# Patient Record
Sex: Male | Born: 1975 | Race: Black or African American | Hispanic: No | Marital: Single | Smoking: Current some day smoker
Health system: Southern US, Community
[De-identification: ages and names within clinical notes are randomized; demographics above are authoritative.]

## PROBLEM LIST (undated history)

## (undated) DIAGNOSIS — R05 Cough: Secondary | ICD-10-CM

## (undated) DIAGNOSIS — G4700 Insomnia, unspecified: Secondary | ICD-10-CM

## (undated) DIAGNOSIS — R569 Unspecified convulsions: Secondary | ICD-10-CM

## (undated) DIAGNOSIS — Z923 Personal history of irradiation: Secondary | ICD-10-CM

## (undated) DIAGNOSIS — Z86718 Personal history of other venous thrombosis and embolism: Secondary | ICD-10-CM

## (undated) DIAGNOSIS — C7931 Secondary malignant neoplasm of brain: Secondary | ICD-10-CM

## (undated) DIAGNOSIS — C801 Malignant (primary) neoplasm, unspecified: Secondary | ICD-10-CM

## (undated) DIAGNOSIS — C3490 Malignant neoplasm of unspecified part of unspecified bronchus or lung: Secondary | ICD-10-CM

## (undated) DIAGNOSIS — R0602 Shortness of breath: Secondary | ICD-10-CM

## (undated) DIAGNOSIS — Z9889 Other specified postprocedural states: Secondary | ICD-10-CM

## (undated) DIAGNOSIS — R059 Cough, unspecified: Secondary | ICD-10-CM

## (undated) HISTORY — DX: Malignant neoplasm of unspecified part of unspecified bronchus or lung: C34.90

## (undated) HISTORY — PX: MOUTH SURGERY: SHX715

## (undated) HISTORY — DX: Secondary malignant neoplasm of brain: C79.31

## (undated) HISTORY — DX: Malignant (primary) neoplasm, unspecified: C80.1

## (undated) HISTORY — DX: Personal history of irradiation: Z92.3

## (undated) HISTORY — DX: Other specified postprocedural states: Z98.890

## (undated) HISTORY — PX: FOOT SURGERY: SHX648

---

## 2012-04-13 DIAGNOSIS — Z9889 Other specified postprocedural states: Secondary | ICD-10-CM

## 2012-04-13 HISTORY — DX: Other specified postprocedural states: Z98.890

## 2012-04-14 ENCOUNTER — Emergency Department (HOSPITAL_COMMUNITY)
Admission: EM | Admit: 2012-04-14 | Discharge: 2012-04-15 | Disposition: A | Discharge status: Home / Self Care | Payer: 59 | Attending: Emergency Medicine | Admitting: Emergency Medicine

## 2012-04-14 ENCOUNTER — Encounter (HOSPITAL_COMMUNITY): Payer: Self-pay | Admitting: Emergency Medicine

## 2012-04-14 ENCOUNTER — Emergency Department (HOSPITAL_COMMUNITY): Payer: 59

## 2012-04-14 DIAGNOSIS — S0230XA Fracture of orbital floor, unspecified side, initial encounter for closed fracture: Secondary | ICD-10-CM | POA: Insufficient documentation

## 2012-04-14 DIAGNOSIS — IMO0002 Reserved for concepts with insufficient information to code with codable children: Secondary | ICD-10-CM | POA: Insufficient documentation

## 2012-04-14 DIAGNOSIS — Y939 Activity, unspecified: Secondary | ICD-10-CM | POA: Insufficient documentation

## 2012-04-14 DIAGNOSIS — R918 Other nonspecific abnormal finding of lung field: Secondary | ICD-10-CM

## 2012-04-14 DIAGNOSIS — R222 Localized swelling, mass and lump, trunk: Secondary | ICD-10-CM | POA: Insufficient documentation

## 2012-04-14 DIAGNOSIS — S02609B Fracture of mandible, unspecified, initial encounter for open fracture: Secondary | ICD-10-CM | POA: Insufficient documentation

## 2012-04-14 DIAGNOSIS — F17200 Nicotine dependence, unspecified, uncomplicated: Secondary | ICD-10-CM | POA: Insufficient documentation

## 2012-04-14 DIAGNOSIS — Y92009 Unspecified place in unspecified non-institutional (private) residence as the place of occurrence of the external cause: Secondary | ICD-10-CM | POA: Insufficient documentation

## 2012-04-14 MED ORDER — OXYCODONE-ACETAMINOPHEN 5-325 MG PO TABS
2.0000 | ORAL_TABLET | Freq: Once | ORAL | Status: AC
  Administered 2012-04-14: 2 via ORAL
  Filled 2012-04-14: qty 2
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
## 2012-04-15 ENCOUNTER — Emergency Department (HOSPITAL_COMMUNITY): Payer: 59

## 2012-04-15 LAB — POCT I-STAT, CHEM 8
Calcium, Ion: 1.19 mmol/L (ref 1.12–1.23)
Glucose, Bld: 98 mg/dL (ref 70–99)
HCT: 43 % (ref 39.0–52.0)
TCO2: 29 mmol/L (ref 0–100)

## 2012-04-15 MED ORDER — SODIUM CHLORIDE 0.9 % IV BOLUS (SEPSIS)
1000.0000 mL | Freq: Once | INTRAVENOUS | Status: AC
  Administered 2012-04-15: 1000 mL via INTRAVENOUS

## 2012-04-15 MED ORDER — ONDANSETRON HCL 4 MG/2ML IJ SOLN
4.0000 mg | Freq: Once | INTRAMUSCULAR | Status: AC
  Administered 2012-04-15: 4 mg via INTRAVENOUS
  Filled 2012-04-15: qty 2

## 2012-04-15 MED ORDER — CEPHALEXIN 500 MG PO CAPS: 500.0000 mg | ORAL_CAPSULE | Freq: Four times a day (QID) | ORAL | Status: DC

## 2012-04-15 MED ORDER — OXYCODONE-ACETAMINOPHEN 5-325 MG PO TABS: 1.0000 | ORAL_TABLET | Freq: Four times a day (QID) | ORAL | Status: DC | PRN

## 2012-04-15 MED ORDER — IOHEXOL 300 MG/ML  SOLN
80.0000 mL | Freq: Once | INTRAMUSCULAR | Status: AC | PRN
  Administered 2012-04-15: 80 mL via INTRAVENOUS

## 2012-04-15 MED ORDER — MORPHINE SULFATE 4 MG/ML IJ SOLN
4.0000 mg | Freq: Once | INTRAMUSCULAR | Status: AC
  Administered 2012-04-15: 4 mg via INTRAVENOUS
  Filled 2012-04-15: qty 1
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
## 2012-04-15 NOTE — ED Provider Notes (Signed)
Medical screening examination/treatment/procedure(s) were conducted as a shared visit with non-physician practitioner(s) and myself.  I personally evaluated the patient during the encounter  Please see my separate respective documentation pertaining to this patient encounter   Vida Roller, MD 04/15/12 [phone]

## 2012-04-16 ENCOUNTER — Other Ambulatory Visit: Payer: Self-pay | Admitting: Oncology

## 2012-04-22 ENCOUNTER — Telehealth: Payer: Self-pay | Admitting: *Deleted

## 2012-04-22 NOTE — Telephone Encounter (Signed)
Called pt with appt unable to leave message

## 2012-04-26 ENCOUNTER — Telehealth: Payer: Self-pay | Admitting: *Deleted

## 2012-04-26 NOTE — Telephone Encounter (Signed)
Spoke with pt regarding appt time and place.  He verbalized understanding 

## 2012-05-09 ENCOUNTER — Ambulatory Visit (INDEPENDENT_AMBULATORY_CARE_PROVIDER_SITE_OTHER): Payer: 59 | Admitting: Emergency Medicine

## 2012-05-09 ENCOUNTER — Encounter: Payer: Self-pay | Admitting: Emergency Medicine

## 2012-05-09 VITALS — BP 122/90 | HR 103 | Temp 98.7°F | Ht 71.0 in | Wt 136.6 lb

## 2012-05-09 DIAGNOSIS — R590 Localized enlarged lymph nodes: Secondary | ICD-10-CM

## 2012-05-09 DIAGNOSIS — R911 Solitary pulmonary nodule: Secondary | ICD-10-CM | POA: Insufficient documentation

## 2012-05-09 DIAGNOSIS — R599 Enlarged lymph nodes, unspecified: Secondary | ICD-10-CM

## 2012-05-09 MED ORDER — OXYCODONE-ACETAMINOPHEN 5-325 MG PO TABS: 1.0000 | ORAL_TABLET | Freq: Four times a day (QID) | ORAL | Status: DC | PRN
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
## 2012-05-09 NOTE — Assessment & Plan Note (Signed)
Should be able to reach by EBUS

## 2012-05-09 NOTE — Assessment & Plan Note (Addendum)
Suspicious for malignancy. Will attempt to make CT scan a superD study. He likely needs ENB and EBUS to get at the hilar nodes.  - will speak to his oral surgeon to see when it would be feasible to go to OR - will give a one-time script for roxicet - rov 1 month

## 2012-05-09 NOTE — Patient Instructions (Addendum)
We will discuss your case with Dr Chales Salmon regarding a possible bronchoscopy. We need to be sure you can open your mouth to have the procedure.  We will call you to set this up  We will discuss breathing testing at some point in the future You need to stop smoking A one-time script for pain medication was given for your chest and back.  Follow with Dr Delton Coombes in 1 month

## 2012-05-14 ENCOUNTER — Telehealth: Payer: Self-pay | Admitting: *Deleted

## 2012-05-14 ENCOUNTER — Telehealth: Payer: Self-pay | Admitting: Emergency Medicine

## 2012-05-14 NOTE — Telephone Encounter (Signed)
Dr. Mercer Pod office returned my phone call, and left msg with [facility]. Patient has OV with Dr. Chales Salmon Mar 10,2014 and Dr. Chales Salmon will see then about patient being able to do bronch/ett, would like Dr. Delton Coombes to hold off until then.  Will forward to RB as FYI

## 2012-05-14 NOTE — Telephone Encounter (Signed)
error 

## 2012-05-15 NOTE — Telephone Encounter (Signed)
Ok thank you 

## 2012-06-06 ENCOUNTER — Encounter: Payer: Self-pay | Admitting: Emergency Medicine

## 2012-06-06 ENCOUNTER — Ambulatory Visit (INDEPENDENT_AMBULATORY_CARE_PROVIDER_SITE_OTHER): Payer: 59 | Admitting: Emergency Medicine

## 2012-06-06 ENCOUNTER — Ambulatory Visit (INDEPENDENT_AMBULATORY_CARE_PROVIDER_SITE_OTHER)
Admission: RE | Admit: 2012-06-06 | Discharge: 2012-06-06 | Disposition: A | Discharge status: Home / Self Care | Payer: 59 | Source: Ambulatory Visit | Attending: Emergency Medicine | Admitting: Emergency Medicine

## 2012-06-06 VITALS — BP 138/98 | HR 85 | Temp 98.2°F | Ht 70.0 in | Wt 146.6 lb

## 2012-06-06 DIAGNOSIS — R911 Solitary pulmonary nodule: Secondary | ICD-10-CM

## 2012-06-06 NOTE — Assessment & Plan Note (Signed)
Discussed FOB + ENB and EBUS. I  believe this the best strategy to achieve tissue dx.  Will perform spiro prior to procedure to assess for occult AFL prior to anesthesia.  - need repeat CT scan, super D  - schedule procedure [facility]

## 2012-06-06 NOTE — Patient Instructions (Addendum)
We will repeat your CT scan of the chest  We will perform a bronchoscopy at [place]. This will be scheduled today Follow with Dr Delton Coombes in 1 month
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
## 2012-06-07 ENCOUNTER — Encounter (HOSPITAL_COMMUNITY): Payer: Self-pay | Admitting: Pharmacy Technician

## 2012-06-07 NOTE — Progress Notes (Signed)
MEDICATION RELATED CONSULT NOTE - INITIAL   Called Dr. Delton Coombes concerning patient's current pain medication regimen prior to his upcoming procedure with concerns of organ damage and bleeding complications. Dr. Delton Coombes very receptive and will check LFTs and clotting test prior to upcoming procedure.   Laqueisha Catalina S. Merilynn Finland, PharmD, BCPS Clinical Staff Pharmacist Pager [phone]

## 2012-06-11 DIAGNOSIS — C3490 Malignant neoplasm of unspecified part of unspecified bronchus or lung: Secondary | ICD-10-CM

## 2012-06-11 HISTORY — DX: Malignant neoplasm of unspecified part of unspecified bronchus or lung: C34.90

## 2012-06-18 ENCOUNTER — Encounter (HOSPITAL_COMMUNITY): Payer: Self-pay | Admitting: *Deleted

## 2012-06-18 NOTE — Progress Notes (Signed)
Pt doesn't have a cardiologist   Denies ever having an echo/stress test/heart cath  Doesn't have a Medical MD  Denies having an EKG in past yr

## 2012-06-19 ENCOUNTER — Ambulatory Visit (HOSPITAL_COMMUNITY)
Admission: RE | Admit: 2012-06-19 | Discharge: 2012-06-19 | Disposition: A | Discharge status: Home / Self Care | Payer: 59 | Source: Ambulatory Visit | Attending: Emergency Medicine | Admitting: Emergency Medicine

## 2012-06-19 ENCOUNTER — Encounter (HOSPITAL_COMMUNITY)
Admission: RE | Disposition: A | Discharge status: Home / Self Care | Payer: Self-pay | Source: Ambulatory Visit | Attending: Emergency Medicine

## 2012-06-19 ENCOUNTER — Encounter (HOSPITAL_COMMUNITY): Payer: Self-pay | Admitting: Anesthesiology

## 2012-06-19 ENCOUNTER — Encounter: Payer: Self-pay | Admitting: Emergency Medicine

## 2012-06-19 ENCOUNTER — Encounter (HOSPITAL_COMMUNITY): Payer: Self-pay | Admitting: Certified Registered"

## 2012-06-19 ENCOUNTER — Ambulatory Visit (HOSPITAL_COMMUNITY): Payer: 59

## 2012-06-19 ENCOUNTER — Ambulatory Visit (HOSPITAL_COMMUNITY): Payer: 59 | Admitting: Certified Registered"

## 2012-06-19 DIAGNOSIS — R911 Solitary pulmonary nodule: Secondary | ICD-10-CM | POA: Insufficient documentation

## 2012-06-19 DIAGNOSIS — R590 Localized enlarged lymph nodes: Secondary | ICD-10-CM

## 2012-06-19 DIAGNOSIS — C771 Secondary and unspecified malignant neoplasm of intrathoracic lymph nodes: Secondary | ICD-10-CM | POA: Insufficient documentation

## 2012-06-19 DIAGNOSIS — F17200 Nicotine dependence, unspecified, uncomplicated: Secondary | ICD-10-CM | POA: Insufficient documentation

## 2012-06-19 DIAGNOSIS — R599 Enlarged lymph nodes, unspecified: Secondary | ICD-10-CM

## 2012-06-19 HISTORY — PX: VIDEO BRONCHOSCOPY WITH ENDOBRONCHIAL ULTRASOUND: SHX6177

## 2012-06-19 HISTORY — DX: Shortness of breath: R06.02

## 2012-06-19 HISTORY — DX: Insomnia, unspecified: G47.00

## 2012-06-19 HISTORY — DX: Cough: R05

## 2012-06-19 HISTORY — DX: Cough, unspecified: R05.9

## 2012-06-19 LAB — CBC
HCT: 40.5 % (ref 39.0–52.0)
Platelets: 210 10*3/uL (ref 150–400)
RBC: 4.78 MIL/uL (ref 4.22–5.81)
RDW: 13.7 % (ref 11.5–15.5)
WBC: 6.4 10*3/uL (ref 4.0–10.5)

## 2012-06-19 LAB — BASIC METABOLIC PANEL
BUN: 18 mg/dL (ref 6–23)
CO2: 25 mEq/L (ref 19–32)
Chloride: 105 mEq/L (ref 96–112)
GFR calc Af Amer: >90 mL/min (ref >90)
Potassium: 3.9 mEq/L (ref 3.5–5.1)

## 2012-06-19 SURGERY — BRONCHOSCOPY, WITH EBUS
Anesthesia: General | Laterality: Right

## 2012-06-19 MED ORDER — OXYCODONE HCL 5 MG PO TABS: 5.0000 mg | ORAL_TABLET | Freq: Once | ORAL | Status: DC | PRN

## 2012-06-19 MED ORDER — NEOSTIGMINE METHYLSULFATE 1 MG/ML IJ SOLN
INTRAMUSCULAR | Status: DC | PRN
  Administered 2012-06-19: 3 mg via INTRAVENOUS

## 2012-06-19 MED ORDER — OXYCODONE HCL 5 MG/5ML PO SOLN: 5.0000 mg | Freq: Once | ORAL | Status: DC | PRN

## 2012-06-19 MED ORDER — ONDANSETRON HCL 4 MG/2ML IJ SOLN
INTRAMUSCULAR | Status: DC | PRN
  Administered 2012-06-19: 4 mg via INTRAVENOUS

## 2012-06-19 MED ORDER — LIDOCAINE HCL (CARDIAC) 20 MG/ML IV SOLN
INTRAVENOUS | Status: DC | PRN
  Administered 2012-06-19: 60 mg via INTRAVENOUS

## 2012-06-19 MED ORDER — MIDAZOLAM HCL 5 MG/5ML IJ SOLN
INTRAMUSCULAR | Status: DC | PRN
  Administered 2012-06-19: 2 mg via INTRAVENOUS

## 2012-06-19 MED ORDER — DEXAMETHASONE SODIUM PHOSPHATE 4 MG/ML IJ SOLN
INTRAMUSCULAR | Status: DC | PRN
  Administered 2012-06-19: 8 mg via INTRAVENOUS

## 2012-06-19 MED ORDER — PROPOFOL 10 MG/ML IV BOLUS
INTRAVENOUS | Status: DC | PRN
  Administered 2012-06-19: 200 mg via INTRAVENOUS

## 2012-06-19 MED ORDER — ARTIFICIAL TEARS OP OINT
TOPICAL_OINTMENT | OPHTHALMIC | Status: DC | PRN
  Administered 2012-06-19: 1 via OPHTHALMIC

## 2012-06-19 MED ORDER — HYDROMORPHONE HCL PF 1 MG/ML IJ SOLN: 0.2500 mg | INTRAMUSCULAR | Status: DC | PRN

## 2012-06-19 MED ORDER — LACTATED RINGERS IV SOLN
INTRAVENOUS | Status: DC | PRN
  Administered 2012-06-19: 08:00:00 via INTRAVENOUS

## 2012-06-19 MED ORDER — PROPOFOL INFUSION 10 MG/ML OPTIME
INTRAVENOUS | Status: DC | PRN
  Administered 2012-06-19: 50 ug/kg/min via INTRAVENOUS

## 2012-06-19 MED ORDER — GLYCOPYRROLATE 0.2 MG/ML IJ SOLN
INTRAMUSCULAR | Status: DC | PRN
  Administered 2012-06-19: 0.4 mg via INTRAVENOUS

## 2012-06-19 MED ORDER — ROCURONIUM BROMIDE 100 MG/10ML IV SOLN
INTRAVENOUS | Status: DC | PRN
  Administered 2012-06-19: 40 mg via INTRAVENOUS

## 2012-06-19 MED ORDER — FENTANYL CITRATE 0.05 MG/ML IJ SOLN
INTRAMUSCULAR | Status: DC | PRN
  Administered 2012-06-19: 100 ug via INTRAVENOUS

## 2012-06-19 MED ORDER — LIDOCAINE HCL 4 % MT SOLN
OROMUCOSAL | Status: DC | PRN
  Administered 2012-06-19: 4 mL via TOPICAL

## 2012-06-19 MED ORDER — IBUPROFEN 200 MG PO TABS: 600.0000 mg | ORAL_TABLET | Freq: Four times a day (QID) | ORAL | Status: DC | PRN

## 2012-06-19 MED ORDER — OXYCODONE HCL 5 MG PO TABS: 5.0000 mg | ORAL_TABLET | ORAL | Status: DC | PRN

## 2012-06-19 SURGICAL SUPPLY — 23 items
BRUSH CYTOL CELLEBRITY 1.5X140 (MISCELLANEOUS) IMPLANT
CANISTER SUCTION 2500CC (MISCELLANEOUS) ×2 IMPLANT
CLOTH BEACON ORANGE TIMEOUT ST (SAFETY) ×2 IMPLANT
CONT SPEC 4OZ CLIKSEAL STRL BL (MISCELLANEOUS) ×2 IMPLANT
COVER TABLE BACK 60X90 (DRAPES) ×2 IMPLANT
FORCEPS BIOP RJ4 1.8 (CUTTING FORCEPS) IMPLANT
GLOVE BIOGEL M STRL SZ7.5 (GLOVE) ×4 IMPLANT
GLOVE SURG SS PI 7.0 STRL IVOR (GLOVE) ×2 IMPLANT
GOWN STRL NON-REIN LRG LVL3 (GOWN DISPOSABLE) ×4 IMPLANT
KIT ROOM TURNOVER OR (KITS) ×2 IMPLANT
MARKER SKIN DUAL TIP RULER LAB (MISCELLANEOUS) ×2 IMPLANT
NEEDLE BIOPSY TRANSBRONCH 21G (NEEDLE) IMPLANT
NEEDLE SYS SONOTIP II EBUSTBNA (NEEDLE) ×4 IMPLANT
NS IRRIG 1000ML POUR BTL (IV SOLUTION) ×2 IMPLANT
OIL SILICONE PENTAX (PARTS (SERVICE/REPAIRS)) ×2 IMPLANT
PAD ARMBOARD 7.5X6 YLW CONV (MISCELLANEOUS) ×4 IMPLANT
SPONGE GAUZE 4X4 12PLY (GAUZE/BANDAGES/DRESSINGS) ×2 IMPLANT
SYR 20CC LL (SYRINGE) ×2 IMPLANT
SYR 20ML ECCENTRIC (SYRINGE) ×4 IMPLANT
SYR 5ML LUER SLIP (SYRINGE) ×2 IMPLANT
TOWEL OR 17X24 6PK STRL BLUE (TOWEL DISPOSABLE) ×2 IMPLANT
TRAP SPECIMEN MUCOUS 40CC (MISCELLANEOUS) ×2 IMPLANT
TUBE CONNECTING 12X1/4 (SUCTIONS) ×4 IMPLANT

## 2012-06-19 NOTE — Op Note (Signed)
Video Bronchoscopy with Endobronchial Ultrasound Procedure Note  Date of Operation: 06/19/2012  Pre-op Diagnosis: right upper lobe nodule with mediastinal lymphadenopathy  Post-op Diagnosis: same  Surgeon: Levy Pupa  Assistants: none  Anesthesia: General endotracheal anesthesia  Operation: Flexible video fiberoptic bronchoscopy with endobronchial ultrasound and biopsies.  Estimated Blood Loss: 25cc  Complications: none apparent  Indications and History: Terrance Clark is a 37 y.o. male with history of tobacco use, marijuana use, chronic back pain. He was referred for evaluation of an abnormal CT scan of the chest that showed a spiculated right apical nodule and mediastinal lymphadenopathy. Decision was made to pursue tissue diagnosis by video bronchoscopy with endobronchial ultrasound and biopsies.  The risks, benefits, complications, treatment options and expected outcomes were discussed with the patient.  The possibilities of pneumothorax, pneumonia, reaction to medication, pulmonary aspiration, perforation of a viscus, bleeding, failure to diagnose a condition and creating a complication requiring transfusion or operation were discussed with the patient who freely signed the consent.    Description of Procedure: The patient was examined in the preoperative area and history and data from the preprocedure consultation were reviewed. It was deemed appropriate to proceed.  The patient was taken to OR 10, identified as Terrance Clark and the procedure verified as Flexible Video Fiberoptic Bronchoscopy.  A Time Out was held and the above information confirmed. General anesthesia was initiated and the patient was orally intubated. The video fiberoptic bronchoscope was introduced via the endotracheal tube and a general inspection was performed which showed normal airways. The standard scope was then withdrawn and the endobronchial ultrasound was used to identify and characterize the peritracheal,  hilar and bronchial lymph nodes. Inspection showed significant enlargement of the station 4 R. And station 7 lymph nodes. The station 10 or lymph node was also enlarged to a lesser degree. The echotexture of all 3 nodes was abnormal with some apparent vascular structures. This was confirmed by Doppler ultrasound with evidence for small vessels coursing through the nodal tissue. Using real-time ultrasound guidance Wang needle biopsies were take from Station 4 R, 7 and 10 R nodes and were sent for cytology. Consistent with the vascular structures noted on upper ultrasound there was blood obtained on the Wang needle biopsies which would be atypical in normal nodal tissue. The patient tolerated the procedure well without apparent complications. There was no significant blood loss. The bronchoscope was withdrawn. Anesthesia was reversed and the patient was taken to the PACU for recovery.   Samples: 1. Wang needle biopsies from 4 R node 2. Wang needle biopsies from 7 node 3. Wang needle biopsies from 10 R node  Plans:  The patient will be discharged from the PACU to home when recovered from anesthesia. We will review the cytology results with the patient when they become available. Outpatient followup will be with Dr Delton Coombes.   Levy Pupa, MD, PhD 06/19/2012, 10:15 AM [facility] [phone] or if no answer [phone]

## 2012-06-19 NOTE — Preoperative (Signed)
Beta Blockers   Reason not to administer Beta Blockers:Not Applicable 
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
## 2012-06-19 NOTE — Anesthesia Preprocedure Evaluation (Addendum)
Anesthesia Evaluation  Patient identified by MRN, date of birth, ID band Patient awake    Reviewed: Allergy & Precautions, H&P , NPO status , Patient's Chart, lab work & pertinent test results  Airway Mallampati: II TM Distance: >3 FB Neck ROM: Full    Dental no notable dental hx. (+) Chipped, Teeth Intact and Dental Advisory Given,    Pulmonary shortness of breath, Current Smoker,  06-19-12 Chest X ray Comparison: CT chest 06/06/2012.   Findings: Trachea is midline.  Heart size normal.  Spiculated nodule in the apical right upper lobe is not well visualized. Lungs are hyperinflated but otherwise clear.  No pleural fluid.   IMPRESSION: Known spiculated right upper lobe nodule is poorly visualized.        Pulmonary exam normal       Cardiovascular Exercise Tolerance: Good negative cardio ROS  Rhythm:Regular     Neuro/Psych negative neurological ROS  negative psych ROS   GI/Hepatic negative GI ROS, Neg liver ROS,   Endo/Other  negative endocrine ROS  Renal/GU negative Renal ROS  negative genitourinary   Musculoskeletal negative musculoskeletal ROS (+)   Abdominal   Peds  Hematology negative hematology ROS (+)   Anesthesia Other Findings Gum Disease. Caries present.  Reproductive/Obstetrics negative OB ROS                        Anesthesia Physical Anesthesia Plan  ASA: II  Anesthesia Plan: General   Post-op Pain Management:    Induction: Intravenous  Airway Management Planned: Oral ETT  Additional Equipment:   Intra-op Plan:   Post-operative Plan: Extubation in OR  Informed Consent: I have reviewed the patients History and Physical, chart, labs and discussed the procedure including the risks, benefits and alternatives for the proposed anesthesia with the patient or authorized representative who has indicated his/her understanding and acceptance.   Dental advisory  given  Plan Discussed with: CRNA and Surgeon  Anesthesia Plan Comments:         Anesthesia Quick Evaluation

## 2012-06-19 NOTE — Interval H&P Note (Signed)
PCCM Note  Pt presents for EBUS. His RUL nodule is apical, in difficult position for navigation - no pathway available. He continues to have R chest and back pain to r shoulder. He continues to take high-dose ibuprofen, more than prescribed. He also uses Goody's Powders and BC Powders when he wants. He is not using tylenol at this time.   Filed Vitals:   06/19/12 0722  BP: 127/87  Pulse: 81  Temp: 98.2 F (36.8 C)  Resp: 20

## 2012-06-19 NOTE — Anesthesia Postprocedure Evaluation (Signed)
  Anesthesia Post-op Note  Patient: Terrance Clark  Procedure(s) Performed: Procedure(s): VIDEO BRONCHOSCOPY WITH ENDOBRONCHIAL ULTRASOUND (Right)  Patient Location: PACU  Anesthesia Type:General  Level of Consciousness: awake and alert   Airway and Oxygen Therapy: Patient Spontanous Breathing  Post-op Pain: mild  Post-op Assessment: Post-op Vital signs reviewed, Patient's Cardiovascular Status Stable, Respiratory Function Stable, Patent Airway and No signs of Nausea or vomiting  Post-op Vital Signs: Reviewed and stable  Complications: No apparent anesthesia complications

## 2012-06-19 NOTE — Interval H&P Note (Signed)
PLAN:  - will proceed with EBUS for nodal bx's, defer the ENB component of the procedure - will need to further w/u his significant CP; no reason to suspect that the CP is related to the nodule or to his LAD. He likely needs EGD >> at risk GU given the heavy ibuprofen use.  Levy Pupa, MD, PhD 06/19/2012, 8:40 AM [facility] [phone] or if no answer [phone]

## 2012-06-19 NOTE — Transfer of Care (Signed)
Immediate Anesthesia Transfer of Care Note  Patient: Terrance Clark  Procedure(s) Performed: Procedure(s): VIDEO BRONCHOSCOPY WITH ENDOBRONCHIAL ULTRASOUND (Right)  Patient Location: PACU  Anesthesia Type:General  Level of Consciousness: awake, alert , oriented and patient cooperative  Airway & Oxygen Therapy: Patient Spontanous Breathing and Patient connected to nasal cannula oxygen  Post-op Assessment: Report given to PACU RN and Post -op Vital signs reviewed and stable  Post vital signs: Reviewed and stable  Complications: No apparent anesthesia complications

## 2012-06-19 NOTE — Progress Notes (Signed)
Patient states he took 18 ibuprofen on yesterday due to the pain in his chest from the nodule. Rates pain a 8. Discussed safety with patient of taking too many ibuprofen. Dr. Sampson Goon aware.

## 2012-06-19 NOTE — Anesthesia Procedure Notes (Signed)
Procedure Name: Intubation Date/Time: 06/19/2012 8:57 AM Performed by: Tyrone Nine Pre-anesthesia Checklist: Patient identified, Timeout performed, Emergency Drugs available, Suction available and Patient being monitored Patient Re-evaluated:Patient Re-evaluated prior to inductionOxygen Delivery Method: Circle system utilized Preoxygenation: Pre-oxygenation with 100% oxygen Intubation Type: IV induction Ventilation: Oral airway inserted - appropriate to patient size and Mask ventilation without difficulty Tube size: 9.0 mm Number of attempts: 1 Airway Equipment and Method: Stylet Placement Confirmation: ETT inserted through vocal cords under direct vision,  breath sounds checked- equal and bilateral and positive ETCO2 Secured at: 23 cm Tube secured with: Tape Dental Injury: Teeth and Oropharynx as per pre-operative assessment

## 2012-06-20 ENCOUNTER — Encounter (HOSPITAL_COMMUNITY): Payer: Self-pay | Admitting: Emergency Medicine

## 2012-06-21 ENCOUNTER — Telehealth: Payer: Self-pay | Admitting: Emergency Medicine

## 2012-06-21 DIAGNOSIS — C3490 Malignant neoplasm of unspecified part of unspecified bronchus or lung: Secondary | ICD-10-CM

## 2012-06-21 NOTE — Telephone Encounter (Signed)
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 He understands the dx. I will refer him to [facility] to determine further management. Pt knows to expect call from our office about date and time.

## 2012-06-21 NOTE — Telephone Encounter (Signed)
Dr Delton Coombes,  Please advise pt on bronch results.

## 2012-06-24 ENCOUNTER — Telehealth: Payer: Self-pay | Admitting: Emergency Medicine

## 2012-06-24 NOTE — Telephone Encounter (Signed)
I spoke with pt. He awaiting a call back about appt for MTOC. He has not bene told anything. Please advise PCC's thanks

## 2012-06-24 NOTE — Telephone Encounter (Signed)
Spoke to pt appt 07/04/12@2 :00pm  Tobe Sos

## 2012-06-25 NOTE — Telephone Encounter (Signed)
See note 06/24/12

## 2012-06-28 ENCOUNTER — Telehealth: Payer: Self-pay | Admitting: *Deleted

## 2012-06-28 NOTE — Telephone Encounter (Signed)
Called pt regarding appt for mtoc and unable to leave message

## 2012-07-01 ENCOUNTER — Telehealth: Payer: Self-pay | Admitting: Emergency Medicine

## 2012-07-01 ENCOUNTER — Telehealth: Payer: Self-pay | Admitting: *Deleted

## 2012-07-01 NOTE — Telephone Encounter (Signed)
Tried to leave message for pt regarding appt unable.

## 2012-07-01 NOTE — Telephone Encounter (Signed)
Ok thank you 

## 2012-07-01 NOTE — Telephone Encounter (Signed)
Will forward to RB as FYI 

## 2012-07-02 ENCOUNTER — Telehealth: Payer: Self-pay | Admitting: *Deleted

## 2012-07-02 NOTE — Telephone Encounter (Signed)
Spoke with pt regarding appt for mtoc 07/04/12 at 2:00 arrive at 1:45.  He verbalized understanding of time and place of appt

## 2012-07-04 ENCOUNTER — Telehealth: Payer: Self-pay | Admitting: Internal Medicine

## 2012-07-04 ENCOUNTER — Ambulatory Visit (HOSPITAL_BASED_OUTPATIENT_CLINIC_OR_DEPARTMENT_OTHER): Payer: 59 | Admitting: Internal Medicine

## 2012-07-04 ENCOUNTER — Encounter: Payer: Self-pay | Admitting: *Deleted

## 2012-07-04 ENCOUNTER — Telehealth: Payer: Self-pay | Admitting: *Deleted

## 2012-07-04 ENCOUNTER — Encounter: Payer: Self-pay | Admitting: Radiation Oncology

## 2012-07-04 ENCOUNTER — Encounter: Payer: Self-pay | Admitting: Internal Medicine

## 2012-07-04 ENCOUNTER — Ambulatory Visit
Admission: RE | Admit: 2012-07-04 | Discharge: 2012-07-04 | Disposition: A | Discharge status: Home / Self Care | Payer: 59 | Source: Ambulatory Visit | Attending: Radiation Oncology | Admitting: Radiation Oncology

## 2012-07-04 VITALS — BP 152/93 | HR 72 | Temp 97.6°F | Resp 18 | Ht 70.0 in | Wt 146.0 lb

## 2012-07-04 DIAGNOSIS — C3491 Malignant neoplasm of unspecified part of right bronchus or lung: Secondary | ICD-10-CM

## 2012-07-04 DIAGNOSIS — C3490 Malignant neoplasm of unspecified part of unspecified bronchus or lung: Secondary | ICD-10-CM | POA: Insufficient documentation

## 2012-07-04 DIAGNOSIS — C3410 Malignant neoplasm of upper lobe, unspecified bronchus or lung: Secondary | ICD-10-CM

## 2012-07-04 NOTE — Telephone Encounter (Signed)
advised pt on 5.5.14 time change

## 2012-07-04 NOTE — Progress Notes (Signed)
   Thoracic Treatment Summary Name:Terrance Clark Date:07/04/12 DOB:1975/06/29 Your Medical Team Medical Oncologist:Dr. Arbutus Ped Radiation Oncologist:Dr. Kinard Pulmonologist:Dr. 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  Pathological stage will be determined after surgery.  Staging is based on the size of the tumor, involvement of lymph nodes or not, and whether or not the cancer center has spread. Recommendations Recommendations:Concurrent chemoradiation therapy.    These recommendations are based on information available as of today's consult.  This is subject to change depending further testing or exams. Next Steps Next Step: 1. Appointment 07/09/12 at 9:30 for radiation at [facility] 2. Cancer Center will call with appointment for chemo education class and Dr. Arbutus Ped  Barriers to Care What do you perceive as a potential barrier that may prevent you from receiving your treatment plan? Pt does not perceive any barriers to care at this time.  Information given and explained regarding lung cancer and supportive services at the Cancer Center Questions Willette Pa, RN BSN Thoracic Oncology Nurse Navigator at [phone]  Terrance Clark is a nurse navigator that is available to assist you through your cancer journey.  She can answer your questions and/or provide resources regarding your treatment plan, emotional support, or financial concerns.
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
## 2012-07-04 NOTE — Telephone Encounter (Signed)
Per staff message and POF I have scheduled appts.  JMW  

## 2012-07-04 NOTE — Telephone Encounter (Signed)
Terrance Clark and advised about 4.28.14 MRI, 5.1 chemo edu and 5.5.Marland KitchenMarland Kitchenpt ok and aware
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
## 2012-07-07 NOTE — Patient Instructions (Signed)
You are recently diagnosed with a stage IIIA non-small cell lung cancer. We discussed treatment options including concurrent chemotherapy and radiation.

## 2012-07-08 ENCOUNTER — Ambulatory Visit
Admission: RE | Admit: 2012-07-08 | Discharge: 2012-07-08 | Disposition: A | Discharge status: Home / Self Care | Payer: 59 | Source: Ambulatory Visit | Attending: Internal Medicine | Admitting: Internal Medicine

## 2012-07-08 DIAGNOSIS — C3491 Malignant neoplasm of unspecified part of right bronchus or lung: Secondary | ICD-10-CM

## 2012-07-08 MED ORDER — GADOBENATE DIMEGLUMINE 529 MG/ML IV SOLN
14.0000 mL | Freq: Once | INTRAVENOUS | Status: AC | PRN
  Administered 2012-07-08: 14 mL via INTRAVENOUS

## 2012-07-09 ENCOUNTER — Ambulatory Visit
Admission: RE | Admit: 2012-07-09 | Discharge: 2012-07-09 | Disposition: A | Discharge status: Home / Self Care | Payer: 59 | Source: Ambulatory Visit | Attending: Radiation Oncology | Admitting: Radiation Oncology

## 2012-07-09 ENCOUNTER — Other Ambulatory Visit: Payer: 59

## 2012-07-09 ENCOUNTER — Encounter: Payer: Self-pay | Admitting: Radiation Oncology

## 2012-07-09 ENCOUNTER — Other Ambulatory Visit: Payer: Self-pay | Admitting: Radiation Therapy

## 2012-07-09 VITALS — BP 163/104 | HR 77 | Temp 97.0°F | Resp 16 | Ht 71.0 in | Wt 145.2 lb

## 2012-07-09 DIAGNOSIS — K208 Other esophagitis without bleeding: Secondary | ICD-10-CM | POA: Insufficient documentation

## 2012-07-09 DIAGNOSIS — R042 Hemoptysis: Secondary | ICD-10-CM | POA: Insufficient documentation

## 2012-07-09 DIAGNOSIS — C7931 Secondary malignant neoplasm of brain: Secondary | ICD-10-CM | POA: Insufficient documentation

## 2012-07-09 DIAGNOSIS — C3491 Malignant neoplasm of unspecified part of right bronchus or lung: Secondary | ICD-10-CM

## 2012-07-09 DIAGNOSIS — R634 Abnormal weight loss: Secondary | ICD-10-CM | POA: Insufficient documentation

## 2012-07-09 DIAGNOSIS — C3490 Malignant neoplasm of unspecified part of unspecified bronchus or lung: Secondary | ICD-10-CM | POA: Insufficient documentation

## 2012-07-09 DIAGNOSIS — R5383 Other fatigue: Secondary | ICD-10-CM | POA: Insufficient documentation

## 2012-07-09 DIAGNOSIS — Z79899 Other long term (current) drug therapy: Secondary | ICD-10-CM | POA: Insufficient documentation

## 2012-07-09 DIAGNOSIS — R42 Dizziness and giddiness: Secondary | ICD-10-CM | POA: Insufficient documentation

## 2012-07-09 DIAGNOSIS — Z510 Encounter for antineoplastic radiation therapy: Secondary | ICD-10-CM | POA: Insufficient documentation

## 2012-07-09 DIAGNOSIS — Y842 Radiological procedure and radiotherapy as the cause of abnormal reaction of the patient, or of later complication, without mention of misadventure at the time of the procedure: Secondary | ICD-10-CM | POA: Insufficient documentation

## 2012-07-09 DIAGNOSIS — C7949 Secondary malignant neoplasm of other parts of nervous system: Secondary | ICD-10-CM

## 2012-07-09 DIAGNOSIS — J029 Acute pharyngitis, unspecified: Secondary | ICD-10-CM | POA: Insufficient documentation

## 2012-07-09 DIAGNOSIS — R5381 Other malaise: Secondary | ICD-10-CM | POA: Insufficient documentation

## 2012-07-09 DIAGNOSIS — R1310 Dysphagia, unspecified: Secondary | ICD-10-CM | POA: Insufficient documentation

## 2012-07-09 DIAGNOSIS — R112 Nausea with vomiting, unspecified: Secondary | ICD-10-CM | POA: Insufficient documentation

## 2012-07-09 NOTE — Progress Notes (Signed)
Patient presented to the clinic today accompanied by his brother for nurse evaluation prior to ct/simulation. Patient alert and oriented to person, place, and time. No distress noted. Steady gait noted. Pleasant affect noted. Patient reports right upper chest pain 5 on a scale of 0-10 for which he takes motrin. Patient denies cough, shortness of breath, or hemoptysis. Patient denies difficulty swallowing. Patient denies headache, dizziness, diplopia, nausea, or vomiting. Patient seen by Dr. Roselind Messier to review MRI results. Reported all findings to Dr. Roselind Messier.

## 2012-07-09 NOTE — Progress Notes (Signed)
Met with patient to discuss RO billing.  Patient had no concerns today;however, if he decides he needs FMLA, disability paperwork, will get back with me at a later date.

## 2012-07-09 NOTE — Addendum Note (Signed)
Encounter addended by: Delynn Flavin, RN on: 07/09/2012 10:47 AM<BR>     Documentation filed: Charges VN

## 2012-07-10 ENCOUNTER — Ambulatory Visit (HOSPITAL_COMMUNITY)
Admission: RE | Admit: 2012-07-10 | Discharge: 2012-07-10 | Disposition: A | Discharge status: Home / Self Care | Payer: 59 | Source: Ambulatory Visit | Attending: Internal Medicine | Admitting: Internal Medicine

## 2012-07-10 ENCOUNTER — Ambulatory Visit
Admission: RE | Admit: 2012-07-10 | Discharge: 2012-07-10 | Disposition: A | Discharge status: Home / Self Care | Payer: 59 | Source: Ambulatory Visit | Attending: Radiation Oncology | Admitting: Radiation Oncology

## 2012-07-10 DIAGNOSIS — R911 Solitary pulmonary nodule: Secondary | ICD-10-CM | POA: Insufficient documentation

## 2012-07-10 DIAGNOSIS — C771 Secondary and unspecified malignant neoplasm of intrathoracic lymph nodes: Secondary | ICD-10-CM | POA: Insufficient documentation

## 2012-07-10 DIAGNOSIS — C3491 Malignant neoplasm of unspecified part of right bronchus or lung: Secondary | ICD-10-CM

## 2012-07-10 DIAGNOSIS — C7931 Secondary malignant neoplasm of brain: Secondary | ICD-10-CM

## 2012-07-10 DIAGNOSIS — C3490 Malignant neoplasm of unspecified part of unspecified bronchus or lung: Secondary | ICD-10-CM | POA: Insufficient documentation

## 2012-07-10 DIAGNOSIS — C770 Secondary and unspecified malignant neoplasm of lymph nodes of head, face and neck: Secondary | ICD-10-CM | POA: Insufficient documentation

## 2012-07-10 LAB — GLUCOSE, CAPILLARY: Glucose-Capillary: 95 mg/dL (ref 70–99)

## 2012-07-10 MED ORDER — FLUDEOXYGLUCOSE F - 18 (FDG) INJECTION
19.8000 | Freq: Once | INTRAVENOUS | Status: AC | PRN
  Administered 2012-07-10: 19.8 via INTRAVENOUS

## 2012-07-10 MED ORDER — GADOBENATE DIMEGLUMINE 529 MG/ML IV SOLN
14.0000 mL | Freq: Once | INTRAVENOUS | Status: AC | PRN
  Administered 2012-07-10: 14 mL via INTRAVENOUS

## 2012-07-10 NOTE — Progress Notes (Signed)
  Radiation Oncology         [phone]) [phone] ________________________________  Name: Terrance Clark MRN: [id number]  Date: 07/09/2012  DOB: 1975-07-07  SIMULATION AND TREATMENT PLANNING NOTE  DIAGNOSIS:  Clinical stage III non-small cell lung  NARRATIVE:  The patient was brought to the CT Simulation planning suite.  Identity was confirmed.  All relevant records and images related to the planned course of therapy were reviewed.  The patient freely provided informed written consent to proceed with treatment after reviewing the details related to the planned course of therapy. The consent form was witnessed and verified by the simulation staff.  Then, the patient was set-up in a stable reproducible  supine position for radiation therapy.  CT images were obtained.  Surface markings were placed.  The CT images were loaded into the planning software.  Then the target and avoidance structures were contoured.  Treatment planning then occurred.  The radiation prescription was entered and confirmed.  Then, I designed and supervised the construction of a total of 1 medically necessary complex treatment devices.  I have requested : Intensity Modulated Radiotherapy (IMRT) is medically necessary for this case for the following reason:  Spinal cord adjacent to treatment area.  I have ordered:dose calc.  PLAN:  The patient will receive 63 Gy in 35 fractions.  ________________________________   Special treatment procedure note  The patient will be receiving radiosensitizing chemotherapy throughout his course of treatment. Given the increased potential for toxicities as well as the necessity for close monitoring of the patient and bloodwork, this constitutes a special treatment procedure. -----------------------------------  Billie Lade, PhD, MD

## 2012-07-11 ENCOUNTER — Ambulatory Visit
Admission: RE | Admit: 2012-07-11 | Discharge: 2012-07-11 | Disposition: A | Discharge status: Home / Self Care | Payer: 59 | Source: Ambulatory Visit | Attending: Radiation Oncology | Admitting: Radiation Oncology

## 2012-07-11 ENCOUNTER — Encounter: Payer: Self-pay | Admitting: *Deleted

## 2012-07-11 ENCOUNTER — Encounter: Payer: Self-pay | Admitting: Radiation Oncology

## 2012-07-11 ENCOUNTER — Other Ambulatory Visit: Payer: 59

## 2012-07-11 VITALS — BP 140/104 | HR 100 | Temp 98.7°F | Resp 20

## 2012-07-11 DIAGNOSIS — C7949 Secondary malignant neoplasm of other parts of nervous system: Secondary | ICD-10-CM | POA: Insufficient documentation

## 2012-07-11 DIAGNOSIS — C3490 Malignant neoplasm of unspecified part of unspecified bronchus or lung: Secondary | ICD-10-CM | POA: Insufficient documentation

## 2012-07-11 DIAGNOSIS — C7931 Secondary malignant neoplasm of brain: Secondary | ICD-10-CM

## 2012-07-11 DIAGNOSIS — C3491 Malignant neoplasm of unspecified part of right bronchus or lung: Secondary | ICD-10-CM

## 2012-07-11 HISTORY — DX: Secondary malignant neoplasm of brain: C79.31

## 2012-07-11 MED ORDER — OXYCODONE HCL 5 MG PO TABS: 5.0000 mg | ORAL_TABLET | ORAL | Status: DC | PRN

## 2012-07-11 NOTE — Progress Notes (Unsigned)
Location/Histology of Brain Tumor: 2right parietal , 1right posterior cortex Patient presented with symptoms of:  Chest pain/dioscomfort Past or anticipated interventions, if any, per neurosurgeryno Past or anticipated interventions, if any, per medical oncology:no Dose of Decadron, if applicable: none  Recent neurologic symptoms, if any:   Seizures: no  Headaches: no  Nausea: no  Dizziness/ataxia: no  Difficulty with hand coordination: no  Focal numbness/weakness: no  Visual deficits/changes: no  Confusion/Memory deficits: no  Painful bone metastases at present, if any: n, new dx Lung cancer, with brain mets  SAFETY ISSUES:  Prior radiation? no  Pacemaker/ICD? no  Possible current pregnancy? n/a  Is the patient on methotrexate? no  Additional Complaints / other details: new dx Lung cancer 06/19/12, 07/11/11 MRI 3 brain metastases, c/o chest discomfort only takes Ibuprofen, ran out of OXY IR 5mg  prn,  NKDA
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
## 2012-07-11 NOTE — Progress Notes (Signed)
Location/Histology of Brain Tumor:   Patient presented with symptoms of:    Past or anticipated interventions, if any, per neurosurgery:   Past or anticipated interventions, if any, per medical oncology:   Dose of Decadron, if applicable: no Recent neurologic symptoms, if any:   Seizures:   Headaches:   Nausea:   Dizziness/ataxia:   Difficulty with hand coordination:   Focal numbness/weakness:   Visual deficits/changes:   Confusion/Memory deficits:    Painful bone metastases at present, if any:   SAFETY ISSUES:  Prior radiation? No   Pacemaker/ICD? no  Possible current pregnancy? n/a  Is the patient on methotrexate? no  Additional Complaints / other details: new dx lung cancer with brain mets, anxiety, has chest pain, only taking ibuprofen, ran out of oxy IR5mg 

## 2012-07-12 ENCOUNTER — Ambulatory Visit
Admission: RE | Admit: 2012-07-12 | Discharge: 2012-07-12 | Disposition: A | Discharge status: Home / Self Care | Payer: 59 | Source: Ambulatory Visit | Attending: Radiation Oncology | Admitting: Radiation Oncology

## 2012-07-12 DIAGNOSIS — C7931 Secondary malignant neoplasm of brain: Secondary | ICD-10-CM

## 2012-07-12 DIAGNOSIS — C7949 Secondary malignant neoplasm of other parts of nervous system: Secondary | ICD-10-CM

## 2012-07-12 MED ORDER — SODIUM CHLORIDE 0.9 % IJ SOLN
10.0000 mL | Freq: Once | INTRAMUSCULAR | Status: AC
  Administered 2012-07-12: 10 mL via INTRAVENOUS

## 2012-07-12 NOTE — Progress Notes (Signed)
  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  NARRATIVE:  The patient was brought to the CT Simulation planning suite.  Identity was confirmed.  All relevant records and images related to the planned course of therapy were reviewed.  The patient freely provided informed written consent to proceed with treatment after reviewing the details related to the planned course of therapy. The consent form was witnessed and verified by the simulation staff. Intravenous access was established for contrast administration. Then, the patient was set-up in a stable reproducible supine position for radiation therapy.  A relocatable thermoplastic stereotactic head frame was fabricated for precise immobilization.  CT images were obtained.  Surface markings were placed.  The CT images were loaded into the planning software and fused with the patient's targeting MRI scan.  Then the target and avoidance structures were contoured.  Treatment planning then occurred.  The radiation prescription was entered and confirmed.  I have requested 3D planning  I have requested a DVH of the following structures: Brain stem, brain, left eye, right I, lenses, optic chiasm, target volumes, uninvolved brain, and normal tissue.    PLAN:  The patient will receive 20 Gy in one fraction.  ________________________________  [name], M.D.

## 2012-07-12 NOTE — Progress Notes (Signed)
Patient arrived ambultory for IV start ,gave name and dob as identification, no allergies,not diabetic,labs 06/06/12 BUn=18,CR=0.86 wnl, , started Iv left antecubital x 1 attempt,#22g 1 inch catheter needle, had excellent blood return, flushed with 10cc ns, applied opsite over site, patient tolerated well 9:44 AM

## 2012-07-12 NOTE — Progress Notes (Signed)
Patient presented to the clinic today following simulation. Patient alert and oriented to person, place, and time. No distress noted. Steady gait noted. Pleasant affect noted. Patient denies pain at this time. Removed left AC 22 gauge IV. Catheter intact upon removal. Applied a bandaid to old IV site. Patient tolerated well. Reported all findings to Dr. Kathrynn Running.

## 2012-07-15 ENCOUNTER — Other Ambulatory Visit (HOSPITAL_BASED_OUTPATIENT_CLINIC_OR_DEPARTMENT_OTHER): Payer: 59 | Admitting: Lab

## 2012-07-15 ENCOUNTER — Ambulatory Visit (HOSPITAL_BASED_OUTPATIENT_CLINIC_OR_DEPARTMENT_OTHER): Payer: 59

## 2012-07-15 ENCOUNTER — Other Ambulatory Visit: Payer: 59 | Admitting: Lab

## 2012-07-15 ENCOUNTER — Telehealth: Payer: Self-pay | Admitting: *Deleted

## 2012-07-15 ENCOUNTER — Other Ambulatory Visit: Payer: Self-pay | Admitting: Internal Medicine

## 2012-07-15 ENCOUNTER — Other Ambulatory Visit: Payer: Self-pay | Admitting: *Deleted

## 2012-07-15 VITALS — BP 137/97 | HR 71 | Temp 98.1°F

## 2012-07-15 DIAGNOSIS — C3410 Malignant neoplasm of upper lobe, unspecified bronchus or lung: Secondary | ICD-10-CM

## 2012-07-15 DIAGNOSIS — Z5111 Encounter for antineoplastic chemotherapy: Secondary | ICD-10-CM

## 2012-07-15 DIAGNOSIS — C3492 Malignant neoplasm of unspecified part of left bronchus or lung: Secondary | ICD-10-CM

## 2012-07-15 DIAGNOSIS — C3491 Malignant neoplasm of unspecified part of right bronchus or lung: Secondary | ICD-10-CM

## 2012-07-15 DIAGNOSIS — C3490 Malignant neoplasm of unspecified part of unspecified bronchus or lung: Secondary | ICD-10-CM

## 2012-07-15 LAB — COMPREHENSIVE METABOLIC PANEL (CC13)
AST: 12 U/L (ref 5–34)
Albumin: 3.9 g/dL (ref 3.5–5.0)
Alkaline Phosphatase: 117 U/L (ref 40–150)
BUN: 12.4 mg/dL (ref 7.0–26.0)
Creatinine: 0.8 mg/dL (ref 0.7–1.3)
Glucose: 97 mg/dl (ref 70–99)
Potassium: 3.2 mEq/L — ABNORMAL LOW (ref 3.5–5.1)
Total Bilirubin: 0.38 mg/dL (ref 0.20–1.20)

## 2012-07-15 LAB — CBC WITH DIFFERENTIAL/PLATELET
BASO%: 0.3 % (ref 0.0–2.0)
EOS%: 2.5 % (ref 0.0–7.0)
HCT: 40.3 % (ref 38.4–49.9)
MCH: 28.8 pg (ref 27.2–33.4)
MCHC: 33.7 g/dL (ref 32.0–36.0)
MCV: 85.2 fL (ref 79.3–98.0)
MONO%: 12.1 % (ref 0.0–14.0)
NEUT%: 53.6 % (ref 39.0–75.0)
lymph#: 2 10*3/uL (ref 0.9–3.3)

## 2012-07-15 MED ORDER — SODIUM CHLORIDE 0.9 % IV SOLN
Freq: Once | INTRAVENOUS | Status: AC
  Administered 2012-07-15: 09:00:00 via INTRAVENOUS

## 2012-07-15 MED ORDER — SODIUM CHLORIDE 0.9 % IV SOLN
710.0000 mg | Freq: Once | INTRAVENOUS | Status: AC
  Administered 2012-07-15: 710 mg via INTRAVENOUS
  Filled 2012-07-15: qty 71

## 2012-07-15 MED ORDER — SODIUM CHLORIDE 0.9 % IV SOLN
500.0000 mg/m2 | Freq: Once | INTRAVENOUS | Status: AC
  Administered 2012-07-15: 900 mg via INTRAVENOUS
  Filled 2012-07-15: qty 36

## 2012-07-15 MED ORDER — DEXAMETHASONE SODIUM PHOSPHATE 20 MG/5ML IJ SOLN
20.0000 mg | Freq: Once | INTRAMUSCULAR | Status: AC
  Administered 2012-07-15: 20 mg via INTRAVENOUS

## 2012-07-15 MED ORDER — FOLIC ACID 1 MG PO TABS: 1.0000 mg | ORAL_TABLET | Freq: Every day | ORAL | Status: DC

## 2012-07-15 MED ORDER — POTASSIUM CHLORIDE CRYS ER 20 MEQ PO TBCR: 20.0000 meq | EXTENDED_RELEASE_TABLET | Freq: Every day | ORAL | Status: DC

## 2012-07-15 MED ORDER — DEXAMETHASONE 4 MG PO TABS: 4.0000 mg | ORAL_TABLET | ORAL | Status: DC

## 2012-07-15 MED ORDER — PROCHLORPERAZINE MALEATE 10 MG PO TABS: 10.0000 mg | ORAL_TABLET | Freq: Four times a day (QID) | ORAL | Status: DC | PRN

## 2012-07-15 MED ORDER — ONDANSETRON 16 MG/50ML IVPB (CHCC)
16.0000 mg | Freq: Once | INTRAVENOUS | Status: AC
  Administered 2012-07-15: 16 mg via INTRAVENOUS

## 2012-07-15 MED ORDER — CYANOCOBALAMIN 1000 MCG/ML IJ SOLN
1000.0000 ug | Freq: Once | INTRAMUSCULAR | Status: AC
  Administered 2012-07-15: 1000 ug via INTRAMUSCULAR

## 2012-07-15 NOTE — Progress Notes (Signed)
Quick Note:  Call patient with the result and order K Dur 20 meq po qd X 7 days ______ 

## 2012-07-15 NOTE — Patient Instructions (Signed)
[facility] Discharge Instructions for Patients Receiving Chemotherapy  Today you received the following chemotherapy agents Alimta, Carboplatin  To help prevent nausea and vomiting after your treatment, we encourage you to take your nausea medication Prochlorperazine 10 mg Begin taking it at anytime upon discharge and take it as often as prescribed for the next 72 hours and as needed.   Take second dose of dexamethasone today and take two doses tomorrow with breakfast and lunch or at least saltines to have food on your stomach.   If you develop nausea and vomiting that is not controlled by your nausea medication, call the clinic. If it is after clinic hours your family physician or the after hours number for the clinic or go to the Emergency Department.   BELOW ARE SYMPTOMS THAT SHOULD BE REPORTED IMMEDIATELY:  *FEVER GREATER THAN 100.5 F  *CHILLS WITH OR WITHOUT FEVER  NAUSEA AND VOMITING THAT IS NOT CONTROLLED WITH YOUR NAUSEA MEDICATION  *UNUSUAL SHORTNESS OF BREATH  *UNUSUAL BRUISING OR BLEEDING  TENDERNESS IN MOUTH AND THROAT WITH OR WITHOUT PRESENCE OF ULCERS  *URINARY PROBLEMS  *BOWEL PROBLEMS  UNUSUAL RASH Items with * indicate a potential emergency and should be followed up as soon as possible.  One of the nurses will contact you 24 hours after your treatment. Please let the nurse know about any problems that you may have experienced. Feel free to call the clinic you have any questions or concerns. The clinic phone number is [phone].   I have been informed and understand all the instructions given to me. I know to contact the clinic, my physician, or go to the Emergency Department if any problems should occur. I do not have any questions at this time, but understand that I may call the clinic during office hours   should I have any questions or need assistance in obtaining follow up care.    __________________________________________   _____________  __________ Signature of Patient or Authorized Representative            Date                   Time    __________________________________________ Nurse's Signature

## 2012-07-15 NOTE — Telephone Encounter (Signed)
Message copied by Cooper Render on Mon Jul 15, 2012  4:33 PM ------      Message from: Caren Griffins      Created: Mon Jul 15, 2012  4:15 PM                   ----- Message -----         From: Si Gaul, MD         Sent: 07/15/2012  10:32 AM           To: Conni Slipper, PA-C, #            Call patient with the result and order K Dur 20 meq po qd X 7 days ------

## 2012-07-15 NOTE — Progress Notes (Signed)
Patient left at 1225, ambulatory with his brother and girlfriend was here also.  Denies questions.

## 2012-07-15 NOTE — Telephone Encounter (Signed)
Called pt to notify K+ 3.2. Message on phone " this subscriber cannot receive messages at this time."  Called again, spoke to pt advised lab results and instructions to take K-Dur po qd x 7 days starting today , rx sent to pt pharmacy. Pt verbalized understanding.

## 2012-07-16 ENCOUNTER — Ambulatory Visit
Admission: RE | Admit: 2012-07-16 | Discharge: 2012-07-16 | Disposition: A | Discharge status: Home / Self Care | Payer: 59 | Source: Ambulatory Visit | Attending: Radiation Oncology | Admitting: Radiation Oncology

## 2012-07-16 ENCOUNTER — Telehealth: Payer: Self-pay | Admitting: *Deleted

## 2012-07-16 VITALS — BP 149/95 | HR 86 | Temp 98.3°F | Resp 20 | Wt 141.2 lb

## 2012-07-16 DIAGNOSIS — C3491 Malignant neoplasm of unspecified part of right bronchus or lung: Secondary | ICD-10-CM

## 2012-07-16 NOTE — Progress Notes (Addendum)
Post sim ed completed w/pt and girlfriend, charted under pt education appt. Pt had first chemo yesterday, states he has had n/v since this morning. He has taken Compazine x 3 w/fair relief. He states he has been able to drink decaf coffee. Advised pt try taking Compazine on Sunday prior to chemo each Monday and continue until Tues to try and control nausea.  Pt denies other pain, SOB, cough, fatigue, loss of appetite. He continues to work full time.

## 2012-07-16 NOTE — Progress Notes (Signed)
Post sim ed completed w/pt and girlfriend. Gave pt "Radiaiton and You " booklet w/all pertinent information marked and discussed, re: fatigue, skin irritation/care, nutrition, pain, throat irritation/care. Reviewd list in back of book re: foods/drinks for pt's with nausea. Discussed controlling nausea by taking Compazine starting Sun before each chemo every Mon, continuing Compazine through Tues. Pt verbalized understanding. No questions verbalized.

## 2012-07-16 NOTE — Progress Notes (Signed)
[facility]    Radiation Oncology [address] [place]     Maryln Gottron, M.D. [place], [place] [address]               Billie Lade, M.D., Ph.D. Phone: [phone]      Molli Hazard A. Kathrynn Running, M.D. Fax: [phone]      Radene Gunning, M.D., Ph.D.         Lurline Hare, M.D.         Grayland Jack, M.D Weekly Treatment Management Note  Name: Terrance Clark     MRN: [id number]        CSN: [id number] Date: 07/16/2012      DOB: [birth year]  CC: Default, Provider, MD         Donata Clay    Status: Outpatient  Diagnosis: The encounter diagnosis was Non-small cell carcinoma of lung, right.  Current Dose: 1.8 Gy  Current Fraction: 1  Planned Dose: 63 Gy  Narrative: Terrance Clark was seen today for weekly treatment management. The chart was checked and CBCT  were reviewed. He is tolerating his radiation therapy well thus far. Patient however has had some nausea with his first cycle of chemotherapy yesterday.  Review of patient's allergies indicates no known allergies.  Current Outpatient Prescriptions  Medication Sig Dispense Refill  . dexamethasone (DECADRON) 4 MG tablet Take 1 tablet (4 mg total) by mouth as directed. 1 tablet twice daily day before, day of, day after chemo  40 tablet  1  . folic acid (FOLVITE) 1 MG tablet Take 1 tablet (1 mg total) by mouth daily.  30 tablet  5  . ibuprofen (ADVIL,MOTRIN) 200 MG tablet Take 3 tablets (600 mg total) by mouth every 6 (six) hours as needed for pain.  30 tablet  0  . potassium chloride SA (K-DUR,KLOR-CON) 20 MEQ tablet Take 1 tablet (20 mEq total) by mouth daily.  7 tablet  0  . prochlorperazine (COMPAZINE) 10 MG tablet Take 1 tablet (10 mg total) by mouth every 6 (six) hours as needed (nausea and vomiting).  30 tablet  1  . oxyCODONE (ROXICODONE) 5 MG immediate release tablet Take 1-2 tablets (5-10 mg total) by mouth every 4 (four) hours as needed for pain.  120 tablet  0   No current facility-administered medications for  this encounter.   Labs:  Lab Results  Component Value Date   WBC 6.3 07/15/2012   HGB 13.6 07/15/2012   HCT 40.3 07/15/2012   MCV 85.2 07/15/2012   PLT 184 07/15/2012   Lab Results  Component Value Date   CREATININE 0.8 07/15/2012   BUN 12.4 07/15/2012   NA 139 07/15/2012   K 3.2* 07/15/2012   CL 103 07/15/2012   CO2 25 07/15/2012   Lab Results  Component Value Date   ALT 7 07/15/2012   AST 12 07/15/2012   BILITOT 0.38 07/15/2012    Physical Examination:  weight is 141 lb 3.2 oz (64.048 kg). His temperature is 98.3 F (36.8 C). His blood pressure is 149/95 and his pulse is 86. His respiration is 20 and oxygen saturation is 100%.    Wt Readings from Last 3 Encounters:  07/16/12 141 lb 3.2 oz (64.048 kg)  07/09/12 145 lb 3.2 oz (65.862 kg)  07/04/12 146 lb (66.225 kg)    The oral cavity is moist without secondary infection.  palpation along the supraclavicular area reveals shoddy adenopathy. Lungs - Normal respiratory effort, chest expands symmetrically. Lungs are clear  to auscultation, no crackles or wheezes.  Heart has regular rhythm and rate  Abdomen is soft and non tender with normal bowel sounds  Assessment:  Patient tolerating treatments well  Plan: Continue treatment per original radiation prescription.  The patient will receive his SRS treatment for his 3 brain metastasis on may 8.

## 2012-07-16 NOTE — Telephone Encounter (Signed)
Attempted to speak with patient regarding chemo from yesterday, due to poor cell service and connection, unable to hear patient. Attempted 2 more times, no voicemail to leave message.

## 2012-07-17 ENCOUNTER — Ambulatory Visit
Admission: RE | Admit: 2012-07-17 | Discharge: 2012-07-17 | Disposition: A | Discharge status: Home / Self Care | Payer: 59 | Source: Ambulatory Visit | Attending: Radiation Oncology | Admitting: Radiation Oncology

## 2012-07-17 ENCOUNTER — Telehealth: Payer: Self-pay | Admitting: *Deleted

## 2012-07-17 VITALS — BP 150/107 | HR 79 | Temp 98.3°F | Resp 20 | Wt 138.3 lb

## 2012-07-17 DIAGNOSIS — C3491 Malignant neoplasm of unspecified part of right bronchus or lung: Secondary | ICD-10-CM

## 2012-07-17 MED ORDER — LORAZEPAM 0.5 MG PO TABS: 0.5000 mg | ORAL_TABLET | Freq: Three times a day (TID) | ORAL | Status: DC

## 2012-07-17 MED ORDER — LORAZEPAM 0.5 MG PO TABS
0.5000 mg | ORAL_TABLET | Freq: Once | ORAL | Status: AC
  Administered 2012-07-17: 0.5 mg via SUBLINGUAL
  Filled 2012-07-17: qty 1

## 2012-07-17 NOTE — Telephone Encounter (Signed)
Called patient home phone let ring 25 times, no answer no voice mail to leage message,called brrother phone, layfette, he stated he would call Winter cehck on him and have agary to call us, informed Layfette we have not called in a Rx for the patient, we needed to see if ativan had helped his nausea before filling the rx, Layfette said he would give message to Dewan once he had gotten in touch with him. 3:19 PM

## 2012-07-17 NOTE — Progress Notes (Signed)
Patient arrived to nursing after rad tx, c/o emesis  Yesterday x 3 and x3 this am, cannot keep anything down, did keep compazine and other meds down after he drank ensure , still nauseated Unable to eat foodtook ortho vitals, sitting vitals: T=98.3, b/p=143/99,p=78,rr=20 Standing b/p=150/107,p=79, rr=20 weight loss in 1 day 3 lbs,requesting something stronger nauea medication other than compazine 11:17 AM  .

## 2012-07-17 NOTE — Progress Notes (Addendum)
0.5mg  ativan sl given written order by Md after patient gave name and dob as identification, verified ativan with Alphia Moh, , no driving  Today told to patient, girlfriend to drive him home, patient gave verbal understanding, gingerale offered, patient toklerated at room temperature 11:43 AM

## 2012-07-17 NOTE — Progress Notes (Signed)
  Radiation Oncology         (336) [phone] ________________________________  Name: Terrance Clark MRN: [id number]  Date: 07/17/2012  DOB: [birth year]  Weekly Radiation Therapy Management  Current Dose: 3.6 Gy     Planned Dose:  63 Gy  Narrative . . . . . . . . The patient presents for routine under treatment assessment.                                                     The patient asked to be seen today for continued problems with nausea. He has had several episodes of emesis. He was able to keep down an Ensure over the past 24 hours but no water or other intake since. He denies any dizziness with standing.                                 Set-up films were reviewed.                                 The chart was checked. Physical Findings. . .  weight is 138 lb 4.8 oz (62.732 kg). His oral temperature is 98.3 F (36.8 C). His blood pressure is 150/107 and his pulse is 79. His respiration is 20. . The lungs are clear. The heart has a regular rhythm and rate your the abdomen is soft with bowel sounds decreased. There is no rebound or guarding present.  Approximately 3 pound weight loss over the past 24 hours Impression . . . . . . . The patient is  tolerating radiation.  He appears to be having continued problems with nausea related to his chemotherapy earlier this week. Patient was given a sublingual Ativan in our department.  This helped his symptoms and I've given him a prescription for 0.5 mg to use every 8 hours sublingual as needed for nausea. Plan . . . . . . . . . . . . Continue treatment as planned.  ________________________________  -----------------------------------  Billie Lade, PhD, MD

## 2012-07-18 ENCOUNTER — Ambulatory Visit
Admission: RE | Admit: 2012-07-18 | Discharge: 2012-07-18 | Disposition: A | Discharge status: Home / Self Care | Payer: 59 | Source: Ambulatory Visit | Attending: Radiation Oncology | Admitting: Radiation Oncology

## 2012-07-18 ENCOUNTER — Ambulatory Visit: Admission: RE | Admit: 2012-07-18 | Payer: 59 | Source: Ambulatory Visit

## 2012-07-18 ENCOUNTER — Other Ambulatory Visit: Payer: Self-pay | Admitting: Radiation Oncology

## 2012-07-18 ENCOUNTER — Telehealth: Payer: Self-pay | Admitting: Medical Oncology

## 2012-07-18 ENCOUNTER — Telehealth: Payer: Self-pay | Admitting: *Deleted

## 2012-07-18 ENCOUNTER — Ambulatory Visit: Payer: 59

## 2012-07-18 ENCOUNTER — Encounter: Payer: Self-pay | Admitting: Radiation Oncology

## 2012-07-18 VITALS — BP 132/98 | HR 76 | Temp 98.8°F | Resp 20

## 2012-07-18 DIAGNOSIS — Z923 Personal history of irradiation: Secondary | ICD-10-CM

## 2012-07-18 DIAGNOSIS — C3491 Malignant neoplasm of unspecified part of right bronchus or lung: Secondary | ICD-10-CM

## 2012-07-18 DIAGNOSIS — C7931 Secondary malignant neoplasm of brain: Secondary | ICD-10-CM

## 2012-07-18 DIAGNOSIS — C7949 Secondary malignant neoplasm of other parts of nervous system: Secondary | ICD-10-CM

## 2012-07-18 HISTORY — DX: Personal history of irradiation: Z92.3

## 2012-07-18 MED ORDER — PROCHLORPERAZINE MALEATE 10 MG PO TABS
10.0000 mg | ORAL_TABLET | Freq: Four times a day (QID) | ORAL | Status: DC | PRN
  Administered 2012-07-18: 10 mg via ORAL
  Filled 2012-07-18: qty 1

## 2012-07-18 MED ORDER — ONDANSETRON HCL 8 MG PO TABS: 8.0000 mg | ORAL_TABLET | Freq: Three times a day (TID) | ORAL | Status: DC | PRN

## 2012-07-18 NOTE — Progress Notes (Signed)
  Radiation Oncology         [phone] ________________________________  Stereotactic Treatment Procedure Note  Name: Terrance Clark MRN: [id number]  Date: 07/18/2012  DOB: [birth year]  SPECIAL TREATMENT PROCEDURE  3D TREATMENT PLANNING AND DOSIMETRY:  The patient's radiation plan was reviewed and approved by neurosurgery and radiation oncology prior to treatment.  It showed 3-dimensional radiation distributions overlaid onto the planning CT/MRI image set.  The [facility] for the target structures as well as the organs at risk were reviewed. The documentation of the 3D plan and dosimetry are filed in the radiation oncology EMR.  NARRATIVE:  Terrance Clark was brought to the TrueBeam stereotactic radiation treatment machine and placed supine on the CT couch. The head frame was applied, and the patient was set up for stereotactic radiosurgery.  Dr. Phoebe Perch from neurosurgery was present for the set-up and delivery  SIMULATION VERIFICATION:  In the couch zero-angle position, the patient underwent Exactrac imaging using the Brainlab system with orthogonal KV images.  These were carefully aligned and repeated to confirm treatment position for each of the isocenters.  The Exactrac snap film verification was repeated at each couch angle.  SPECIAL TREATMENT PROCEDURE: Terrance Clark received stereotactic radiosurgery to the following targets: Right parietal 21 mm target was treated using 4 Dynamic Conformal Arcs to a prescription dose of 20 Gy.  ExacTrac registration was performed for each couch angle.  The 80% isodose line was prescribed. Right posterior parietal 6 mm target was treated using 4 Dynamic Conformal Arcs to a prescription dose of 20 Gy.  ExacTrac registration was performed for each couch angle.  The 78.9% isodose line was prescribed. Left thalamic 5 mm target was treated using 3 Dynamic Conformal Arcs to a prescription dose of 20 Gy.  ExacTrac registration was performed for each couch angle.  The 80.6%  isodose line was prescribed.  STEREOTACTIC TREATMENT MANAGEMENT:  Following delivery, the patient was transported to nursing in stable condition and monitored for possible acute effects.  Vital signs were recorded BP 155/96  Pulse 84  Temp(Src) 98.4 F (36.9 C) (Oral)  Resp 20. The patient tolerated treatment without significant acute effects, except some baseline nausea treated with compazine, and was discharged to home in stable condition.    PLAN: Follow-up in one month.  ________________________________  Artist Pais. Kathrynn Running, M.D.

## 2012-07-18 NOTE — Telephone Encounter (Signed)
Message copied by Charma Igo on Thu Jul 18, 2012  4:58 PM ------      Message from: Si Gaul      Created: Mon Jul 15, 2012 10:32 AM       Call patient with the result and order K Dur 20 meq po qd X 7 days ------

## 2012-07-18 NOTE — Telephone Encounter (Signed)
Pt stated he is already taking potassium that was called in earlier this week.

## 2012-07-18 NOTE — Progress Notes (Signed)
Pt denies pain, HA, nausea, dizziness, unsteadiness, blurred vision. Pt resting quietly in chair, friend w/pt. Pt drinking soda, eating applesauce, crackers. Pt states Ativan 0.5 mg he took yesterday in this office did not relieve his bnausea. He states he went hoem and slept. He states he was able to drink 2 Ensures this morning.  2:00 pm  Pt in restroom voiting, last dose of Comapzine 10:30 am today. Per Dr Kathrynn Running, gave pt Compazine 10 mg PO. 2:20 pm  Pt states he feels better. 2:43 pm  Pt states he has not vomited since taking Compazine, denies nausea at this time, denies pain, HA, dizziness, unsteadiness, blurred vision. He has eaten crackers, applesauce, drank Ginger ale. Reminded pt that Dr Roselind Messier sent e-script for Zofran, for nausea to pt's pharmacy.  Dr Kathrynn Running in to see pt prior to d/c home w/friend.

## 2012-07-19 ENCOUNTER — Ambulatory Visit
Admission: RE | Admit: 2012-07-19 | Discharge: 2012-07-19 | Disposition: A | Discharge status: Home / Self Care | Payer: 59 | Source: Ambulatory Visit | Attending: Radiation Oncology | Admitting: Radiation Oncology

## 2012-07-19 NOTE — Telephone Encounter (Signed)
error 

## 2012-07-20 ENCOUNTER — Ambulatory Visit
Admission: RE | Admit: 2012-07-20 | Discharge: 2012-07-20 | Disposition: A | Discharge status: Home / Self Care | Payer: 59 | Source: Ambulatory Visit | Attending: Radiation Oncology | Admitting: Radiation Oncology

## 2012-07-21 ENCOUNTER — Encounter: Payer: Self-pay | Admitting: Radiation Oncology

## 2012-07-22 ENCOUNTER — Telehealth: Payer: Self-pay | Admitting: Internal Medicine

## 2012-07-22 ENCOUNTER — Ambulatory Visit: Payer: 59

## 2012-07-22 ENCOUNTER — Ambulatory Visit
Admission: RE | Admit: 2012-07-22 | Discharge: 2012-07-22 | Disposition: A | Discharge status: Home / Self Care | Payer: 59 | Source: Ambulatory Visit | Attending: Radiation Oncology | Admitting: Radiation Oncology

## 2012-07-22 ENCOUNTER — Ambulatory Visit (HOSPITAL_BASED_OUTPATIENT_CLINIC_OR_DEPARTMENT_OTHER): Payer: 59 | Admitting: Internal Medicine

## 2012-07-22 ENCOUNTER — Other Ambulatory Visit (HOSPITAL_BASED_OUTPATIENT_CLINIC_OR_DEPARTMENT_OTHER): Payer: 59 | Admitting: Lab

## 2012-07-22 ENCOUNTER — Encounter: Payer: Self-pay | Admitting: Internal Medicine

## 2012-07-22 VITALS — BP 130/87 | HR 87 | Temp 98.4°F | Resp 18 | Ht 71.0 in | Wt 132.4 lb

## 2012-07-22 DIAGNOSIS — C3491 Malignant neoplasm of unspecified part of right bronchus or lung: Secondary | ICD-10-CM

## 2012-07-22 DIAGNOSIS — C7949 Secondary malignant neoplasm of other parts of nervous system: Secondary | ICD-10-CM

## 2012-07-22 DIAGNOSIS — C3410 Malignant neoplasm of upper lobe, unspecified bronchus or lung: Secondary | ICD-10-CM

## 2012-07-22 DIAGNOSIS — C7931 Secondary malignant neoplasm of brain: Secondary | ICD-10-CM

## 2012-07-22 LAB — CBC WITH DIFFERENTIAL/PLATELET
Basophils Absolute: 0 10*3/uL (ref 0.0–0.1)
EOS%: 0.9 % (ref 0.0–7.0)
Eosinophils Absolute: 0 10*3/uL (ref 0.0–0.5)
HCT: 40.3 % (ref 38.4–49.9)
HGB: 13.4 g/dL (ref 13.0–17.1)
LYMPH%: 26.4 % (ref 14.0–49.0)
MCH: 28.3 pg (ref 27.2–33.4)
MCV: 85.2 fL (ref 79.3–98.0)
MONO%: 8 % (ref 0.0–14.0)
NEUT#: 2.8 10*3/uL (ref 1.5–6.5)
NEUT%: 64.5 % (ref 39.0–75.0)
Platelets: 184 10*3/uL (ref 140–400)

## 2012-07-22 NOTE — Telephone Encounter (Signed)
gv pt appt schedule for May/June. Per new orders given 5/12 tx now q3w.

## 2012-07-22 NOTE — Progress Notes (Signed)
[facility] [phone]) [phone]   [phone]  OFFICE PROGRESS NOTE  Default, Provider, MD [address] [place] [address]  DIAGNOSIS: Metastatic non-small cell lung cancer, adenocarcinoma with unknown status of the EGFR mutation or ALK gene translocation secondary to insufficient material, diagnosed in April 2014.  PRIOR THERAPY: 1) stereotactic radiotherapy under the care of Dr. Kathrynn Running completed on 07/18/2012.  CURRENT THERAPY: Systemic chemotherapy with carboplatin for AUC of 5 and Alimta 500 mg/M2 giving every 3 weeks, first dose was given on 07/15/2012   INTERVAL HISTORY: Terrance Clark 37 y.o. male returns to the clinic today for followup visit accompanied by his girlfriend. The patient was started the first cycle of systemic chemotherapy with carboplatin and Alimta last week and he tolerated it fairly well. He denied having any significant nausea or vomiting. He denied having any fatigue or weakness. He has no chest pain, shortness of breath, cough or hemoptysis. He is currently undergoing palliative radiotherapy to the right upper lobe lung mass and mediastinal lymphadenopathy under the care of Dr. Kathrynn Running and he is tolerating it fairly well. His last PET scan showed evidence for metastatic disease to the contralateral supraclavicular lymph nodes and brain MRI showed evidence for metastatic brain lesion.   MEDICAL HISTORY: Past Medical History  Diagnosis Date  . H/O oral surgery 04/2012  . Cough   . Shortness of breath     with exertion  . Insomnia     d/t pain and takes Goody's PM    ALLERGIES:  has No Known Allergies.  MEDICATIONS:  Current Outpatient Prescriptions  Medication Sig Dispense Refill  . dexamethasone (DECADRON) 4 MG tablet Take 1 tablet (4 mg total) by mouth as directed. 1 tablet twice daily day before, day of, day after chemo  40 tablet  1  . folic acid (FOLVITE) 1 MG tablet Take 1 tablet (1 mg total) by mouth daily.  30  tablet  5  . ibuprofen (ADVIL,MOTRIN) 200 MG tablet Take 3 tablets (600 mg total) by mouth every 6 (six) hours as needed for pain.  30 tablet  0  . LORazepam (ATIVAN) 0.5 MG tablet Place 1 tablet (0.5 mg total) under the tongue every 8 (eight) hours.  30 tablet  0  . ondansetron (ZOFRAN) 8 MG tablet Take 1 tablet (8 mg total) by mouth every 8 (eight) hours as needed for nausea.  20 tablet  2  . oxyCODONE (ROXICODONE) 5 MG immediate release tablet Take 1-2 tablets (5-10 mg total) by mouth every 4 (four) hours as needed for pain.  120 tablet  0  . potassium chloride SA (K-DUR,KLOR-CON) 20 MEQ tablet Take 1 tablet (20 mEq total) by mouth daily.  7 tablet  0  . prochlorperazine (COMPAZINE) 10 MG tablet Take 1 tablet (10 mg total) by mouth every 6 (six) hours as needed (nausea and vomiting).  30 tablet  1   No current facility-administered medications for this visit.    SURGICAL HISTORY:  Past Surgical History  Procedure Laterality Date  . Foot surgery    . Mouth surgery    . Video bronchoscopy with endobronchial ultrasound Right 06/19/2012    Procedure: VIDEO BRONCHOSCOPY WITH ENDOBRONCHIAL ULTRASOUND;  Surgeon: Leslye Peer, MD;  Location: [facility] OR;  Service: Pulmonary;  Laterality: Right;    REVIEW OF SYSTEMS:  A comprehensive review of systems was negative.   PHYSICAL EXAMINATION: General appearance: alert, cooperative and no distress Head: Normocephalic, without obvious abnormality, atraumatic  Neck: no adenopathy Lymph nodes: Cervical, supraclavicular, and axillary nodes normal. Resp: clear to auscultation bilaterally Cardio: regular rate and rhythm, S1, S2 normal, no murmur, click, rub or gallop GI: soft, non-tender; bowel sounds normal; no masses,  no organomegaly Extremities: extremities normal, atraumatic, no cyanosis or edema Neurologic: Alert and oriented X 3, normal strength and tone. Normal symmetric reflexes. Normal coordination and gait  ECOG PERFORMANCE STATUS: 1 - Symptomatic  but completely ambulatory  Blood pressure 130/87, pulse 87, temperature 98.4 F (36.9 C), temperature source Oral, resp. rate 18, height 5\' 11"  (1.803 m), weight 132 lb 6.4 oz (60.056 kg).  LABORATORY DATA: Lab Results  Component Value Date   WBC 4.4 07/22/2012   HGB 13.4 07/22/2012   HCT 40.3 07/22/2012   MCV 85.2 07/22/2012   PLT 184 07/22/2012      Chemistry      Component Value Date/Time   NA 139 07/15/2012 0814   NA 141 06/19/2012 0800   K 3.2* 07/15/2012 0814   K 3.9 06/19/2012 0800   CL 103 07/15/2012 0814   CL 105 06/19/2012 0800   CO2 25 07/15/2012 0814   CO2 25 06/19/2012 0800   BUN 12.4 07/15/2012 0814   BUN 18 06/19/2012 0800   CREATININE 0.8 07/15/2012 0814   CREATININE 0.86 06/19/2012 0800      Component Value Date/Time   CALCIUM 9.2 07/15/2012 0814   CALCIUM 9.5 06/19/2012 0800   ALKPHOS 117 07/15/2012 0814   AST 12 07/15/2012 0814   ALT 7 07/15/2012 0814   BILITOT 0.38 07/15/2012 0814       RADIOGRAPHIC STUDIES: Mr Laqueta Jean NK Contrast  07-11-12  *RADIOLOGY REPORT*  Clinical Data: Lung cancer.  Staging.  MRI HEAD WITHOUT AND WITH CONTRAST  Technique:  Multiplanar, multiecho pulse sequences of the brain and surrounding structures were obtained according to standard protocol without and with intravenous contrast  Contrast: 14mL MULTIHANCE GADOBENATE DIMEGLUMINE 529 MG/ML IV SOLN  Comparison: MRI brain 07/08/2012  Findings: Stereotactic radiosurgery protocol with thin sections post contrast.  The patient had difficulty holding still towards the end of the study.  The postcontrast images are degraded by mild to moderate motion.  This could obscure small lesions.  Enhancing cystic mass in the high right parietal lobe measures 16 x 21 mm and is unchanged from the prior study.  There is evidence of prior hemorrhage in this lesion and there is surrounding edema. These findings are also unchanged.  Small enhancing nodule right posterior parietal cortex also unchanged from the prior study.  This lesion  measures approximately 6 mm in size and shows evidence of prior hemorrhage.  5 mm enhancing lesion right thalamus, unchanged from the prior study.  No other lesions are identified.  Ventricle size is normal.  No midline shift.  Negative for acute infarct.  IMPRESSION: Three metastatic deposits the brain, unchanged from the prior MRI 07/08/2012.  The two   parietal lesions  show evidence of mild hemorrhage.  There is mild edema around the largest lesion in the right parietal lobe.  No midline shift.  The patient had difficulty holding still and there is motion degrading the postcontrast images.   Original Report Authenticated By: Janeece Riggers, M.D.    Mr Laqueta Jean NL Contrast  07/08/2012  *RADIOLOGY REPORT*  Clinical Data: Lung cancer.  Question of intracranial metastatic disease.  MRI HEAD WITHOUT AND WITH CONTRAST  Technique:  Multiplanar, multiecho pulse sequences of the brain and surrounding structures were obtained according  to standard protocol without and with intravenous contrast  Contrast: 14mL MULTIHANCE GADOBENATE DIMEGLUMINE 529 MG/ML IV SOLN  Comparison: No comparison brain MR or head CT.  Findings: No acute infarct.  Medial aspect of the right central sulcus lobulated irregular enhancing partially cystic / necrotic 2.3 x 1.7 x 1.3 cm lesion. Surrounding vasogenic edema.  Right parietal 6.3 mm enhancing lesion.  Right thalamic 2.5 mm enhancing lesion.  Given the patients history of newly diagnosed lung cancer, these findings are suspicious for intracranial metastatic disease.  There may be a small amount hemorrhage or calcification within the largest intracranial metastatic lesion otherwise no evidence of intracranial hemorrhage.  No hydrocephalus.  Major intracranial vascular structures are patent.  Mild paranasal sinus mucosal thickening.  IMPRESSION: Three intracranial metastatic lesions suspected as discussed above.  This has been made a PRA call report utilizing dashboard call feature.   Original  Report Authenticated By: Lacy Duverney, M.D.    Nm Pet Image Initial (pi) Skull Base To Thigh  07/10/2012  *RADIOLOGY REPORT*  Clinical Data: Initial treatment strategy for lung cancer.  NUCLEAR MEDICINE PET SKULL BASE TO THIGH  Fasting Blood Glucose:  95  Technique:  19.8 mCi F-18 FDG was injected intravenously. CT data was obtained and used for attenuation correction and anatomic localization only.  (This was not acquired as a diagnostic CT examination.) Additional exam technical data entered on technologist worksheet.  Comparison:  CT thorax 06/06/2012  Findings:  Neck: There are bilateral hypermetabolic supraclavicular lymph nodes.  For example,  right supraclavicular lymph node measures 13 mm (image 59) with  S U V max 5.4.  Chest:  Small right upper lobe pulmonary nodule has spiculated morphology and metabolic activity with SUV max = 2.4 (image 70). This nodule measures 13 mm.  Hypermetabolic right lower paratracheal nodal mass measuring 17 x 33 mm with intense metabolic activity ( SUV max = 7.5.  There are hypermetabolic contralateral prevascular lymph nodes (image 84) and again hypermetabolic contralateral supra clavicular lymph node (image 58).  Abdomen/Pelvis:  No abnormal hypermetabolic activity within the liver, pancreas, adrenal glands, or spleen.  No hypermetabolic lymph nodes in the abdomen or pelvis.  Skeleton:  No focal hypermetabolic activity to suggest skeletal metastasis.  IMPRESSION:  1.  Hypermetabolic right upper lobe pulmonary nodule consistent with primary bronchogenic carcinoma. 2.  Ipsilateral and contralateral mediastinal hypermetabolic nodal metastasis as well as contralateral supraclavicular nodal metastasis.   Staging by FDG PET imaging and MRI brain imaging T1a N3  M1.( enhancing lesion on comparison brain MRI)   Original Report Authenticated By: Genevive Bi, M.D.     ASSESSMENT: This is a very pleasant 37 years old African American male with metastatic non-small cell lung  cancer, adenocarcinoma with brain metastasis status post stereotactic radiotherapy. The patient is currently undergoing systemic chemotherapy with carboplatin and Alimta in addition to concurrent radiotherapy to the chest.   PLAN: I have a lengthy discussion with the patient and his girlfriend today about his condition. I recommended for him to continue treatment was carboplatin and Alimta in addition to radiation as scheduled. His next dose of chemotherapy on 08/05/2012. I also discussed with the patient proceeding with ultrasound-guided core biopsy of one of the supraclavicular lymph nodes to more tissue to send for molecular study including EGFR mutation as well as ALK gene translocation. The patient would come back for followup visit in 2 weeks with the start of the next cycle of his chemotherapy. He was advised to call immediately if he  has any concerning symptoms in the interval.  All questions were answered. The patient knows to call the clinic with any problems, questions or concerns. We can certainly see the patient much sooner if necessary.  I spent 15 minutes counseling the patient face to face. The total time spent in the appointment was 25 minutes.

## 2012-07-22 NOTE — Addendum Note (Signed)
Encounter addended by: Glennie Hawk, RN on: 07/22/2012 11:18 AM<BR>     Documentation filed: Charges VN

## 2012-07-22 NOTE — Patient Instructions (Signed)
Final stage of the disease changes to stage IV because of the metastatic brain lesion as well as the contralateral supraclavicular lymph nodes. Chemotherapy was changed to carboplatin and Alimta every 3 weeks. Followup visit in 2 weeks with the next cycle of chemotherapy.

## 2012-07-23 ENCOUNTER — Encounter: Payer: Self-pay | Admitting: Radiation Oncology

## 2012-07-23 ENCOUNTER — Ambulatory Visit
Admission: RE | Admit: 2012-07-23 | Discharge: 2012-07-23 | Disposition: A | Discharge status: Home / Self Care | Payer: 59 | Source: Ambulatory Visit | Attending: Radiation Oncology | Admitting: Radiation Oncology

## 2012-07-23 VITALS — BP 119/75 | HR 79 | Temp 98.1°F | Resp 20 | Wt 130.3 lb

## 2012-07-23 DIAGNOSIS — C3491 Malignant neoplasm of unspecified part of right bronchus or lung: Secondary | ICD-10-CM

## 2012-07-23 NOTE — Progress Notes (Signed)
Pt denies pain, fatigue, but states he has complete loss of appetite. He states his nausea is under control; he is taking Zofran every morning.  Pt states his chemo has been changed to q 3 weeks instead of weekly. He is working full time, physically demanding job. Pt does not want to see nutritionist but agreed to have nutritionist call him. Will notify [employer].

## 2012-07-23 NOTE — Progress Notes (Signed)
[facility]    Radiation Oncology [address] [place]     Maryln Gottron, M.D. [place], [place] [address]               Billie Lade, M.D., Ph.D. Phone: [phone]      Molli Hazard A. Kathrynn Running, M.D. Fax: [phone]      Radene Gunning, M.D., Ph.D.         Lurline Hare, M.D.         Grayland Jack, M.D Weekly Treatment Management Note  Name: Terrance Clark     MRN: [id number]        CSN: [id number] Date: 07/23/2012      DOB: 1976-01-27  CC: Default, Provider, MD         Donata Clay    Status: Outpatient  Diagnosis: The encounter diagnosis was Non-small cell carcinoma of lung, right.  Current Dose: 9 Gy  Current Fraction: 5  Planned Dose: 63 Gy  Narrative: [name] was seen today for weekly treatment management. The chart was checked and CBCT  were reviewed. He is tolerating his radiation therapy well at this time without any swallowing difficulties. He does complain of poor taste and poor appetite at this time.  He has lost approximately 8 pounds over the past week.  He continues to work full-time.  Review of patient's allergies indicates no known allergies.  Current Outpatient Prescriptions  Medication Sig Dispense Refill  . dexamethasone (DECADRON) 4 MG tablet Take 1 tablet (4 mg total) by mouth as directed. 1 tablet twice daily day before, day of, day after chemo  40 tablet  1  . folic acid (FOLVITE) 1 MG tablet Take 1 tablet (1 mg total) by mouth daily.  30 tablet  5  . ibuprofen (ADVIL,MOTRIN) 200 MG tablet Take 3 tablets (600 mg total) by mouth every 6 (six) hours as needed for pain.  30 tablet  0  . LORazepam (ATIVAN) 0.5 MG tablet Place 1 tablet (0.5 mg total) under the tongue every 8 (eight) hours.  30 tablet  0  . ondansetron (ZOFRAN) 8 MG tablet Take 1 tablet (8 mg total) by mouth every 8 (eight) hours as needed for nausea.  20 tablet  2  . oxyCODONE (ROXICODONE) 5 MG immediate release tablet Take 1-2 tablets (5-10 mg total) by mouth every 4 (four)  hours as needed for pain.  120 tablet  0  . potassium chloride SA (K-DUR,KLOR-CON) 20 MEQ tablet Take 1 tablet (20 mEq total) by mouth daily.  7 tablet  0  . prochlorperazine (COMPAZINE) 10 MG tablet Take 1 tablet (10 mg total) by mouth every 6 (six) hours as needed (nausea and vomiting).  30 tablet  1   No current facility-administered medications for this encounter.   Labs:  Lab Results  Component Value Date   WBC 4.4 07/22/2012   HGB 13.4 07/22/2012   HCT 40.3 07/22/2012   MCV 85.2 07/22/2012   PLT 184 07/22/2012   Lab Results  Component Value Date   CREATININE 0.8 07/15/2012   BUN 12.4 07/15/2012   NA 139 07/15/2012   K 3.2* 07/15/2012   CL 103 07/15/2012   CO2 25 07/15/2012   Lab Results  Component Value Date   ALT 7 07/15/2012   AST 12 07/15/2012   BILITOT 0.38 07/15/2012    Physical Examination:  weight is 130 lb 4.8 oz (59.104 kg). His oral temperature is 98.1 F (36.7 C). His blood pressure is 119/75 and  his pulse is 79. His respiration is 20.    Wt Readings from Last 3 Encounters:  07/23/12 130 lb 4.8 oz (59.104 kg)  07/22/12 132 lb 6.4 oz (60.056 kg)  07/17/12 138 lb 4.8 oz (62.732 kg)    The oral cavity is moist without secondary infection. Lungs - Normal respiratory effort, chest expands symmetrically. Lungs are clear to auscultation, no crackles or wheezes.  Heart has regular rhythm and rate  Abdomen is soft and non tender with normal bowel sounds  Assessment:  Patient tolerating treatments well  Plan: Continue treatment per original radiation prescription.  Nutrition consult

## 2012-07-24 ENCOUNTER — Ambulatory Visit
Admission: RE | Admit: 2012-07-24 | Discharge: 2012-07-24 | Disposition: A | Discharge status: Home / Self Care | Payer: 59 | Source: Ambulatory Visit | Attending: Radiation Oncology | Admitting: Radiation Oncology

## 2012-07-25 ENCOUNTER — Ambulatory Visit
Admission: RE | Admit: 2012-07-25 | Discharge: 2012-07-25 | Disposition: A | Discharge status: Home / Self Care | Payer: 59 | Source: Ambulatory Visit | Attending: Radiation Oncology | Admitting: Radiation Oncology

## 2012-07-25 ENCOUNTER — Other Ambulatory Visit: Payer: Self-pay | Admitting: Radiation Therapy

## 2012-07-25 DIAGNOSIS — C7931 Secondary malignant neoplasm of brain: Secondary | ICD-10-CM

## 2012-07-26 ENCOUNTER — Ambulatory Visit
Admission: RE | Admit: 2012-07-26 | Discharge: 2012-07-26 | Disposition: A | Discharge status: Home / Self Care | Payer: 59 | Source: Ambulatory Visit | Attending: Radiation Oncology | Admitting: Radiation Oncology

## 2012-07-26 ENCOUNTER — Ambulatory Visit: Payer: 59 | Admitting: Nutrition

## 2012-07-26 NOTE — Progress Notes (Signed)
Patient presents to nutrition appointment. He is a 37 year old male diagnosed with lung cancer with brain metastases. He is a patient of Dr. Roselind Messier.  Past medical history includes oral surgery, cough, and shortness of breath.  Medications include Decadron, folic acid, Ativan, Zofran, and Compazine.  Labs include potassium 3.2 on May 5.  Height: 5 feet 11 inches. Weight: 130 pounds May 13. Usual body weight: 163 pounds. BMI: 18.18.  Patient reports nausea which is improved with his nausea medication. He has taste alterations and a poor appetite. He has been drinking regular ensure or Ensure Plus 3-4 times daily. He continues to work 2 jobs. He denies other nutrition impact symptoms. Patient states he basically forces himself to eat and drink.  Nutrition diagnosis: Inadequate oral intake related to diagnosis of metastatic lung cancer and associated treatments as evidenced by 20% weight loss from usual body weight.  Patient meets criteria for severe malnutrition in the context of acute illness secondary to 20% weight loss from usual body weight and depletion of fat and muscle stores on physical exam.  Intervention: I educated patient on the importance of increased calories and protein throughout the day. I have encouraged him to consume high-calorie, high-protein foods every 2 hours. I've recommended patient consume Ensure Plus 4 times a day. I provided one complementary case of Ensure Plus for patient to take today. I reviewed strategies for preparing milkshakes and other high-calorie meals and snacks for patient. I've educated him on strategies for improving taste of food. He is to continue to take nausea medication as prescribed by his physician. Patient was provided with fact sheets on increasing calories and protein and taste alterations as well as coupons. Teach back method used.  Monitoring, evaluation, goals: Patient will tolerate increased calories and protein to minimize further weight  loss.  Next visit: I will attempt followup with patient on Tuesday, May 27 during chemotherapy.

## 2012-07-29 ENCOUNTER — Ambulatory Visit
Admission: RE | Admit: 2012-07-29 | Discharge: 2012-07-29 | Disposition: A | Discharge status: Home / Self Care | Payer: 59 | Source: Ambulatory Visit | Attending: Radiation Oncology | Admitting: Radiation Oncology

## 2012-07-29 ENCOUNTER — Ambulatory Visit: Payer: 59

## 2012-07-29 ENCOUNTER — Other Ambulatory Visit (HOSPITAL_BASED_OUTPATIENT_CLINIC_OR_DEPARTMENT_OTHER): Payer: 59

## 2012-07-29 DIAGNOSIS — C3410 Malignant neoplasm of upper lobe, unspecified bronchus or lung: Secondary | ICD-10-CM

## 2012-07-29 DIAGNOSIS — C3491 Malignant neoplasm of unspecified part of right bronchus or lung: Secondary | ICD-10-CM

## 2012-07-29 LAB — CBC WITH DIFFERENTIAL/PLATELET
BASO%: 0.8 % (ref 0.0–2.0)
EOS%: 1 % (ref 0.0–7.0)
HGB: 14.6 g/dL (ref 13.0–17.1)
MCH: 29.1 pg (ref 27.2–33.4)
MCHC: 33.4 g/dL (ref 32.0–36.0)
MONO#: 0.4 10*3/uL (ref 0.1–0.9)
RDW: 13.2 % (ref 11.0–14.6)
WBC: 5.8 10*3/uL (ref 4.0–10.3)
lymph#: 0.8 10*3/uL — ABNORMAL LOW (ref 0.9–3.3)

## 2012-07-29 LAB — COMPREHENSIVE METABOLIC PANEL (CC13)
ALT: 21 U/L (ref 0–55)
AST: 11 U/L (ref 5–34)
Albumin: 4 g/dL (ref 3.5–5.0)
CO2: 26 mEq/L (ref 22–29)
Calcium: 9.5 mg/dL (ref 8.4–10.4)
Chloride: 99 mEq/L (ref 98–107)
Potassium: 3.8 mEq/L (ref 3.5–5.1)
Total Protein: 7.1 g/dL (ref 6.4–8.3)

## 2012-07-30 ENCOUNTER — Ambulatory Visit
Admission: RE | Admit: 2012-07-30 | Discharge: 2012-07-30 | Disposition: A | Discharge status: Home / Self Care | Payer: 59 | Source: Ambulatory Visit | Attending: Radiation Oncology | Admitting: Radiation Oncology

## 2012-07-30 VITALS — BP 126/78 | HR 92 | Temp 98.0°F | Resp 20 | Wt 131.4 lb

## 2012-07-30 DIAGNOSIS — C3491 Malignant neoplasm of unspecified part of right bronchus or lung: Secondary | ICD-10-CM

## 2012-07-30 DIAGNOSIS — C7931 Secondary malignant neoplasm of brain: Secondary | ICD-10-CM

## 2012-07-30 MED ORDER — SUCRALFATE 1 GM/10ML PO SUSP: 1.0000 g | Freq: Four times a day (QID) | ORAL | Status: DC

## 2012-07-30 MED ORDER — METHYLPREDNISOLONE 4 MG PO KIT: PACK | ORAL | Status: DC

## 2012-07-30 MED ORDER — RADIAPLEXRX EX GEL
Freq: Once | CUTANEOUS | Status: AC
  Administered 2012-07-30: 11:00:00 via TOPICAL

## 2012-07-30 MED ORDER — HYDROCODONE-ACETAMINOPHEN 7.5-325 MG/15ML PO SOLN: 15.0000 mL | Freq: Four times a day (QID) | ORAL | Status: DC | PRN

## 2012-07-30 MED ORDER — TRAMADOL HCL 50 MG PO TABS: 50.0000 mg | ORAL_TABLET | Freq: Four times a day (QID) | ORAL | Status: DC | PRN

## 2012-07-30 NOTE — Progress Notes (Signed)
Pt c/o painful swallowing in throat and esophageal area. He states he is drinking 5 protein supplements daily but often doesn't eat due to lack of appetite and pain.  He has seen nutritionist. He is fatigued but working 2 jobs, 60-65 hrs a week. Pt reports SOB w/minimal activity, dry cough which he states has increased in frequency. Pt requesting med to increase his appetite, energy and med for esophageal pain.

## 2012-07-30 NOTE — Progress Notes (Signed)
[facility]    Radiation Oncology [address] [place]     Maryln Gottron, M.D. [place], [place] [address]               Billie Lade, M.D., Ph.D. Phone: [phone]      Molli Hazard A. Kathrynn Running, M.D. Fax: [phone]      Radene Gunning, M.D., Ph.D.         Lurline Hare, M.D.         Grayland Jack, M.D Weekly Treatment Management Note  Name: Terrance Clark     MRN: [id number]        CSN: [id number] Date: 07/30/2012      DOB: 1975/11/12  CC: Default, Provider, MD         Donata Clay    Status: Outpatient  Diagnosis: Non-small cell lung cancer  Current Dose: 18 Gy  Current Fraction: 10  Planned Dose: 63 Gy  Narrative: Terrance Clark was seen today for weekly treatment management. The chart was checked and CBCT  were reviewed. He is having a fair amount of fatigue and is frustrated by this because he wishes to continue working full-time. He is also starting to have some esophageal irritation.  Patient has been given Hycet as well as Carafate suspension and Ultram. I've also given him a limited course of steroids.  He understands that he would not be able to continue steroids throughout his course of therapy.  Review of patient's allergies indicates no known allergies.  Current Outpatient Prescriptions  Medication Sig Dispense Refill  . dexamethasone (DECADRON) 4 MG tablet Take 1 tablet (4 mg total) by mouth as directed. 1 tablet twice daily day before, day of, day after chemo  40 tablet  1  . folic acid (FOLVITE) 1 MG tablet Take 1 tablet (1 mg total) by mouth daily.  30 tablet  5  . ibuprofen (ADVIL,MOTRIN) 200 MG tablet Take 3 tablets (600 mg total) by mouth every 6 (six) hours as needed for pain.  30 tablet  0  . LORazepam (ATIVAN) 0.5 MG tablet Place 1 tablet (0.5 mg total) under the tongue every 8 (eight) hours.  30 tablet  0  . ondansetron (ZOFRAN) 8 MG tablet Take 1 tablet (8 mg total) by mouth every 8 (eight) hours as needed for nausea.  20 tablet  2  . oxyCODONE  (ROXICODONE) 5 MG immediate release tablet Take 1-2 tablets (5-10 mg total) by mouth every 4 (four) hours as needed for pain.  120 tablet  0  . potassium chloride SA (K-DUR,KLOR-CON) 20 MEQ tablet Take 1 tablet (20 mEq total) by mouth daily.  7 tablet  0  . prochlorperazine (COMPAZINE) 10 MG tablet Take 1 tablet (10 mg total) by mouth every 6 (six) hours as needed (nausea and vomiting).  30 tablet  1  . HYDROcodone-acetaminophen (HYCET) 7.5-325 mg/15 ml solution Take 15 mLs by mouth 4 (four) times daily as needed for pain.  473 mL  0  . methylPREDNISolone (MEDROL, PAK,) 4 MG tablet follow package directions  21 tablet  0  . sucralfate (CARAFATE) 1 GM/10ML suspension Take 10 mLs (1 g total) by mouth 4 (four) times daily.  420 mL  0  . traMADol (ULTRAM) 50 MG tablet Take 1 tablet (50 mg total) by mouth every 6 (six) hours as needed for pain.  30 tablet  1   No current facility-administered medications for this encounter.   Labs:  Lab Results  Component Value Date  WBC 5.8 07/29/2012   HGB 14.6 07/29/2012   HCT 43.6 07/29/2012   MCV 86.9 07/29/2012   PLT 136* 07/29/2012   Lab Results  Component Value Date   CREATININE 0.9 07/29/2012   BUN 19.0 07/29/2012   NA 136 07/29/2012   K 3.8 07/29/2012   CL 99 07/29/2012   CO2 26 07/29/2012   Lab Results  Component Value Date   ALT 21 07/29/2012   AST 11 07/29/2012   BILITOT 0.36 07/29/2012    Physical Examination:  weight is 131 lb 6.4 oz (59.603 kg). His oral temperature is 98 F (36.7 C). His blood pressure is 126/78 and his pulse is 92. His respiration is 20 and oxygen saturation is 100%.    Wt Readings from Last 3 Encounters:  07/30/12 131 lb 6.4 oz (59.603 kg)  07/23/12 130 lb 4.8 oz (59.104 kg)  07/22/12 132 lb 6.4 oz (60.056 kg)    The oral cavity is moist without secondary infection. Lungs - Normal respiratory effort, chest expands symmetrically. Lungs are clear to auscultation, no crackles or wheezes.  Heart has regular rhythm and rate   Abdomen is soft and non tender with normal bowel sounds  Assessment:  Patient tolerating treatments well except for issues as above.  Plan: Continue treatment per original radiation prescription

## 2012-07-31 ENCOUNTER — Ambulatory Visit
Admission: RE | Admit: 2012-07-31 | Discharge: 2012-07-31 | Disposition: A | Discharge status: Home / Self Care | Payer: 59 | Source: Ambulatory Visit | Attending: Radiation Oncology | Admitting: Radiation Oncology

## 2012-08-01 ENCOUNTER — Ambulatory Visit
Admission: RE | Admit: 2012-08-01 | Discharge: 2012-08-01 | Disposition: A | Discharge status: Home / Self Care | Payer: 59 | Source: Ambulatory Visit | Attending: Radiation Oncology | Admitting: Radiation Oncology

## 2012-08-02 ENCOUNTER — Ambulatory Visit
Admission: RE | Admit: 2012-08-02 | Discharge: 2012-08-02 | Disposition: A | Discharge status: Home / Self Care | Payer: 59 | Source: Ambulatory Visit | Attending: Radiation Oncology | Admitting: Radiation Oncology

## 2012-08-06 ENCOUNTER — Other Ambulatory Visit (HOSPITAL_BASED_OUTPATIENT_CLINIC_OR_DEPARTMENT_OTHER): Payer: 59 | Admitting: Lab

## 2012-08-06 ENCOUNTER — Ambulatory Visit
Admission: RE | Admit: 2012-08-06 | Discharge: 2012-08-06 | Disposition: A | Discharge status: Home / Self Care | Payer: 59 | Source: Ambulatory Visit | Attending: Radiation Oncology | Admitting: Radiation Oncology

## 2012-08-06 ENCOUNTER — Encounter: Payer: Self-pay | Admitting: Internal Medicine

## 2012-08-06 ENCOUNTER — Other Ambulatory Visit: Payer: Self-pay | Admitting: Medical Oncology

## 2012-08-06 ENCOUNTER — Telehealth: Payer: Self-pay | Admitting: Internal Medicine

## 2012-08-06 ENCOUNTER — Ambulatory Visit (HOSPITAL_BASED_OUTPATIENT_CLINIC_OR_DEPARTMENT_OTHER): Payer: 59 | Admitting: Internal Medicine

## 2012-08-06 ENCOUNTER — Ambulatory Visit: Payer: 59

## 2012-08-06 VITALS — BP 127/67 | HR 98 | Temp 98.6°F | Ht 71.0 in | Wt 132.4 lb

## 2012-08-06 VITALS — BP 123/77 | HR 97 | Temp 97.9°F | Resp 18 | Ht 71.0 in | Wt 132.1 lb

## 2012-08-06 DIAGNOSIS — C3490 Malignant neoplasm of unspecified part of unspecified bronchus or lung: Secondary | ICD-10-CM

## 2012-08-06 DIAGNOSIS — C3492 Malignant neoplasm of unspecified part of left bronchus or lung: Secondary | ICD-10-CM

## 2012-08-06 DIAGNOSIS — C3491 Malignant neoplasm of unspecified part of right bronchus or lung: Secondary | ICD-10-CM

## 2012-08-06 LAB — CBC WITH DIFFERENTIAL/PLATELET
BASO%: 0.2 % (ref 0.0–2.0)
EOS%: 0.5 % (ref 0.0–7.0)
HCT: 42.5 % (ref 38.4–49.9)
MCH: 29 pg (ref 27.2–33.4)
MCHC: 33.6 g/dL (ref 32.0–36.0)
NEUT%: 76 % — ABNORMAL HIGH (ref 39.0–75.0)
RBC: 4.93 10*6/uL (ref 4.20–5.82)
RDW: 13.7 % (ref 11.0–14.6)
lymph#: 0.7 10*3/uL — ABNORMAL LOW (ref 0.9–3.3)

## 2012-08-06 MED ORDER — OXYCODONE HCL 5 MG PO TABS: 5.0000 mg | ORAL_TABLET | ORAL | Status: DC | PRN

## 2012-08-06 MED ORDER — MIRTAZAPINE 30 MG PO TABS: 30.0000 mg | ORAL_TABLET | Freq: Every day | ORAL | Status: DC

## 2012-08-06 NOTE — Progress Notes (Addendum)
Terrance Clark here for weekly under treat visit.  He has had 14/35 fractions to his right chest.  He is reporting pain that he is rating at a 7/10 in his neck and his right chest.  He has ran out of all his medicaitons and has not refilled them.  He states he did call and "got the run around and they acted like I was addicted" so he has not tried to refill them.  He is short of breath with activity.  His oxygen saturation today is 100% on room air.  He does have a sore throat when swallowing.  He is able to swallow liquids occasionally.  His skin on the treatment area is intact.  He is using radiaplex gel twice a day.  He is fatigued.

## 2012-08-06 NOTE — Telephone Encounter (Signed)
gv and printed appt sched and avs for pt  °

## 2012-08-06 NOTE — Progress Notes (Signed)
  Radiation Oncology         [phone] ________________________________  Name: Terrance Clark MRN: [id number]  Date: 08/06/2012  DOB: Aug 03, 1975  Weekly Radiation Therapy Management  Current Dose: 25.2 Gy     Planned Dose:  63 Gy  Narrative . . . . . . . . The patient presents for routine under treatment assessment.                                                     The patient is without complaint for fatigue and a sore throat and difficulty swallowing.  The patient continues to work full-time thus far.  The Lortab elixir made the patient feels dizzy  and he stopped using.   the oxycodone seems to do well for his pain. He is able to swallow pills at this time. He decided not to start on the Medrol Dosepak after reading potential side effects.  He is having some mild itching in the skin of the radiation portals.                                 Set-up films were reviewed.                                 The chart was checked. Physical Findings. . .  height is 5\' 11"  (1.803 m) and weight is 132 lb 6.4 oz (60.056 kg). His temperature is 98.6 F (37 C). His blood pressure is 127/67 and his pulse is 98. His oxygen saturation is 100%. . Weight essentially stable.  No significant changes.  The oral cavity is moist without secondary infection. Impression . . . . . . . The patient is  tolerating radiation. Plan . . . . . . . . . . . . Continue treatment as planned.  ________________________________  -----------------------------------  Billie Lade, PhD, MD

## 2012-08-06 NOTE — Patient Instructions (Signed)
Continue chemotherapy as scheduled.  Followup visit in 3 weeks. 

## 2012-08-06 NOTE — Progress Notes (Signed)
[facility] [phone]   [phone]  OFFICE PROGRESS NOTE  Default, Provider, MD [address] [place] [address]  DIAGNOSIS: Metastatic non-small cell lung cancer, adenocarcinoma with unknown status of the EGFR mutation or ALK gene translocation secondary to insufficient material, diagnosed in April 2014.   PRIOR THERAPY:  1) stereotactic radiotherapy under the care of Dr. Kathrynn Running completed on 07/18/2012.   CURRENT THERAPY:  1) Systemic chemotherapy with carboplatin for AUC of 5 and Alimta 500 mg/M2 giving every 3 weeks, status post 1 cycle. First dose was given on 07/15/2012. 2) palliative radiotherapy to the left upper lobe and mediastinal lymph nodes.  INTERVAL HISTORY: Terrance Clark 37 y.o. male returns to the clinic today for follow up visit accompanied his girlfriend. The patient tolerated the first cycle of her systemic chemotherapy with carboplatin and Alimta fairly well except for fatigue and mild nausea and vomiting. He is also concerned about sexual dysfunction. He denied having any significant fever or chills. He denied having any cough or hemoptysis but continues to have shortness of breath with exertion. He complains of odynophagia and he is currently on Carafate in addition to pain medications in the form of tramadol, oxycodone and Hycet.  MEDICAL HISTORY: Past Medical History  Diagnosis Date  . H/O oral surgery 04/2012  . Cough   . Shortness of breath     with exertion  . Insomnia     d/t pain and takes Goody's PM    ALLERGIES:  has No Known Allergies.  MEDICATIONS:  Current Outpatient Prescriptions  Medication Sig Dispense Refill  . dexamethasone (DECADRON) 4 MG tablet Take 1 tablet (4 mg total) by mouth as directed. 1 tablet twice daily day before, day of, day after chemo  40 tablet  1  . folic acid (FOLVITE) 1 MG tablet Take 1 tablet (1 mg total) by mouth daily.  30 tablet  5  . HYDROcodone-acetaminophen (HYCET)  7.5-325 mg/15 ml solution Take 15 mLs by mouth 4 (four) times daily as needed for pain.  473 mL  0  . ibuprofen (ADVIL,MOTRIN) 200 MG tablet Take 3 tablets (600 mg total) by mouth every 6 (six) hours as needed for pain.  30 tablet  0  . LORazepam (ATIVAN) 0.5 MG tablet Place 1 tablet (0.5 mg total) under the tongue every 8 (eight) hours.  30 tablet  0  . oxyCODONE (ROXICODONE) 5 MG immediate release tablet Take 1-2 tablets (5-10 mg total) by mouth every 4 (four) hours as needed for pain.  120 tablet  0  . ondansetron (ZOFRAN) 8 MG tablet Take 1 tablet (8 mg total) by mouth every 8 (eight) hours as needed for nausea.  20 tablet  2  . prochlorperazine (COMPAZINE) 10 MG tablet Take 1 tablet (10 mg total) by mouth every 6 (six) hours as needed (nausea and vomiting).  30 tablet  1  . sucralfate (CARAFATE) 1 GM/10ML suspension Take 10 mLs (1 g total) by mouth 4 (four) times daily.  420 mL  0  . traMADol (ULTRAM) 50 MG tablet Take 1 tablet (50 mg total) by mouth every 6 (six) hours as needed for pain.  30 tablet  1   No current facility-administered medications for this visit.    SURGICAL HISTORY:  Past Surgical History  Procedure Laterality Date  . Foot surgery    . Mouth surgery    . Video bronchoscopy with endobronchial ultrasound Right 06/19/2012    Procedure: VIDEO  BRONCHOSCOPY WITH ENDOBRONCHIAL ULTRASOUND;  Surgeon: Leslye Peer, MD;  [facility] OR;  Service: Pulmonary;  Laterality: Right;    REVIEW OF SYSTEMS:  A comprehensive review of systems was negative except for: Constitutional: positive for fatigue Respiratory: positive for dyspnea on exertion Gastrointestinal: positive for odynophagia   PHYSICAL EXAMINATION: General appearance: alert, cooperative and no distress Head: Normocephalic, without obvious abnormality, atraumatic Neck: no adenopathy Lymph nodes: Cervical, supraclavicular, and axillary nodes normal. Resp: clear to auscultation bilaterally Cardio: regular rate and  rhythm, S1, S2 normal, no murmur, click, rub or gallop GI: soft, non-tender; bowel sounds normal; no masses,  no organomegaly Extremities: extremities normal, atraumatic, no cyanosis or edema Neurologic: Alert and oriented X 3, normal strength and tone. Normal symmetric reflexes. Normal coordination and gait  ECOG PERFORMANCE STATUS: 1 - Symptomatic but completely ambulatory  Blood pressure 123/77, pulse 97, temperature 97.9 F (36.6 C), temperature source Oral, resp. rate 18, height 5\' 11"  (1.803 m), weight 132 lb 1.6 oz (59.92 kg).  LABORATORY DATA: Lab Results  Component Value Date   WBC 5.9 08/06/2012   HGB 14.3 08/06/2012   HCT 42.5 08/06/2012   MCV 86.2 08/06/2012   PLT 133* 08/06/2012      Chemistry      Component Value Date/Time   NA 136 07/29/2012 0914   NA 141 06/19/2012 0800   K 3.8 07/29/2012 0914   K 3.9 06/19/2012 0800   CL 99 07/29/2012 0914   CL 105 06/19/2012 0800   CO2 26 07/29/2012 0914   CO2 25 06/19/2012 0800   BUN 19.0 07/29/2012 0914   BUN 18 06/19/2012 0800   CREATININE 0.9 07/29/2012 0914   CREATININE 0.86 06/19/2012 0800      Component Value Date/Time   CALCIUM 9.5 07/29/2012 0914   CALCIUM 9.5 06/19/2012 0800   ALKPHOS 102 07/29/2012 0914   AST 11 07/29/2012 0914   ALT 21 07/29/2012 0914   BILITOT 0.36 07/29/2012 0914       ASSESSMENT: this is a very pleasant 37 years old [place] American male with metastatic non-small cell lung cancer, adenocarcinoma.    PLAN: the patient is currently undergoing systemic chemotherapy with carboplatin and Alimta status post 1 cycle. He was supposed to start cycle #2 today but he was not prepared with a ride back home. I rescheduled his chemotherapy to be given on May 30th 2014. The patient will continue his palliative radiotherapy as scheduled. He would come back for followup visit in 3 weeks with the next cycle of his chemotherapy. For the dysphagia and odynophagia, the patient was advised to take Carafate regularly with his pain  medication. For lack of appetite and depression, I will start the patient on Remeron 30 mg by mouth each bedtime.  All questions were answered. The patient knows to call the clinic with any problems, questions or concerns. We can certainly see the patient much sooner if necessary.  I spent 15 minutes counseling the patient face to face. The total time spent in the appointment was 25 minutes.

## 2012-08-07 ENCOUNTER — Ambulatory Visit
Admission: RE | Admit: 2012-08-07 | Discharge: 2012-08-07 | Disposition: A | Discharge status: Home / Self Care | Payer: 59 | Source: Ambulatory Visit | Attending: Radiation Oncology | Admitting: Radiation Oncology

## 2012-08-08 ENCOUNTER — Encounter (HOSPITAL_COMMUNITY): Payer: Self-pay | Admitting: Pharmacy Technician

## 2012-08-08 ENCOUNTER — Ambulatory Visit
Admission: RE | Admit: 2012-08-08 | Discharge: 2012-08-08 | Disposition: A | Discharge status: Home / Self Care | Payer: 59 | Source: Ambulatory Visit | Attending: Radiation Oncology | Admitting: Radiation Oncology

## 2012-08-09 ENCOUNTER — Ambulatory Visit
Admission: RE | Admit: 2012-08-09 | Discharge: 2012-08-09 | Disposition: A | Discharge status: Home / Self Care | Payer: 59 | Source: Ambulatory Visit | Attending: Radiation Oncology | Admitting: Radiation Oncology

## 2012-08-09 ENCOUNTER — Other Ambulatory Visit: Payer: Self-pay | Admitting: Radiology

## 2012-08-09 ENCOUNTER — Ambulatory Visit (HOSPITAL_BASED_OUTPATIENT_CLINIC_OR_DEPARTMENT_OTHER): Payer: 59

## 2012-08-09 VITALS — BP 118/83 | HR 79 | Temp 98.0°F | Resp 20

## 2012-08-09 DIAGNOSIS — C3410 Malignant neoplasm of upper lobe, unspecified bronchus or lung: Secondary | ICD-10-CM

## 2012-08-09 DIAGNOSIS — Z5111 Encounter for antineoplastic chemotherapy: Secondary | ICD-10-CM

## 2012-08-09 DIAGNOSIS — C3492 Malignant neoplasm of unspecified part of left bronchus or lung: Secondary | ICD-10-CM

## 2012-08-09 DIAGNOSIS — C7931 Secondary malignant neoplasm of brain: Secondary | ICD-10-CM

## 2012-08-09 MED ORDER — SODIUM CHLORIDE 0.9 % IV SOLN
Freq: Once | INTRAVENOUS | Status: AC
  Administered 2012-08-09: 11:00:00 via INTRAVENOUS

## 2012-08-09 MED ORDER — DEXAMETHASONE SODIUM PHOSPHATE 20 MG/5ML IJ SOLN
20.0000 mg | Freq: Once | INTRAMUSCULAR | Status: AC
  Administered 2012-08-09: 20 mg via INTRAVENOUS

## 2012-08-09 MED ORDER — ONDANSETRON 16 MG/50ML IVPB (CHCC)
16.0000 mg | Freq: Once | INTRAVENOUS | Status: AC
  Administered 2012-08-09: 16 mg via INTRAVENOUS

## 2012-08-09 MED ORDER — SODIUM CHLORIDE 0.9 % IV SOLN
643.5000 mg | Freq: Once | INTRAVENOUS | Status: AC
  Administered 2012-08-09: 640 mg via INTRAVENOUS
  Filled 2012-08-09: qty 64

## 2012-08-09 MED ORDER — SODIUM CHLORIDE 0.9 % IV SOLN
500.0000 mg/m2 | Freq: Once | INTRAVENOUS | Status: AC
  Administered 2012-08-09: 900 mg via INTRAVENOUS
  Filled 2012-08-09: qty 36

## 2012-08-09 NOTE — Patient Instructions (Addendum)
[facility] Discharge Instructions for Patients Receiving Chemotherapy  Today you received the following chemotherapy agents Alimta/Carboplatin.  To help prevent nausea and vomiting after your treatment, we encourage you to take your nausea medication as prescribed.   If you develop nausea and vomiting that is not controlled by your nausea medication, call the clinic. If it is after clinic hours your family physician or the after hours number for the clinic or go to the Emergency Department.   BELOW ARE SYMPTOMS THAT SHOULD BE REPORTED IMMEDIATELY:  *FEVER GREATER THAN 100.5 F  *CHILLS WITH OR WITHOUT FEVER  NAUSEA AND VOMITING THAT IS NOT CONTROLLED WITH YOUR NAUSEA MEDICATION  *UNUSUAL SHORTNESS OF BREATH  *UNUSUAL BRUISING OR BLEEDING  TENDERNESS IN MOUTH AND THROAT WITH OR WITHOUT PRESENCE OF ULCERS  *URINARY PROBLEMS  *BOWEL PROBLEMS  UNUSUAL RASH Items with * indicate a potential emergency and should be followed up as soon as possible.  Feel free to call the clinic you have any questions or concerns. The clinic phone number is [phone].   I have been informed and understand all the instructions given to me. I know to contact the clinic, my physician, or go to the Emergency Department if any problems should occur. I do not have any questions at this time, but understand that I may call the clinic during office hours   should I have any questions or need assistance in obtaining follow up care.    __________________________________________  _____________  __________ Signature of Patient or Authorized Representative            Date                   Time    __________________________________________ Nurse's Signature    

## 2012-08-12 ENCOUNTER — Ambulatory Visit
Admission: RE | Admit: 2012-08-12 | Discharge: 2012-08-12 | Disposition: A | Discharge status: Home / Self Care | Payer: 59 | Source: Ambulatory Visit | Attending: Radiation Oncology | Admitting: Radiation Oncology

## 2012-08-12 ENCOUNTER — Encounter (HOSPITAL_COMMUNITY): Payer: Self-pay

## 2012-08-12 ENCOUNTER — Ambulatory Visit (HOSPITAL_COMMUNITY)
Admission: RE | Admit: 2012-08-12 | Discharge: 2012-08-12 | Disposition: A | Discharge status: Home / Self Care | Payer: 59 | Source: Ambulatory Visit | Attending: Internal Medicine | Admitting: Internal Medicine

## 2012-08-12 ENCOUNTER — Other Ambulatory Visit (HOSPITAL_BASED_OUTPATIENT_CLINIC_OR_DEPARTMENT_OTHER): Payer: 59 | Admitting: Lab

## 2012-08-12 DIAGNOSIS — C770 Secondary and unspecified malignant neoplasm of lymph nodes of head, face and neck: Secondary | ICD-10-CM | POA: Insufficient documentation

## 2012-08-12 DIAGNOSIS — C3410 Malignant neoplasm of upper lobe, unspecified bronchus or lung: Secondary | ICD-10-CM

## 2012-08-12 DIAGNOSIS — C7931 Secondary malignant neoplasm of brain: Secondary | ICD-10-CM | POA: Insufficient documentation

## 2012-08-12 DIAGNOSIS — C3491 Malignant neoplasm of unspecified part of right bronchus or lung: Secondary | ICD-10-CM

## 2012-08-12 DIAGNOSIS — C3490 Malignant neoplasm of unspecified part of unspecified bronchus or lung: Secondary | ICD-10-CM | POA: Insufficient documentation

## 2012-08-12 DIAGNOSIS — C7949 Secondary malignant neoplasm of other parts of nervous system: Secondary | ICD-10-CM | POA: Insufficient documentation

## 2012-08-12 LAB — COMPREHENSIVE METABOLIC PANEL (CC13)
ALT: 20 U/L (ref 0–55)
AST: 11 U/L (ref 5–34)
Calcium: 9.1 mg/dL (ref 8.4–10.4)
Chloride: 99 mEq/L (ref 98–107)
Creatinine: 0.9 mg/dL (ref 0.7–1.3)
Potassium: 3.5 mEq/L (ref 3.5–5.1)
Sodium: 137 mEq/L (ref 136–145)
Total Protein: 6.6 g/dL (ref 6.4–8.3)

## 2012-08-12 LAB — CBC WITH DIFFERENTIAL/PLATELET
BASO%: 0.2 % (ref 0.0–2.0)
Basophils Absolute: 0 10*3/uL (ref 0.0–0.1)
EOS%: 0.7 % (ref 0.0–7.0)
HCT: 40.4 % (ref 38.4–49.9)
HGB: 13.3 g/dL (ref 13.0–17.1)
MCH: 28.9 pg (ref 27.2–33.4)
MONO#: 0.2 10*3/uL (ref 0.1–0.9)
RDW: 13.5 % (ref 11.0–14.6)
WBC: 5.6 10*3/uL (ref 4.0–10.3)
lymph#: 0.5 10*3/uL — ABNORMAL LOW (ref 0.9–3.3)

## 2012-08-12 LAB — CBC
Hemoglobin: 13.3 g/dL (ref 13.0–17.0)
Platelets: 118 10*3/uL — ABNORMAL LOW (ref 150–400)
RBC: 4.53 MIL/uL (ref 4.22–5.81)

## 2012-08-12 LAB — APTT: aPTT: 24 seconds (ref 24–37)

## 2012-08-12 LAB — PROTIME-INR
INR: 0.92 (ref 0.00–1.49)
Prothrombin Time: 12.3 seconds (ref 11.6–15.2)

## 2012-08-12 MED ORDER — SODIUM CHLORIDE 0.9 % IV SOLN
INTRAVENOUS | Status: DC
  Administered 2012-08-12: 11:00:00 via INTRAVENOUS

## 2012-08-12 MED ORDER — MIDAZOLAM HCL 2 MG/2ML IJ SOLN
INTRAMUSCULAR | Status: AC | PRN
  Administered 2012-08-12 (×2): 2 mg via INTRAVENOUS

## 2012-08-12 MED ORDER — MIDAZOLAM HCL 2 MG/2ML IJ SOLN
INTRAMUSCULAR | Status: AC
  Filled 2012-08-12: qty 4

## 2012-08-12 MED ORDER — FENTANYL CITRATE 0.05 MG/ML IJ SOLN
INTRAMUSCULAR | Status: AC
  Filled 2012-08-12: qty 4

## 2012-08-12 MED ORDER — FENTANYL CITRATE 0.05 MG/ML IJ SOLN
INTRAMUSCULAR | Status: AC | PRN
  Administered 2012-08-12: 100 ug via INTRAVENOUS

## 2012-08-12 NOTE — H&P (Signed)
Agree 

## 2012-08-12 NOTE — H&P (Signed)
Terrance Clark is an 37 y.o. male.   Chief Complaint: "I'm here to get a biopsy" HPI: Patient with history of metastatic NSC lung carcinoma and hypermetabolic supraclavicular lymph nodes noted on recent PET presents for US guided supraclavicular lymph node biopsy.  Past Medical History  Diagnosis Date  . H/O oral surgery 04/2012  . Cough   . Shortness of breath     with exertion  . Insomnia     d/t pain and takes Goody's PM    Past Surgical History  Procedure Laterality Date  . Foot surgery    . Mouth surgery    . Video bronchoscopy with endobronchial ultrasound Right 06/19/2012    Procedure: VIDEO BRONCHOSCOPY WITH ENDOBRONCHIAL ULTRASOUND;  Surgeon: Leslye Peer, MD;  Location: [facility] OR;  Service: Pulmonary;  Laterality: Right;    Family History  Problem Relation Age of Onset  . Cancer Maternal Aunt     unknown  . Cancer Mother     breast ca 2007, surgery right breast,chemotherapy,no rad txx  . Cancer Maternal Grandfather     colon   Social History:  reports that he has quit smoking. His smoking use included Cigarettes. He has a 22 pack-year smoking history. He has never used smokeless tobacco. He reports that he uses illicit drugs (Marijuana). He reports that he does not drink alcohol.  Allergies: No Known Allergies  Current outpatient prescriptions:dexamethasone (DECADRON) 4 MG tablet, Take 4 mg by mouth 2 (two) times daily. He takes the day before, day of and day after receiving chemo treatments., Disp: , Rfl: ;  folic acid (FOLVITE) 1 MG tablet, Take 1 mg by mouth every morning., Disp: , Rfl: ;  HYDROcodone-acetaminophen (HYCET) 7.5-325 mg/15 ml solution, Take 15 mLs by mouth 4 (four) times daily as needed for pain., Disp: 473 mL, Rfl: 0 mirtazapine (REMERON) 30 MG tablet, Take 30 mg by mouth at bedtime as needed (For sleep.)., Disp: , Rfl: ;  oxyCODONE (ROXICODONE) 5 MG immediate release tablet, Take 1-2 tablets (5-10 mg total) by mouth every 4 (four) hours as needed for pain.,  Disp: 120 tablet, Rfl: 0;  ondansetron (ZOFRAN) 8 MG tablet, Take 1 tablet (8 mg total) by mouth every 8 (eight) hours as needed for nausea., Disp: 20 tablet, Rfl: 2 prochlorperazine (COMPAZINE) 10 MG tablet, Take 1 tablet (10 mg total) by mouth every 6 (six) hours as needed (nausea and vomiting)., Disp: 30 tablet, Rfl: 1;  traMADol (ULTRAM) 50 MG tablet, Take 1 tablet (50 mg total) by mouth every 6 (six) hours as needed for pain., Disp: 30 tablet, Rfl: 1 Current facility-administered medications:0.9 %  sodium chloride infusion, , Intravenous, Continuous, Brayton El, PA-C, Last Rate: 20 mL/hr at 08/12/12 1053   Results for orders placed during the hospital encounter of 08/12/12 (from the past 48 hour(s))  APTT     Status: None   Collection Time    08/12/12 10:49 AM      Result Value Range   aPTT 24  24 - 37 seconds  CBC     Status: Abnormal   Collection Time    08/12/12 10:49 AM      Result Value Range   WBC 6.3  4.0 - 10.5 K/uL   RBC 4.53  4.22 - 5.81 MIL/uL   Hemoglobin 13.3  13.0 - 17.0 g/dL   HCT 16.1 (*) 09.6 - 04.5 %   MCV 85.4  78.0 - 100.0 fL   MCH 29.4  26.0 - 34.0 pg  MCHC 34.4  30.0 - 36.0 g/dL   RDW 84.6  96.2 - 95.2 %   Platelets 118 (*) 150 - 400 K/uL   Comment: SPECIMEN CHECKED FOR CLOTS     REPEATED TO VERIFY     PLATELET COUNT CONFIRMED BY SMEAR  PROTIME-INR     Status: None   Collection Time    08/12/12 10:49 AM      Result Value Range   Prothrombin Time 12.3  11.6 - 15.2 seconds   INR 0.92  0.00 - 1.49   No results found.  Review of Systems  Constitutional: Positive for malaise/fatigue. Negative for fever and chills.  Respiratory: Positive for cough and shortness of breath.   Cardiovascular: Negative for chest pain.  Gastrointestinal: Positive for vomiting. Negative for nausea and abdominal pain.       Odynophagia  Musculoskeletal: Negative for back pain.  Neurological: Negative for headaches.  Endo/Heme/Allergies: Does not bruise/bleed easily.      Vitals:  BP 137/88  HR 75  R 18  TEMP 98.1  O2 SATS 100% RA                                                                                                                                                                                                                          Physical Exam  Constitutional: He is oriented to person, place, and time. He appears well-developed and well-nourished.  Cardiovascular: Normal rate and regular rhythm.   Respiratory: Effort normal and breath sounds normal.  GI: Soft. Bowel sounds are normal. There is no tenderness.  Musculoskeletal: He exhibits no edema.  Neurological: He is alert and oriented to person, place, and time.     Assessment/Plan Pt with hx metastatic NSC lung carcinoma and hypermetabolic supraclavicular lymph nodes on recent PET scan. Plan is for US guided supraclavicular lymph node biopsy today. Details/risks of procedure d/w pt with his understanding and consent.  ALLRED,D KEVIN 08/12/2012, 12:20 PM

## 2012-08-12 NOTE — Procedures (Signed)
Procedure:  Ultrasound guided left supraclavicular lymph node biopsy Findings:  18 G core bx x 3 of left [place] lymph node.

## 2012-08-13 ENCOUNTER — Encounter: Payer: Self-pay | Admitting: Nutrition

## 2012-08-13 ENCOUNTER — Ambulatory Visit
Admission: RE | Admit: 2012-08-13 | Discharge: 2012-08-13 | Disposition: A | Discharge status: Home / Self Care | Payer: 59 | Source: Ambulatory Visit | Attending: Radiation Oncology | Admitting: Radiation Oncology

## 2012-08-13 ENCOUNTER — Encounter: Payer: Self-pay | Admitting: Radiation Oncology

## 2012-08-13 VITALS — BP 118/87 | HR 84 | Temp 98.2°F | Resp 20 | Wt 135.6 lb

## 2012-08-13 DIAGNOSIS — C3491 Malignant neoplasm of unspecified part of right bronchus or lung: Secondary | ICD-10-CM

## 2012-08-13 MED ORDER — HYDROCODONE-ACETAMINOPHEN 7.5-325 MG/15ML PO SOLN: 15.0000 mL | Freq: Four times a day (QID) | ORAL | Status: DC | PRN

## 2012-08-13 NOTE — Progress Notes (Signed)
[facility]    Radiation Oncology [address] [place]     Maryln Gottron, M.D. [place], [place] [address]               Billie Lade, M.D., Ph.D. Phone: [phone]      Molli Hazard A. Kathrynn Running, M.D. Fax: [phone]      Radene Gunning, M.D., Ph.D.         Lurline Hare, M.D.         Grayland Jack, M.D Weekly Treatment Management Note  Name: Terrance Clark     MRN: [id number]        CSN: [id number] Date: 08/13/2012      DOB: 02-15-1976  CC: Default, Provider, MD         Donata Clay    Status: Outpatient  Diagnosis: The encounter diagnosis was Non-small cell carcinoma of lung, right.  Current Dose: 36 Gy  Current Fraction: 20  Planned Dose: 63 Gy  Narrative: Terrance Clark was seen today for weekly treatment management. The chart was checked and CBCT  were reviewed. He continues to have pain with swallowing. He has run out of this hydrocodone and I have refilled this today. The patient is having fatigue but is contained worked his 2 regular jobs.  He also has some nausea early in the morning upon awakening.  Review of patient's allergies indicates no known allergies.  Current Outpatient Prescriptions  Medication Sig Dispense Refill  . dexamethasone (DECADRON) 4 MG tablet Take 4 mg by mouth 2 (two) times daily. He takes the day before, day of and day after receiving chemo treatments.      . folic acid (FOLVITE) 1 MG tablet Take 1 mg by mouth every morning.      Marland Kitchen HYDROcodone-acetaminophen (HYCET) 7.5-325 mg/15 ml solution Take 15 mLs by mouth 4 (four) times daily as needed for pain.  473 mL  0  . mirtazapine (REMERON) 30 MG tablet Take 30 mg by mouth at bedtime as needed (For sleep.).      Marland Kitchen ondansetron (ZOFRAN) 8 MG tablet Take 1 tablet (8 mg total) by mouth every 8 (eight) hours as needed for nausea.  20 tablet  2  . oxyCODONE (ROXICODONE) 5 MG immediate release tablet Take 1-2 tablets (5-10 mg total) by mouth every 4 (four) hours as needed for pain.  120 tablet  0  .  prochlorperazine (COMPAZINE) 10 MG tablet Take 1 tablet (10 mg total) by mouth every 6 (six) hours as needed (nausea and vomiting).  30 tablet  1  . traMADol (ULTRAM) 50 MG tablet Take 1 tablet (50 mg total) by mouth every 6 (six) hours as needed for pain.  30 tablet  1   No current facility-administered medications for this encounter.   Labs:  Lab Results  Component Value Date   WBC 6.3 08/12/2012   HGB 13.3 08/12/2012   HCT 38.7* 08/12/2012   MCV 85.4 08/12/2012   PLT 118* 08/12/2012   Lab Results  Component Value Date   CREATININE 0.9 08/12/2012   BUN 21.2 08/12/2012   NA 137 08/12/2012   K 3.5 08/12/2012   CL 99 08/12/2012   CO2 26 08/12/2012   Lab Results  Component Value Date   ALT 20 08/12/2012   AST 11 08/12/2012   BILITOT 0.27 08/12/2012    Physical Examination:  weight is 135 lb 9.6 oz (61.508 kg). His oral temperature is 98.2 F (36.8 C). His blood pressure is 118/87 and his  pulse is 84. His respiration is 20 and oxygen saturation is 100%.    Wt Readings from Last 3 Encounters:  08/13/12 135 lb 9.6 oz (61.508 kg)  08/06/12 132 lb 1.6 oz (59.92 kg)  08/06/12 132 lb 6.4 oz (60.056 kg)    The oral cavity is moist without secondary infection. The skin of the treatment area shows mild hyperpigmentation changes. Lungs - Normal respiratory effort, chest expands symmetrically. Lungs are clear to auscultation, no crackles or wheezes.  Heart has regular rhythm and rate  Abdomen is soft and non tender with normal bowel sounds  Assessment:  Patient tolerating treatments well except for above issues  Plan: Continue treatment per original radiation prescription

## 2012-08-13 NOTE — Progress Notes (Signed)
Pt denies pain except painful swallowing. He states Hycet is somewhat helpful, needs a refill today. He states he was unable to eat all day yesterday due to biopsy of lymph node of left neck. He states when he ate last night he vomitied. He is taking Compazine bid, states he vomits every morning before he takes med, but not every evening. Pt drinking 4-5 Ensures, Boosts daily. He states he will see Vernell Leep today for more samples. Pt reports prod cough w/clear sputum, SOB w/exertion.

## 2012-08-13 NOTE — Progress Notes (Signed)
I provided patient with his second complimentary case of Ensure Plus with additional coupons.

## 2012-08-14 ENCOUNTER — Ambulatory Visit
Admission: RE | Admit: 2012-08-14 | Discharge: 2012-08-14 | Disposition: A | Discharge status: Home / Self Care | Payer: 59 | Source: Ambulatory Visit | Attending: Radiation Oncology | Admitting: Radiation Oncology

## 2012-08-15 ENCOUNTER — Ambulatory Visit
Admission: RE | Admit: 2012-08-15 | Discharge: 2012-08-15 | Disposition: A | Discharge status: Home / Self Care | Payer: 59 | Source: Ambulatory Visit | Attending: Radiation Oncology | Admitting: Radiation Oncology

## 2012-08-16 ENCOUNTER — Ambulatory Visit
Admission: RE | Admit: 2012-08-16 | Discharge: 2012-08-16 | Disposition: A | Discharge status: Home / Self Care | Payer: 59 | Source: Ambulatory Visit | Attending: Radiation Oncology | Admitting: Radiation Oncology

## 2012-08-19 ENCOUNTER — Ambulatory Visit
Admission: RE | Admit: 2012-08-19 | Discharge: 2012-08-19 | Disposition: A | Discharge status: Home / Self Care | Payer: 59 | Source: Ambulatory Visit | Attending: Radiation Oncology | Admitting: Radiation Oncology

## 2012-08-19 ENCOUNTER — Telehealth: Payer: Self-pay | Admitting: Medical Oncology

## 2012-08-19 ENCOUNTER — Other Ambulatory Visit (HOSPITAL_BASED_OUTPATIENT_CLINIC_OR_DEPARTMENT_OTHER): Payer: 59 | Admitting: Lab

## 2012-08-19 ENCOUNTER — Encounter: Payer: Self-pay | Admitting: Radiation Oncology

## 2012-08-19 VITALS — BP 125/90 | HR 97 | Temp 98.2°F | Resp 20 | Wt 138.5 lb

## 2012-08-19 DIAGNOSIS — C3491 Malignant neoplasm of unspecified part of right bronchus or lung: Secondary | ICD-10-CM

## 2012-08-19 DIAGNOSIS — C3490 Malignant neoplasm of unspecified part of unspecified bronchus or lung: Secondary | ICD-10-CM

## 2012-08-19 LAB — COMPREHENSIVE METABOLIC PANEL (CC13)
AST: 11 U/L (ref 5–34)
Albumin: 3.4 g/dL — ABNORMAL LOW (ref 3.5–5.0)
Alkaline Phosphatase: 85 U/L (ref 40–150)
BUN: 21 mg/dL (ref 7.0–26.0)
Calcium: 9.7 mg/dL (ref 8.4–10.4)
Chloride: 99 mEq/L (ref 98–107)
Potassium: 3.6 mEq/L (ref 3.5–5.1)
Sodium: 135 mEq/L — ABNORMAL LOW (ref 136–145)
Total Protein: 7.1 g/dL (ref 6.4–8.3)

## 2012-08-19 LAB — CBC WITH DIFFERENTIAL/PLATELET
Basophils Absolute: 0 10*3/uL (ref 0.0–0.1)
EOS%: 0 % (ref 0.0–7.0)
Eosinophils Absolute: 0 10*3/uL (ref 0.0–0.5)
HGB: 14.2 g/dL (ref 13.0–17.1)
MCH: 29.4 pg (ref 27.2–33.4)
MCV: 86.3 fL (ref 79.3–98.0)
MONO%: 5.6 % (ref 0.0–14.0)
NEUT#: 5.2 10*3/uL (ref 1.5–6.5)
RBC: 4.85 10*6/uL (ref 4.20–5.82)
RDW: 13.8 % (ref 11.0–14.6)
lymph#: 0.4 10*3/uL — ABNORMAL LOW (ref 0.9–3.3)

## 2012-08-19 MED ORDER — SUCRALFATE 1 GM/10ML PO SUSP: 1.0000 g | Freq: Four times a day (QID) | ORAL | Status: DC

## 2012-08-19 MED ORDER — LIDOCAINE VISCOUS 2 % MT SOLN: 15.0000 mL | OROMUCOSAL | Status: DC | PRN

## 2012-08-19 MED ORDER — FENTANYL 25 MCG/HR TD PT72: 1.0000 | MEDICATED_PATCH | TRANSDERMAL | Status: DC

## 2012-08-19 MED ORDER — LORAZEPAM 0.5 MG PO TABS: 0.5000 mg | ORAL_TABLET | Freq: Three times a day (TID) | ORAL | Status: DC

## 2012-08-19 MED ORDER — HYDROMORPHONE HCL 4 MG PO TABS: 4.0000 mg | ORAL_TABLET | ORAL | Status: DC | PRN

## 2012-08-19 NOTE — Telephone Encounter (Signed)
Mary from [facility] called to report that Dr Roselind Messier is giving pt a treatment break the rest of this week .

## 2012-08-19 NOTE — Progress Notes (Addendum)
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 I can't eat or drink because of my throat hurting and throwing up." Pt states none of his pain meds relieve his painful throat and none of his other meds relieve his nausea. He states "everything smells and tastes like chemo". Pt states "I am hungry but every time I eat it comes right back up. It feels like it gets stuck." Pt states he was drinking 5-6 Ensures daily but only drank 1-2 over weekend. Pt also states he is having hiccups. He states he has had episodes of feeling like he couldn't catch his breath.  Pt is not orthostatic this morning. Pt applying Radiaplex to chest twice daily.  Pt requesting a new med for throat pain and nausea.

## 2012-08-19 NOTE — Progress Notes (Signed)
[facility]    Radiation Oncology [address] [place]     Maryln Gottron, M.D. [place], [place] [address]               Billie Lade, M.D., Ph.D. Phone: [phone]      Molli Hazard A. Kathrynn Running, M.D. Fax: [phone]      Radene Gunning, M.D., Ph.D.         Lurline Hare, M.D.         Grayland Jack, M.D Weekly Treatment Management Note  Name: Terrance Clark     MRN: [id number]        CSN: [id number] Date: 08/19/2012      DOB: [birth year]  CC: Default, Provider, MD         Donata Clay    Status: Outpatient  Diagnosis: The encounter diagnosis was Non-small cell carcinoma of lung, right.  Current Dose: 43.2 Gy  Current Fraction: 24  Planned Dose: 63 Gy  Narrative: Terrance Clark was seen today for weekly treatment management. The chart was checked and CBCT  were reviewed. The past few days his throat has worsened significantly. He is barely able to take in liquids at this time. He does complain of some dizziness with standing. I did recommend the patient receive IV fluid supplementation today and possibly later this week. At This time the patient does not wish to proceed with this recommendation. In light of patient's symptoms I will hold his radiation for later this week and resume on Monday, June 16. Patient's medications have been adjusted as below. He will take Dilaudid for short-acting medication and will be placed on a Duragesic patch. He also complains of gagging and some nausea and I've given him a prescription for sublingual Ativan to use. Also placed on Viscous Xylocaine and Carafate.  He is out all of his pain medications except Roxicodone. I've asked he not take this along with his Dilaudid.  Review of patient's allergies indicates no known allergies.  Current Outpatient Prescriptions  Medication Sig Dispense Refill  . dexamethasone (DECADRON) 4 MG tablet Take 4 mg by mouth 2 (two) times daily. He takes the day before, day of and day after receiving chemo treatments.       . fentaNYL (DURAGESIC - DOSED MCG/HR) 25 MCG/HR Place 1 patch (25 mcg total) onto the skin every 3 (three) days.  5 patch  0  . folic acid (FOLVITE) 1 MG tablet Take 1 mg by mouth every morning.      Marland Kitchen HYDROcodone-acetaminophen (HYCET) 7.5-325 mg/15 ml solution Take 15 mLs by mouth 4 (four) times daily as needed for pain.  473 mL  0  . HYDROmorphone (DILAUDID) 4 MG tablet Take 1 tablet (4 mg total) by mouth every 4 (four) hours as needed for pain.  60 tablet  0  . lidocaine (XYLOCAINE) 2 % solution Take 15 mLs by mouth as needed for pain.  100 mL  0  . LORazepam (ATIVAN) 0.5 MG tablet Place 1 tablet (0.5 mg total) under the tongue every 8 (eight) hours.  30 tablet  0  . mirtazapine (REMERON) 30 MG tablet Take 30 mg by mouth at bedtime as needed (For sleep.).      Marland Kitchen ondansetron (ZOFRAN) 8 MG tablet Take 1 tablet (8 mg total) by mouth every 8 (eight) hours as needed for nausea.  20 tablet  2  . oxyCODONE (ROXICODONE) 5 MG immediate release tablet Take 1-2 tablets (5-10 mg total) by mouth every 4 (four)  hours as needed for pain.  120 tablet  0  . prochlorperazine (COMPAZINE) 10 MG tablet Take 1 tablet (10 mg total) by mouth every 6 (six) hours as needed (nausea and vomiting).  30 tablet  1  . sucralfate (CARAFATE) 1 GM/10ML suspension Take 10 mLs (1 g total) by mouth 4 (four) times daily.  420 mL  0  . traMADol (ULTRAM) 50 MG tablet Take 1 tablet (50 mg total) by mouth every 6 (six) hours as needed for pain.  30 tablet  1   No current facility-administered medications for this encounter.   Labs:  Lab Results  Component Value Date   WBC 6.0 08/19/2012   HGB 14.2 08/19/2012   HCT 41.8 08/19/2012   MCV 86.3 08/19/2012   PLT 118* 08/19/2012   Lab Results  Component Value Date   CREATININE 0.9 08/19/2012   BUN 21.0 08/19/2012   NA 135* 08/19/2012   K 3.6 08/19/2012   CL 99 08/19/2012   CO2 25 08/19/2012   Lab Results  Component Value Date   ALT 30 08/19/2012   AST 11 08/19/2012   BILITOT 0.37 08/19/2012     Physical Examination:  weight is 138 lb 8 oz (62.823 kg). His oral temperature is 98.2 F (36.8 C). His blood pressure is 125/90 and his pulse is 97. His respiration is 20 and oxygen saturation is 100%.    Wt Readings from Last 3 Encounters:  08/19/12 138 lb 8 oz (62.823 kg)  08/13/12 135 lb 9.6 oz (61.508 kg)  08/06/12 132 lb 1.6 oz (59.92 kg)    The oral cavity is somewhat dry without secondary infection. The lower neck and upper chest region shows hyperpigmentation changes without any skin breakdown. Lungs - Normal respiratory effort, chest expands symmetrically. Lungs are clear to auscultation, no crackles or wheezes.  Heart has regular rhythm and rate  Abdomen is soft and non tender with normal bowel sounds  Assessment:  Patient is having side effects as above with this treatment  Plan: He will be on break until June 16. Patient has 11 treatments remaining.

## 2012-08-19 NOTE — Progress Notes (Signed)
At Dr Trina Ao request, called pt and left vm instructing him to take Viscous Xylocaine 15-20 min prior to eating meals. Repeated these instructions and left call back name and number for pt in the event he has questions.

## 2012-08-19 NOTE — Addendum Note (Signed)
Encounter addended by: Glennie Hawk, RN on: 08/19/2012 12:59 PM<BR>     Documentation filed: Notes Section

## 2012-08-20 ENCOUNTER — Ambulatory Visit: Payer: 59

## 2012-08-21 ENCOUNTER — Ambulatory Visit: Payer: 59

## 2012-08-22 ENCOUNTER — Ambulatory Visit: Payer: 59

## 2012-08-22 ENCOUNTER — Telehealth: Payer: Self-pay | Admitting: *Deleted

## 2012-08-22 NOTE — Telephone Encounter (Signed)
Called pt to check on his pain, nausea status after receiving new scripts on 08/1012. Left vm requesting call back w/call back name and number.

## 2012-08-23 ENCOUNTER — Ambulatory Visit: Payer: 59

## 2012-08-23 ENCOUNTER — Telehealth: Payer: Self-pay | Admitting: *Deleted

## 2012-08-23 NOTE — Telephone Encounter (Signed)
Called pt to check his status re: pain, nausea. Pt states he "picked up the medicines yesterday but is still vomiting." He states his pain patch "came off in the tub " but he replaced it. He states he has continued to vomit 3-6 times a day but has not vomited today. Pt states he is going to try and eat some chicken today. He states he is taking "all the medicines" for nausea. Advised pt to take Ativan for nausea regularly as directed on bottle and if his condition worsens, he should go to urgent care or ED over weekend. Pt verbalized understanding. Also instructed pt to come to nursing after treatment Monday for weight and VS check, Epic and [facility] schedules flagged to send pt to nursing on 08/26/12. Pt verbalized understanding, agreement of above.

## 2012-08-26 ENCOUNTER — Telehealth: Payer: Self-pay | Admitting: *Deleted

## 2012-08-26 ENCOUNTER — Ambulatory Visit
Admission: RE | Admit: 2012-08-26 | Discharge: 2012-08-26 | Disposition: A | Discharge status: Home / Self Care | Payer: 59 | Source: Ambulatory Visit | Attending: Radiation Oncology | Admitting: Radiation Oncology

## 2012-08-26 ENCOUNTER — Other Ambulatory Visit: Payer: Self-pay | Admitting: Medical Oncology

## 2012-08-26 ENCOUNTER — Encounter: Payer: 59 | Admitting: Nutrition

## 2012-08-26 ENCOUNTER — Encounter: Payer: Self-pay | Admitting: Internal Medicine

## 2012-08-26 ENCOUNTER — Ambulatory Visit: Payer: 59

## 2012-08-26 ENCOUNTER — Ambulatory Visit (HOSPITAL_BASED_OUTPATIENT_CLINIC_OR_DEPARTMENT_OTHER): Payer: 59 | Admitting: Internal Medicine

## 2012-08-26 ENCOUNTER — Telehealth: Payer: Self-pay | Admitting: Internal Medicine

## 2012-08-26 ENCOUNTER — Other Ambulatory Visit (HOSPITAL_BASED_OUTPATIENT_CLINIC_OR_DEPARTMENT_OTHER): Payer: 59 | Admitting: Lab

## 2012-08-26 VITALS — BP 120/80 | HR 99 | Temp 98.0°F | Resp 18 | Ht 71.0 in | Wt 132.5 lb

## 2012-08-26 DIAGNOSIS — C3491 Malignant neoplasm of unspecified part of right bronchus or lung: Secondary | ICD-10-CM

## 2012-08-26 DIAGNOSIS — C3490 Malignant neoplasm of unspecified part of unspecified bronchus or lung: Secondary | ICD-10-CM

## 2012-08-26 LAB — CBC WITH DIFFERENTIAL/PLATELET
BASO%: 0.2 % (ref 0.0–2.0)
EOS%: 0 % (ref 0.0–7.0)
MCH: 28.6 pg (ref 27.2–33.4)
MCHC: 33.3 g/dL (ref 32.0–36.0)
MCV: 85.7 fL (ref 79.3–98.0)
MONO%: 9.1 % (ref 0.0–14.0)
RBC: 4.55 10*6/uL (ref 4.20–5.82)
RDW: 14 % (ref 11.0–14.6)
lymph#: 0.3 10*3/uL — ABNORMAL LOW (ref 0.9–3.3)
nRBC: 0 % (ref 0–0)

## 2012-08-26 MED ORDER — SODIUM CHLORIDE 0.9 % IV SOLN: Freq: Once | INTRAVENOUS | Status: DC

## 2012-08-26 NOTE — Progress Notes (Signed)
No treatment today.

## 2012-08-26 NOTE — Telephone Encounter (Signed)
Dialudid was picked up on 6/9

## 2012-08-26 NOTE — Patient Instructions (Signed)
We will delay her chemotherapy to start next week. IV fluids today. Followup visit in one month with repeat CT scan of the chest, abdomen and pelvis for restaging of your disease.

## 2012-08-26 NOTE — Progress Notes (Signed)
Pt in nursing after radiation treatment for weight/VS check; he was on treatment break last week, Tues -Fri after receiving new scripts for nausea, pain. Pt states "nothing is working. I vomited all weekend, and my throat is still hurting." Pt states he has not had any relief at all w/nausea/vomiting, pain in his throat. He states "the food gets right here (indicating mid sternum) and it won't go down. It comes right back up." Pt states he is "taking all the medicines he was given and nothing is working". Pt expressed concern as to why he is not given a medicine that will work. Explained that Dr Roselind Messier gave him new prescriptions last Tues in the hopes that he would get relief of his painful swallowing and nausea. Pt states he took Duragesic patch off this morning, did not replace it "because it didn't work". Pt was speaking loudly, not allowing this RN either to speak at all or complete a sentence at the beginning of this encounter. He also stated he had "surgery on his neck and no one has called him with the results". Then pt became tearful for short while.  Pt then was quiet and seemed to be listening to explanation of temporary side effects from radiation treatment. Encouraged pt many times to discuss his concerns w/Dr Arbutus Ped and his RN during his medical oncology appointment today, and informed him Dr Roselind Messier is out of office today but pt will see Dr Roselind Messier tomorrow. Informed pt will discuss all his concerns/complaints w/Dr Roselind Messier. Pt became quiet, stated he would go see Dr Arbutus Ped now. Continued to encourage him to discuss all his concerns w/medical oncology today. Pt accompanied by friend today. This RN called Loretha Brasil, SW and notified him of above encounter. He stated he would discuss w/B Penelope Coop who is scheduled to see pt today. Z Manson Passey stated he would try to see pt today while in infusion area.

## 2012-08-26 NOTE — Telephone Encounter (Signed)
Per staff phone call and POF I have schedueld appts.  JMW  

## 2012-08-26 NOTE — Progress Notes (Signed)
[facility] [phone]   [phone]  OFFICE PROGRESS NOTE  Default, Provider, MD [address] [place] [address]  DIAGNOSIS: Metastatic non-small cell lung cancer, adenocarcinoma with unknown status of the EGFR mutation or ALK gene translocation secondary to insufficient material, diagnosed in April 2014.   PRIOR THERAPY:  1) stereotactic radiotherapy under the care of Dr. Kathrynn Running completed on 07/18/2012.   CURRENT THERAPY:  1) Systemic chemotherapy with carboplatin for AUC of 5 and Alimta 500 mg/M2 giving every 3 weeks, status post 1 cycle. First dose was given on 07/15/2012.  2) palliative radiotherapy to the left upper lobe and mediastinal lymph nodes.   INTERVAL HISTORY: Leul Narramore 37 y.o. male returns to the clinic today for followup visit accompanied by his brother. The patient continues to complain of pain in the neck area as well as sore throat and odynophagia secondary to radiation induced esophagitis. He is currently on treatment with Carafate, fentanyl patch as well as Dilaudid. He also complaining of increasing fatigue and weakness and occasional nausea. He has been off radiotherapy for last week. He denied having any other significant complaints. He has no chest pain or shortness of breath. He underwent CT-guided core biopsy of the left supraclavicular lymph node to have enough tissue for molecular study. The final pathology was consistent with adenocarcinoma but the molecular studies are still pending.   MEDICAL HISTORY: Past Medical History  Diagnosis Date  . H/O oral surgery 04/2012  . Cough   . Shortness of breath     with exertion  . Insomnia     d/t pain and takes Goody's PM    ALLERGIES:  has No Known Allergies.  MEDICATIONS:  Current Outpatient Prescriptions  Medication Sig Dispense Refill  . fentaNYL (DURAGESIC - DOSED MCG/HR) 25 MCG/HR Place 1 patch (25 mcg total) onto the skin every 3 (three) days.  5 patch  0    . folic acid (FOLVITE) 1 MG tablet Take 1 mg by mouth every morning.      Marland Kitchen HYDROcodone-acetaminophen (HYCET) 7.5-325 mg/15 ml solution Take 15 mLs by mouth 4 (four) times daily as needed for pain.  473 mL  0  . LORazepam (ATIVAN) 0.5 MG tablet Place 1 tablet (0.5 mg total) under the tongue every 8 (eight) hours.  30 tablet  0  . mirtazapine (REMERON) 30 MG tablet Take 30 mg by mouth at bedtime as needed (For sleep.).      Marland Kitchen oxyCODONE (ROXICODONE) 5 MG immediate release tablet Take 1-2 tablets (5-10 mg total) by mouth every 4 (four) hours as needed for pain.  120 tablet  0  . sucralfate (CARAFATE) 1 GM/10ML suspension Take 10 mLs (1 g total) by mouth 4 (four) times daily.  420 mL  0  . dexamethasone (DECADRON) 4 MG tablet Take 4 mg by mouth 2 (two) times daily. He takes the day before, day of and day after receiving chemo treatments.      Marland Kitchen HYDROmorphone (DILAUDID) 4 MG tablet Take 1 tablet (4 mg total) by mouth every 4 (four) hours as needed for pain.  60 tablet  0  . lidocaine (XYLOCAINE) 2 % solution Take 15 mLs by mouth as needed for pain.  100 mL  0  . ondansetron (ZOFRAN) 8 MG tablet Take 1 tablet (8 mg total) by mouth every 8 (eight) hours as needed for nausea.  20 tablet  2  . prochlorperazine (COMPAZINE) 10 MG tablet Take 1  tablet (10 mg total) by mouth every 6 (six) hours as needed (nausea and vomiting).  30 tablet  1  . traMADol (ULTRAM) 50 MG tablet Take 1 tablet (50 mg total) by mouth every 6 (six) hours as needed for pain.  30 tablet  1   No current facility-administered medications for this visit.    SURGICAL HISTORY:  Past Surgical History  Procedure Laterality Date  . Foot surgery    . Mouth surgery    . Video bronchoscopy with endobronchial ultrasound Right 06/19/2012    Procedure: VIDEO BRONCHOSCOPY WITH ENDOBRONCHIAL ULTRASOUND;  Surgeon: Leslye Peer, MD;  Location: [facility];  Service: Pulmonary;  Laterality: Right;    REVIEW OF SYSTEMS:  A comprehensive review of systems  was negative except for: Constitutional: positive for fatigue Ears, nose, mouth, throat, and face: positive for sore throat Gastrointestinal: positive for odynophagia   PHYSICAL EXAMINATION: General appearance: alert, cooperative, fatigued and no distress Head: Normocephalic, without obvious abnormality, atraumatic Neck: no adenopathy Lymph nodes: Cervical, supraclavicular, and axillary nodes normal. Resp: clear to auscultation bilaterally Cardio: regular rate and rhythm, S1, S2 normal, no murmur, click, rub or gallop GI: soft, non-tender; bowel sounds normal; no masses,  no organomegaly Extremities: extremities normal, atraumatic, no cyanosis or edema Neurologic: Alert and oriented X 3, normal strength and tone. Normal symmetric reflexes. Normal coordination and gait  ECOG PERFORMANCE STATUS: 1 - Symptomatic but completely ambulatory  Blood pressure 120/80, pulse 99, temperature 98 F (36.7 C), temperature source Oral, resp. rate 18, height 5\' 11"  (1.803 m), weight 132 lb 8 oz (60.102 kg).  LABORATORY DATA: Lab Results  Component Value Date   WBC 4.8 08/26/2012   HGB 13.0 08/26/2012   HCT 39.0 08/26/2012   MCV 85.7 08/26/2012   PLT 120* 08/26/2012      Chemistry      Component Value Date/Time   NA 135* 08/19/2012 0933   NA 141 06/19/2012 0800   K 3.6 08/19/2012 0933   K 3.9 06/19/2012 0800   CL 99 08/19/2012 0933   CL 105 06/19/2012 0800   CO2 25 08/19/2012 0933   CO2 25 06/19/2012 0800   BUN 21.0 08/19/2012 0933   BUN 18 06/19/2012 0800   CREATININE 0.9 08/19/2012 0933   CREATININE 0.86 06/19/2012 0800      Component Value Date/Time   CALCIUM 9.7 08/19/2012 0933   CALCIUM 9.5 06/19/2012 0800   ALKPHOS 85 08/19/2012 0933   AST 11 08/19/2012 0933   ALT 30 08/19/2012 0933   BILITOT 0.37 08/19/2012 0933       RADIOGRAPHIC STUDIES: US Biopsy  08/12/2012   *RADIOLOGY REPORT*  Clinical Data: Non-small cell lung carcinoma and hypermetabolic bilateral supraclavicular adenopathy by PET scan.  The patient  presents for biopsy to confirm the presence of metastatic adenopathy.  ULTRASOUND GUIDED CORE BIOPSY OF LEFT SUPRACLAVICULAR LYMPH NODE  Sedation:  4.0 mg IV Versed;  100 mcg IV Fentanyl  Total Moderate Sedation Time: 15 minutes.  Procedure:  The procedure, risks, benefits, and alternatives were explained to the patient.  Questions regarding the procedure were encouraged and answered.  The patient understands and consents to the procedure.  The left neck was prepped with Betadine in a sterile fashion, and a sterile drape was applied covering the operative field.  A sterile gown and sterile gloves were used for the procedure. Local anesthesia was provided with 1% Lidocaine.  Ultrasound was performed of both supraclavicular regions.  On the left side, three separate  18 gauge core biopsy samples were obtained of an enlarged supraclavicular node.  Solid tissue was obtained.  Material was submitted in formalin for pathologic analysis.  Complications: None  Findings: Bilateral supraclavicular lymph nodes are identified lateral to the jugular vein.  The left side showed a larger node measuring approximately 1.9 cm in greatest diameter and in a more safe position for needle biopsy.  Solid tissue was obtained.  IMPRESSION: Ultrasound guided core biopsy performed of an enlarged left supraclavicular lymph node.   Original Report Authenticated By: Irish Lack, M.D.    ASSESSMENT AND PLAN: This is a very pleasant 37 years old African American male with metastatic non-small cell lung cancer, adenocarcinoma currently undergoing systemic chemotherapy with carboplatin and Alimta status post 2 cycles in addition to palliative radiation to the left upper lobe lung mass as well as mediastinal lymphadenopathy. The patient has rough time with his radiotherapy at this point with significant sore throat as well as radiation induced esophagitis. He is currently off radiation treatment.  He also has lack of appetite but was able to  maintain his weight. I will delay the start of cycle #3 until next week. I will arrange for the patient to receive 1 L of normal saline tomorrow for hydration. I would see the patient back for followup visit in 4 weeks with repeat CT scan of the chest, abdomen and pelvis for restaging of his disease. He was advised to call immediately if he has any concerning symptoms in the interval. All questions were answered. The patient knows to call the clinic with any problems, questions or concerns. We can certainly see the patient much sooner if necessary.  I spent 15 minutes counseling the patient face to face. The total time spent in the appointment was 25 minutes.

## 2012-08-27 ENCOUNTER — Ambulatory Visit
Admission: RE | Admit: 2012-08-27 | Discharge: 2012-08-27 | Disposition: A | Discharge status: Home / Self Care | Payer: 59 | Source: Ambulatory Visit | Attending: Radiation Oncology | Admitting: Radiation Oncology

## 2012-08-27 ENCOUNTER — Other Ambulatory Visit: Payer: Self-pay | Admitting: *Deleted

## 2012-08-27 ENCOUNTER — Encounter: Payer: Self-pay | Admitting: *Deleted

## 2012-08-27 ENCOUNTER — Ambulatory Visit: Payer: 59 | Admitting: Nutrition

## 2012-08-27 ENCOUNTER — Ambulatory Visit (HOSPITAL_BASED_OUTPATIENT_CLINIC_OR_DEPARTMENT_OTHER): Payer: 59

## 2012-08-27 ENCOUNTER — Encounter: Payer: Self-pay | Admitting: Radiation Oncology

## 2012-08-27 VITALS — BP 129/89 | HR 99 | Temp 98.1°F | Resp 20 | Wt 135.3 lb

## 2012-08-27 VITALS — BP 124/69 | HR 95 | Temp 99.3°F | Resp 18

## 2012-08-27 DIAGNOSIS — E860 Dehydration: Secondary | ICD-10-CM

## 2012-08-27 DIAGNOSIS — C3490 Malignant neoplasm of unspecified part of unspecified bronchus or lung: Secondary | ICD-10-CM

## 2012-08-27 DIAGNOSIS — C3491 Malignant neoplasm of unspecified part of right bronchus or lung: Secondary | ICD-10-CM

## 2012-08-27 MED ORDER — ONDANSETRON 8 MG/50ML IVPB (CHCC)
8.0000 mg | Freq: Once | INTRAVENOUS | Status: AC
  Administered 2012-08-27: 8 mg via INTRAVENOUS

## 2012-08-27 MED ORDER — LIDOCAINE VISCOUS 2 % MT SOLN: 15.0000 mL | Freq: Three times a day (TID) | OROMUCOSAL | Status: DC

## 2012-08-27 MED ORDER — FENTANYL 25 MCG/HR TD PT72: 1.0000 | MEDICATED_PATCH | TRANSDERMAL | Status: DC

## 2012-08-27 MED ORDER — ONDANSETRON HCL 8 MG PO TABS: 8.0000 mg | ORAL_TABLET | Freq: Three times a day (TID) | ORAL | Status: DC | PRN

## 2012-08-27 MED ORDER — PROCHLORPERAZINE MALEATE 10 MG PO TABS: 10.0000 mg | ORAL_TABLET | Freq: Four times a day (QID) | ORAL | Status: DC | PRN

## 2012-08-27 MED ORDER — SUCRALFATE 1 GM/10ML PO SUSP: 1.0000 g | Freq: Four times a day (QID) | ORAL | Status: DC

## 2012-08-27 MED ORDER — SODIUM CHLORIDE 0.9 % IV SOLN
INTRAVENOUS | Status: DC
  Administered 2012-08-27: 10:00:00 via INTRAVENOUS

## 2012-08-27 MED ORDER — FLUCONAZOLE 100 MG PO TABS: 100.0000 mg | ORAL_TABLET | Freq: Every day | ORAL | Status: DC

## 2012-08-27 MED ORDER — LORAZEPAM 0.5 MG PO TABS: 0.5000 mg | ORAL_TABLET | Freq: Three times a day (TID) | ORAL | Status: DC

## 2012-08-27 NOTE — Patient Instructions (Addendum)
Dehydration, Adult Dehydration is when you lose more fluids from the body than you take in. Vital organs like the kidneys, brain, and heart cannot function without a proper amount of fluids and salt. Any loss of fluids from the body can cause dehydration.  CAUSES   Vomiting.  Diarrhea.  Excessive sweating.  Excessive urine output.  Fever. SYMPTOMS  Mild dehydration  Thirst.  Dry lips.  Slightly dry mouth. Moderate dehydration  Very dry mouth.  Sunken eyes.  Skin does not bounce back quickly when lightly pinched and released.  Dark urine and decreased urine production.  Decreased tear production.  Headache. Severe dehydration  Very dry mouth.  Extreme thirst.  Rapid, weak pulse (more than 100 beats per minute at rest).  Cold hands and feet.  Not able to sweat in spite of heat and temperature.  Rapid breathing.  Blue lips.  Confusion and lethargy.  Difficulty being awakened.  Minimal urine production.  No tears. DIAGNOSIS  Your caregiver will diagnose dehydration based on your symptoms and your exam. Blood and urine tests will help confirm the diagnosis. The diagnostic evaluation should also identify the cause of dehydration. TREATMENT  Treatment of mild or moderate dehydration can often be done at home by increasing the amount of fluids that you drink. It is best to drink small amounts of fluid more often. Drinking too much at one time can make vomiting worse. Refer to the home care instructions below. Severe dehydration needs to be treated at the hospital where you will probably be given intravenous (IV) fluids that contain water and electrolytes. HOME CARE INSTRUCTIONS   Ask your caregiver about specific rehydration instructions.  Drink enough fluids to keep your urine clear or pale yellow.  Drink small amounts frequently if you have nausea and vomiting.  Eat as you normally do.  Avoid:  Foods or drinks high in sugar.  Carbonated  drinks.  Juice.  Extremely hot or cold fluids.  Drinks with caffeine.  Fatty, greasy foods.  Alcohol.  Tobacco.  Overeating.  Gelatin desserts.  Wash your hands well to avoid spreading bacteria and viruses.  Only take over-the-counter or prescription medicines for pain, discomfort, or fever as directed by your caregiver.  Ask your caregiver if you should continue all prescribed and over-the-counter medicines.  Keep all follow-up appointments with your caregiver. SEEK MEDICAL CARE IF:  You have abdominal pain and it increases or stays in one area (localizes).  You have a rash, stiff neck, or severe headache.  You are irritable, sleepy, or difficult to awaken.  You are weak, dizzy, or extremely thirsty. SEEK IMMEDIATE MEDICAL CARE IF:   You are unable to keep fluids down or you get worse despite treatment.  You have frequent episodes of vomiting or diarrhea.  You have blood or green matter (bile) in your vomit.  You have blood in your stool or your stool looks black and tarry.  You have not urinated in 6 to 8 hours, or you have only urinated a small amount of very dark urine.  You have a fever.  You faint. MAKE SURE YOU:   Understand these instructions.  Will watch your condition.  Will get help right away if you are not doing well or get worse. Document Released: 02/27/2005 Document Revised: 05/22/2011 Document Reviewed: 10/17/2010 [employer] Patient Information 2014 [employer].  

## 2012-08-27 NOTE — Progress Notes (Signed)
This morning I met with Lashun to introduce myself after learning about him from nursing. Since he was in the chemo room, we did not speak at length, but I was able to offer support. He responded positively in that he was already familiar with our social work team per his report. He knows we are available to him and will seek support if needed.  Gretta Cool, LCSW Assistant Director Clinical Social Work [phone] cell

## 2012-08-27 NOTE — Progress Notes (Signed)
Pt states Dilaudid is most helpful w/throat pain. He takes Oxycodone during the day, Dilaudid at night for pain relief. He is wearing Duragesic 25 mcg patch. He states Hycet was not helpful at all. Pt receiving IVF today, received Zofran 8 mg IV.  Pt states he had vomiting x 1 last night after drinking gator aid. He takes Ativan for nausea, needs refill today. Pt states he has not eaten but has been drinking liquids. Pt requests refill on Duragesic and Sucralfate liquid.

## 2012-08-27 NOTE — Progress Notes (Signed)
I spoke briefly with patient in the chemotherapy area. He reports he continues to have pain that is uncontrolled with pain medication. He is unable to eat very well or drink Ensure Plus. He continues to force himself to eat and drink. He does report nausea and vomiting. Patient's weight has remained basically stable at 132.5 pounds which was documented June 16.  Nutrition diagnosis: Inadequate oral intake continues.  Intervention: I provided patient's third and final case of Ensure Plus. I have encouraged patient to communicate with his healthcare team regarding pain control and nausea and vomiting. I educated patient to alter temperatures of foods for increased tolerance. Teach back method used.  Monitoring, evaluation, goals: Patient will tolerate adequate calories and protein to minimize weight loss throughout treatment.  Next visit: Monday, July 14. Patient agrees to contact me if he needs anything before that.

## 2012-08-27 NOTE — Progress Notes (Signed)
[facility]    Radiation Oncology [address] [place]     Maryln Gottron, M.D. [place], [place] [address]               Billie Lade, M.D., Ph.D. Phone: [phone]      Molli Hazard A. Kathrynn Running, M.D. Fax: [phone]      Radene Gunning, M.D., Ph.D.         Lurline Hare, M.D.         Grayland Jack, M.D Weekly Treatment Management Note  Name: Terrance Clark     MRN: [id number]        CSN: [id number] Date: 08/27/2012      DOB: 08-03-1975  CC: Default, Provider, MD         Donata Clay    Status: Outpatient  Diagnosis: The encounter diagnosis was Non-small cell carcinoma of lung, right.  Current Dose: 46.8 Gy  Current Fraction: 26  Planned Dose: 63 Gy  Narrative: [name] was seen today for weekly treatment management. The chart was checked and CBCT  were reviewed. He continues to have a lot of problems with nausea and pharyngitis/esophagitis. Patient's chemotherapy was held. He however is receiving IV fluids with IV Zofran today through medical oncology. He does complain of discomfort in the oral cavity and a poor taste  Review of patient's allergies indicates no known allergies. Current Outpatient Prescriptions  Medication Sig Dispense Refill  . dexamethasone (DECADRON) 4 MG tablet Take 4 mg by mouth 2 (two) times daily. He takes the day before, day of and day after receiving chemo treatments.      . fentaNYL (DURAGESIC - DOSED MCG/HR) 25 MCG/HR Place 1 patch (25 mcg total) onto the skin every 3 (three) days.  5 patch  0  . folic acid (FOLVITE) 1 MG tablet Take 1 mg by mouth every morning.      Marland Kitchen HYDROcodone-acetaminophen (HYCET) 7.5-325 mg/15 ml solution Take 15 mLs by mouth 4 (four) times daily as needed for pain.  473 mL  0  . HYDROmorphone (DILAUDID) 4 MG tablet Take 1 tablet (4 mg total) by mouth every 4 (four) hours as needed for pain.  60 tablet  0  . lidocaine (XYLOCAINE) 2 % solution Take 15 mLs by mouth 3 (three) times daily before meals.  100 mL  0  .  LORazepam (ATIVAN) 0.5 MG tablet Place 1 tablet (0.5 mg total) under the tongue every 8 (eight) hours.  30 tablet  0  . mirtazapine (REMERON) 30 MG tablet Take 30 mg by mouth at bedtime as needed (For sleep.).      Marland Kitchen ondansetron (ZOFRAN) 8 MG tablet Take 1 tablet (8 mg total) by mouth every 8 (eight) hours as needed (prn nausea vomiting).  30 tablet  1  . oxyCODONE (ROXICODONE) 5 MG immediate release tablet Take 1-2 tablets (5-10 mg total) by mouth every 4 (four) hours as needed for pain.  120 tablet  0  . prochlorperazine (COMPAZINE) 10 MG tablet Take 1 tablet (10 mg total) by mouth every 6 (six) hours as needed (nausea and vomiting).  30 tablet  1  . sucralfate (CARAFATE) 1 GM/10ML suspension Take 10 mLs (1 g total) by mouth 4 (four) times daily.  420 mL  0  . traMADol (ULTRAM) 50 MG tablet Take 1 tablet (50 mg total) by mouth every 6 (six) hours as needed for pain.  30 tablet  1  . fluconazole (DIFLUCAN) 100 MG tablet Take 1 tablet (  100 mg total) by mouth daily.  8 tablet  0   No current facility-administered medications for this encounter.   Facility-Administered Medications Ordered in Other Encounters  Medication Dose Route Frequency Provider Last Rate Last Dose  . 0.9 %  sodium chloride infusion   Intravenous Continuous Si Gaul, MD       Labs:  Lab Results  Component Value Date   WBC 4.8 08/26/2012   HGB 13.0 08/26/2012   HCT 39.0 08/26/2012   MCV 85.7 08/26/2012   PLT 120* 08/26/2012   Lab Results  Component Value Date   CREATININE 0.9 08/19/2012   BUN 21.0 08/19/2012   NA 135* 08/19/2012   K 3.6 08/19/2012   CL 99 08/19/2012   CO2 25 08/19/2012   Lab Results  Component Value Date   ALT 30 08/19/2012   AST 11 08/19/2012   BILITOT 0.37 08/19/2012    Physical Examination:  weight is 135 lb 4.8 oz (61.372 kg). His oral temperature is 98.1 F (36.7 C). His blood pressure is 129/89 and his pulse is 99. His respiration is 20 and oxygen saturation is 99%.    Wt Readings from Last 3  Encounters:  08/27/12 135 lb 4.8 oz (61.372 kg)  08/26/12 132 lb 8 oz (60.102 kg)  08/26/12 132 lb 12.8 oz (60.238 kg)   The lower neck and upper chest area shows hyperpigmentation changes but no skin breakdown is appreciated. The tongue has a whitish coating which would be suspicious for candida Lungs - Normal respiratory effort, chest expands symmetrically. Lungs are clear to auscultation, no crackles or wheezes.  Heart has regular rhythm and rate  Abdomen is soft and non tender with normal bowel sounds  Assessment:  Patient continues to have significant side effects with this treatment. He was on a break from his treatment late last week which is helped slightly.  Plan: Continue treatment per original radiation prescription. The patient be placed on Diflucan. I also refilled his Viscous Xylocaine,  Duragesic patch and Ativan. He will need refills on oxycodone and Dilaudid next week.

## 2012-08-28 ENCOUNTER — Ambulatory Visit
Admission: RE | Admit: 2012-08-28 | Discharge: 2012-08-28 | Disposition: A | Discharge status: Home / Self Care | Payer: 59 | Source: Ambulatory Visit | Attending: Radiation Oncology | Admitting: Radiation Oncology

## 2012-08-29 ENCOUNTER — Ambulatory Visit
Admission: RE | Admit: 2012-08-29 | Discharge: 2012-08-29 | Disposition: A | Discharge status: Home / Self Care | Payer: 59 | Source: Ambulatory Visit | Attending: Radiation Oncology | Admitting: Radiation Oncology

## 2012-08-30 ENCOUNTER — Ambulatory Visit
Admission: RE | Admit: 2012-08-30 | Discharge: 2012-08-30 | Disposition: A | Discharge status: Home / Self Care | Payer: 59 | Source: Ambulatory Visit | Attending: Radiation Oncology | Admitting: Radiation Oncology

## 2012-09-02 ENCOUNTER — Ambulatory Visit
Admission: RE | Admit: 2012-09-02 | Discharge: 2012-09-02 | Disposition: A | Discharge status: Home / Self Care | Payer: 59 | Source: Ambulatory Visit | Attending: Radiation Oncology | Admitting: Radiation Oncology

## 2012-09-02 ENCOUNTER — Encounter: Payer: Self-pay | Admitting: Medical Oncology

## 2012-09-02 ENCOUNTER — Other Ambulatory Visit (HOSPITAL_BASED_OUTPATIENT_CLINIC_OR_DEPARTMENT_OTHER): Payer: 59

## 2012-09-02 DIAGNOSIS — C3491 Malignant neoplasm of unspecified part of right bronchus or lung: Secondary | ICD-10-CM

## 2012-09-02 DIAGNOSIS — C3410 Malignant neoplasm of upper lobe, unspecified bronchus or lung: Secondary | ICD-10-CM

## 2012-09-02 DIAGNOSIS — R590 Localized enlarged lymph nodes: Secondary | ICD-10-CM

## 2012-09-02 LAB — CBC WITH DIFFERENTIAL/PLATELET
BASO%: 0.4 % (ref 0.0–2.0)
EOS%: 0.3 % (ref 0.0–7.0)
Eosinophils Absolute: 0 10*3/uL (ref 0.0–0.5)
MCHC: 34.4 g/dL (ref 32.0–36.0)
MCV: 85 fL (ref 79.3–98.0)
MONO%: 7.9 % (ref 0.0–14.0)
NEUT#: 3.2 10*3/uL (ref 1.5–6.5)
RBC: 4.01 10*6/uL — ABNORMAL LOW (ref 4.20–5.82)
RDW: 13.3 % (ref 11.0–14.6)

## 2012-09-02 LAB — COMPREHENSIVE METABOLIC PANEL (CC13)
ALT: 18 U/L (ref 0–55)
AST: 12 U/L (ref 5–34)
Albumin: 2.7 g/dL — ABNORMAL LOW (ref 3.5–5.0)
Alkaline Phosphatase: 100 U/L (ref 40–150)
Potassium: 3.2 mEq/L — ABNORMAL LOW (ref 3.5–5.1)
Sodium: 140 mEq/L (ref 136–145)
Total Protein: 6.3 g/dL — ABNORMAL LOW (ref 6.4–8.3)

## 2012-09-02 MED ORDER — BIAFINE EX EMUL
CUTANEOUS | Status: DC | PRN
  Administered 2012-09-02: 10:00:00 via TOPICAL

## 2012-09-02 NOTE — Progress Notes (Signed)
EGFR TKI alteration not detected.

## 2012-09-02 NOTE — Progress Notes (Signed)
lymph node tissue - ALK gene rearrangement Not detected

## 2012-09-02 NOTE — Progress Notes (Signed)
Quick Note:  Call patient with the result and order K Dur 20 meq po qd X 7 days ______ 

## 2012-09-03 ENCOUNTER — Encounter: Payer: Self-pay | Admitting: Radiation Oncology

## 2012-09-03 ENCOUNTER — Telehealth: Payer: Self-pay | Admitting: Medical Oncology

## 2012-09-03 ENCOUNTER — Ambulatory Visit
Admission: RE | Admit: 2012-09-03 | Discharge: 2012-09-03 | Disposition: A | Discharge status: Home / Self Care | Payer: 59 | Source: Ambulatory Visit | Attending: Radiation Oncology | Admitting: Radiation Oncology

## 2012-09-03 ENCOUNTER — Ambulatory Visit: Payer: 59

## 2012-09-03 VITALS — BP 122/76 | HR 95 | Temp 98.3°F | Resp 20 | Wt 134.3 lb

## 2012-09-03 DIAGNOSIS — E876 Hypokalemia: Secondary | ICD-10-CM

## 2012-09-03 DIAGNOSIS — C3491 Malignant neoplasm of unspecified part of right bronchus or lung: Secondary | ICD-10-CM

## 2012-09-03 MED ORDER — POTASSIUM CHLORIDE CRYS ER 20 MEQ PO TBCR: 20.0000 meq | EXTENDED_RELEASE_TABLET | Freq: Every day | ORAL | Status: DC

## 2012-09-03 MED ORDER — BIAFINE EX EMUL
Freq: Two times a day (BID) | CUTANEOUS | Status: DC
  Administered 2012-09-03: 13:00:00 via TOPICAL

## 2012-09-03 NOTE — Addendum Note (Signed)
Encounter addended by: Glennie Hawk, RN on: 09/03/2012  1:13 PM<BR>     Documentation filed: Orders, Inpatient MAR

## 2012-09-03 NOTE — Progress Notes (Signed)
Pt states he still has painful swallowing but is eating better. He states he coughed up mucus w/specks of blood and small blood clots x 2 days. Pt c/o SOB w/exertion and at times when he is lying flat. Pt c/o hiccups and feeling like he cannot breath well when he has hiccups. He states some days he is nauseated but others he is not, fatigued, still working 2 jobs. He did leave work early yesterday and has today off. Pt has some darkening of skin of chest in treatment area; he c/o burn and itching. Gave him Biafine lotion to apply. Pt states the Duragesic patch keeps falling off. Gave him alcohol swabs to cleanse skin , let dry and then apply patch. Pt states he has 2 prescriptions at [facility] that he couldn't pick up until today due to unavailability. He is unsure which scripts these are. Pt states he is out of all pain meds.

## 2012-09-03 NOTE — Progress Notes (Signed)
[facility]    Radiation Oncology [address] [place]     Maryln Gottron, M.D. [place], [place] [address]               Billie Lade, M.D., Ph.D. Phone: [phone]      Molli Hazard A. Kathrynn Running, M.D. Fax: [phone]      Radene Gunning, M.D., Ph.D.         Lurline Hare, M.D.         Grayland Jack, M.D Weekly Treatment Management Note  Name: Terrance Clark     MRN: [id number]        CSN: [id number] Date: 09/03/2012      DOB: 11-May-1975  CC: Default, Provider, MD         Donata Clay    Status: Outpatient  Diagnosis: The encounter diagnosis was Non-small cell carcinoma of lung, right.  Current Dose: 54 Gy  Current Fraction: 30  Planned Dose: 63 Gy  Narrative: Terrance Clark was seen today for weekly treatment management. The chart was checked and CBCT  were reviewed. His pain seems to be a little better this week. He is having more fatigue and did request a note to stay out of work.  He is also had some mild hemoptysis with coughing.  He denies any chills or fever. He denies any Green sputum production. He is having itching in the radiation portals and I've given him some Biafine to use  Review of patient's allergies indicates no known allergies.  Current Outpatient Prescriptions  Medication Sig Dispense Refill  . dexamethasone (DECADRON) 4 MG tablet Take 4 mg by mouth 2 (two) times daily. He takes the day before, day of and day after receiving chemo treatments.      . fentaNYL (DURAGESIC - DOSED MCG/HR) 25 MCG/HR Place 1 patch (25 mcg total) onto the skin every 3 (three) days.  5 patch  0  . fluconazole (DIFLUCAN) 100 MG tablet Take 1 tablet (100 mg total) by mouth daily.  8 tablet  0  . folic acid (FOLVITE) 1 MG tablet Take 1 mg by mouth every morning.      Marland Kitchen HYDROcodone-acetaminophen (HYCET) 7.5-325 mg/15 ml solution Take 15 mLs by mouth 4 (four) times daily as needed for pain.  473 mL  0  . HYDROmorphone (DILAUDID) 4 MG tablet Take 1 tablet (4 mg total) by mouth  every 4 (four) hours as needed for pain.  60 tablet  0  . lidocaine (XYLOCAINE) 2 % solution Take 15 mLs by mouth 3 (three) times daily before meals.  100 mL  0  . LORazepam (ATIVAN) 0.5 MG tablet Place 1 tablet (0.5 mg total) under the tongue every 8 (eight) hours.  30 tablet  0  . mirtazapine (REMERON) 30 MG tablet Take 30 mg by mouth at bedtime as needed (For sleep.).      Marland Kitchen ondansetron (ZOFRAN) 8 MG tablet Take 1 tablet (8 mg total) by mouth every 8 (eight) hours as needed (prn nausea vomiting).  30 tablet  1  . oxyCODONE (ROXICODONE) 5 MG immediate release tablet Take 1-2 tablets (5-10 mg total) by mouth every 4 (four) hours as needed for pain.  120 tablet  0  . prochlorperazine (COMPAZINE) 10 MG tablet Take 1 tablet (10 mg total) by mouth every 6 (six) hours as needed (nausea and vomiting).  30 tablet  1  . sucralfate (CARAFATE) 1 GM/10ML suspension Take 10 mLs (1 g total) by mouth 4 (four) times  daily.  420 mL  0  . traMADol (ULTRAM) 50 MG tablet Take 1 tablet (50 mg total) by mouth every 6 (six) hours as needed for pain.  30 tablet  1   No current facility-administered medications for this encounter.   Labs:  Lab Results  Component Value Date   WBC 4.2 09/02/2012   HGB 11.7* 09/02/2012   HCT 34.1* 09/02/2012   MCV 85.0 09/02/2012   PLT 89 verified* 09/02/2012   Lab Results  Component Value Date   CREATININE 0.8 09/02/2012   BUN 9.7 09/02/2012   NA 140 09/02/2012   K 3.2* 09/02/2012   CL 103 09/02/2012   CO2 26 09/02/2012   Lab Results  Component Value Date   ALT 18 09/02/2012   AST 12 09/02/2012   BILITOT <0.20 Repeated and Verified 09/02/2012    Physical Examination:  weight is 134 lb 4.8 oz (60.918 kg). His oral temperature is 98.3 F (36.8 C). His blood pressure is 122/76 and his pulse is 95. His respiration is 20 and oxygen saturation is 100%.    Wt Readings from Last 3 Encounters:  09/03/12 134 lb 4.8 oz (60.918 kg)  08/27/12 135 lb 4.8 oz (61.372 kg)  08/26/12 132 lb 8 oz  (60.102 kg)    The oral cavity is moist without secondary infection. The skin of the treatment area shows hyperpigmentation changes and some dry desquamation. Lungs - Normal respiratory effort, chest expands symmetrically. Lungs are clear to auscultation, no crackles or wheezes.  Heart has regular rhythm and rate  Abdomen is soft and non tender with normal bowel sounds  Assessment:  Patient tolerating treatments well except for issues as above. Patient will bring his medications in tomorrow for review and refills.  Plan: Continue treatment per original radiation prescription

## 2012-09-03 NOTE — Telephone Encounter (Signed)
Message copied by Charma Igo on Tue Sep 03, 2012  3:32 PM ------      Message from: Si Gaul      Created: Mon Sep 02, 2012 10:15 PM       Call patient with the result and order K Dur 20 meq po qd X 7 days. ------

## 2012-09-04 ENCOUNTER — Ambulatory Visit: Payer: 59

## 2012-09-04 ENCOUNTER — Ambulatory Visit
Admission: RE | Admit: 2012-09-04 | Discharge: 2012-09-04 | Disposition: A | Discharge status: Home / Self Care | Payer: 59 | Source: Ambulatory Visit | Attending: Radiation Oncology | Admitting: Radiation Oncology

## 2012-09-05 ENCOUNTER — Ambulatory Visit: Payer: 59

## 2012-09-05 ENCOUNTER — Ambulatory Visit
Admission: RE | Admit: 2012-09-05 | Discharge: 2012-09-05 | Disposition: A | Discharge status: Home / Self Care | Payer: 59 | Source: Ambulatory Visit | Attending: Radiation Oncology | Admitting: Radiation Oncology

## 2012-09-06 ENCOUNTER — Ambulatory Visit
Admission: RE | Admit: 2012-09-06 | Discharge: 2012-09-06 | Disposition: A | Discharge status: Home / Self Care | Payer: 59 | Source: Ambulatory Visit | Attending: Radiation Oncology | Admitting: Radiation Oncology

## 2012-09-06 ENCOUNTER — Telehealth: Payer: Self-pay | Admitting: Medical Oncology

## 2012-09-06 ENCOUNTER — Ambulatory Visit: Payer: 59

## 2012-09-06 NOTE — Telephone Encounter (Signed)
Message copied by Charma Igo on Fri Sep 06, 2012  8:54 AM ------      Message from: Si Gaul      Created: Mon Sep 02, 2012 10:15 PM       Call patient with the result and order K Dur 20 meq po qd X 7 days. ------

## 2012-09-09 ENCOUNTER — Ambulatory Visit: Payer: 59

## 2012-09-09 ENCOUNTER — Ambulatory Visit
Admission: RE | Admit: 2012-09-09 | Discharge: 2012-09-09 | Disposition: A | Discharge status: Home / Self Care | Payer: 59 | Source: Ambulatory Visit | Attending: Radiation Oncology | Admitting: Radiation Oncology

## 2012-09-09 VITALS — BP 118/81 | HR 98 | Temp 98.2°F | Resp 20 | Wt 136.6 lb

## 2012-09-09 DIAGNOSIS — C3490 Malignant neoplasm of unspecified part of unspecified bronchus or lung: Secondary | ICD-10-CM

## 2012-09-09 MED ORDER — HYDROMORPHONE HCL 4 MG PO TABS: 4.0000 mg | ORAL_TABLET | ORAL | Status: DC | PRN

## 2012-09-09 MED ORDER — MIRTAZAPINE 30 MG PO TABS: 30.0000 mg | ORAL_TABLET | Freq: Every evening | ORAL | Status: DC | PRN

## 2012-09-09 MED ORDER — SUCRALFATE 1 GM/10ML PO SUSP: 1.0000 g | Freq: Four times a day (QID) | ORAL | Status: DC

## 2012-09-09 MED ORDER — PROCHLORPERAZINE MALEATE 10 MG PO TABS: 10.0000 mg | ORAL_TABLET | Freq: Four times a day (QID) | ORAL | Status: DC | PRN

## 2012-09-09 MED ORDER — HYDROCODONE-ACETAMINOPHEN 7.5-325 MG/15ML PO SOLN: 15.0000 mL | Freq: Four times a day (QID) | ORAL | Status: DC | PRN

## 2012-09-09 NOTE — Progress Notes (Signed)
Pt states he continues to have painful swallowing due to esophagitis and c/o nausea , but these problems are much improved. He is taking Compazine for nausea prn, states Duragesic patch will not stay on skin, so he has not been using. He takes Hycet for throat pain, Dilaudid prn, and Sucralfate. He is requesting multiple refills today, completes radiation treatment tomorrow. Pt's skin in treatment area tanned, no desquamation. Pt has 1 month FU card.

## 2012-09-09 NOTE — Progress Notes (Signed)
[facility]    Radiation Oncology [address] [place]     Maryln Gottron, M.D. [place], [place] [address]               Billie Lade, M.D., Ph.D. Phone: [phone]      Molli Hazard A. Kathrynn Running, M.D. Fax: [phone]      Radene Gunning, M.D., Ph.D.         Lurline Hare, M.D.         Grayland Jack, M.D Weekly Treatment Management Note  Name: Terrance Clark     MRN: [id number]        CSN: [id number] Date: 09/09/2012      DOB: September 21, 1975  CC: Default, Provider, MD         Donata Clay    Status: Outpatient  Diagnosis: The encounter diagnosis was Non-small cell carcinoma of lung, unspecified laterality.  Current Dose: 61.2 Gy  Current Fraction: 34  Planned Dose: 63 Gy  Narrative: [name] was seen today for weekly treatment management. The chart was checked and CBCT  were reviewed. He continues to have a lot of pain with swallowing but over the past few days this has improved slightly. he does have a lot of fatigue but is continuing to work.  The Duragesic patches would not stay on well and the patient stopped using these. I did refill his Dilaudid and hydrocodone elixir.  Review of patient's allergies indicates no known allergies. Current Outpatient Prescriptions  Medication Sig Dispense Refill  . dexamethasone (DECADRON) 4 MG tablet Take 4 mg by mouth 2 (two) times daily. He takes the day before, day of and day after receiving chemo treatments.      . fentaNYL (DURAGESIC - DOSED MCG/HR) 25 MCG/HR Place 1 patch (25 mcg total) onto the skin every 3 (three) days.  5 patch  0  . fluconazole (DIFLUCAN) 100 MG tablet Take 1 tablet (100 mg total) by mouth daily.  8 tablet  0  . folic acid (FOLVITE) 1 MG tablet Take 1 mg by mouth every morning.      Marland Kitchen HYDROcodone-acetaminophen (HYCET) 7.5-325 mg/15 ml solution Take 15 mLs by mouth 4 (four) times daily as needed for pain.  473 mL  0  . HYDROmorphone (DILAUDID) 4 MG tablet Take 1 tablet (4 mg total) by mouth every 4 (four)  hours as needed for pain.  60 tablet  0  . lidocaine (XYLOCAINE) 2 % solution Take 15 mLs by mouth 3 (three) times daily before meals.  100 mL  0  . LORazepam (ATIVAN) 0.5 MG tablet Place 1 tablet (0.5 mg total) under the tongue every 8 (eight) hours.  30 tablet  0  . mirtazapine (REMERON) 30 MG tablet Take 1 tablet (30 mg total) by mouth at bedtime as needed (For sleep.).  30 tablet  0  . ondansetron (ZOFRAN) 8 MG tablet Take 1 tablet (8 mg total) by mouth every 8 (eight) hours as needed (prn nausea vomiting).  30 tablet  1  . oxyCODONE (ROXICODONE) 5 MG immediate release tablet Take 1-2 tablets (5-10 mg total) by mouth every 4 (four) hours as needed for pain.  120 tablet  0  . potassium chloride SA (K-DUR,KLOR-CON) 20 MEQ tablet Take 1 tablet (20 mEq total) by mouth daily.  7 tablet  0  . prochlorperazine (COMPAZINE) 10 MG tablet Take 1 tablet (10 mg total) by mouth every 6 (six) hours as needed (nausea and vomiting).  30 tablet  1  .  sucralfate (CARAFATE) 1 GM/10ML suspension Take 10 mLs (1 g total) by mouth 4 (four) times daily.  420 mL  0  . traMADol (ULTRAM) 50 MG tablet Take 1 tablet (50 mg total) by mouth every 6 (six) hours as needed for pain.  30 tablet  1   No current facility-administered medications for this encounter.   Labs:  Lab Results  Component Value Date   WBC 4.2 09/02/2012   HGB 11.7* 09/02/2012   HCT 34.1* 09/02/2012   MCV 85.0 09/02/2012   PLT 89 verified* 09/02/2012   Lab Results  Component Value Date   CREATININE 0.8 09/02/2012   BUN 9.7 09/02/2012   NA 140 09/02/2012   K 3.2* 09/02/2012   CL 103 09/02/2012   CO2 26 09/02/2012   Lab Results  Component Value Date   ALT 18 09/02/2012   AST 12 09/02/2012   BILITOT <0.20 Repeated and Verified 09/02/2012    Physical Examination:  weight is 136 lb 9.6 oz (61.961 kg). His temperature is 98.2 F (36.8 C). His blood pressure is 118/81 and his pulse is 98. His respiration is 20.    Wt Readings from Last 3 Encounters:   09/09/12 136 lb 9.6 oz (61.961 kg)  09/03/12 134 lb 4.8 oz (60.918 kg)  08/27/12 135 lb 4.8 oz (61.372 kg)    The oral cavity is moist. Patient does have hyperpigmentation changes in the treatment area but no skin breakdown is appreciated. Lungs - Normal respiratory effort, chest expands symmetrically. Lungs are clear to auscultation, no crackles or wheezes.  Heart has regular rhythm and rate  Abdomen is soft and non tender with normal bowel sounds  Assessment:  Patient is having side effects as above.  Plan: Continue treatment per original radiation prescription with last treatment tomorrow.

## 2012-09-10 ENCOUNTER — Ambulatory Visit
Admission: RE | Admit: 2012-09-10 | Discharge: 2012-09-10 | Disposition: A | Discharge status: Home / Self Care | Payer: 59 | Source: Ambulatory Visit | Attending: Radiation Oncology | Admitting: Radiation Oncology

## 2012-09-18 ENCOUNTER — Encounter: Payer: Self-pay | Admitting: Radiation Oncology

## 2012-09-18 NOTE — Progress Notes (Signed)
  Radiation Oncology         [phone] ________________________________  Name: Terrance Clark MRN: [id number]  Date: 09/18/2012  DOB: 10/11/1975  End of Treatment Note  Diagnosis:   Stage IV non-small cell lung     Indication for treatment:  Definitive treatment along with radiosensitizing chemotherapy and stereotactic radiosurgery to the brain       Radiation treatment dates:   Jul 16 2012 through 09/10/2012, stereotactic radiosurgery for the brain metastasis was performed on 07/18/2012  Site/dose:   Lower neck and upper chest region, 63 gray in 35 fractions  Beams/energy:   Intensity modulated radiation therapy was used to deliver the patient's lower neck and chest treatment in light of the close proximity of the spinal cord to the target area.  Narrative: The patient tolerated radiation treatment well initially however towards the end of his therapy he developed significant nausea, esophagitis and associated pain. He did require significant narcotics during the course of his therapy. He however continued to work throughout much his treatment. He did require a break in his therapy at one point in light of his symptoms.  Plan: The patient has completed radiation treatment. The patient will return to radiation oncology clinic for routine followup in one month. I advised them to call or return sooner if they have any questions or concerns related to their recovery or treatment.  -----------------------------------  Billie Lade, PhD, MD

## 2012-09-20 ENCOUNTER — Ambulatory Visit (HOSPITAL_COMMUNITY)
Admission: RE | Admit: 2012-09-20 | Discharge: 2012-09-20 | Disposition: A | Discharge status: Home / Self Care | Payer: 59 | Source: Ambulatory Visit | Attending: Internal Medicine | Admitting: Internal Medicine

## 2012-09-20 DIAGNOSIS — Z923 Personal history of irradiation: Secondary | ICD-10-CM | POA: Insufficient documentation

## 2012-09-20 DIAGNOSIS — C778 Secondary and unspecified malignant neoplasm of lymph nodes of multiple regions: Secondary | ICD-10-CM | POA: Insufficient documentation

## 2012-09-20 DIAGNOSIS — C3492 Malignant neoplasm of unspecified part of left bronchus or lung: Secondary | ICD-10-CM

## 2012-09-20 DIAGNOSIS — C3490 Malignant neoplasm of unspecified part of unspecified bronchus or lung: Secondary | ICD-10-CM | POA: Insufficient documentation

## 2012-09-20 DIAGNOSIS — Z9221 Personal history of antineoplastic chemotherapy: Secondary | ICD-10-CM | POA: Insufficient documentation

## 2012-09-20 MED ORDER — IOHEXOL 300 MG/ML  SOLN
100.0000 mL | Freq: Once | INTRAMUSCULAR | Status: AC | PRN
  Administered 2012-09-20: 100 mL via INTRAVENOUS

## 2012-09-20 MED ORDER — IOHEXOL 300 MG/ML  SOLN
50.0000 mL | Freq: Once | INTRAMUSCULAR | Status: AC | PRN
  Administered 2012-09-20: 50 mL via ORAL

## 2012-09-23 ENCOUNTER — Other Ambulatory Visit (HOSPITAL_BASED_OUTPATIENT_CLINIC_OR_DEPARTMENT_OTHER): Payer: 59

## 2012-09-23 ENCOUNTER — Encounter: Payer: 59 | Admitting: Nutrition

## 2012-09-23 ENCOUNTER — Ambulatory Visit (HOSPITAL_BASED_OUTPATIENT_CLINIC_OR_DEPARTMENT_OTHER): Payer: 59 | Admitting: Internal Medicine

## 2012-09-23 ENCOUNTER — Encounter: Payer: Self-pay | Admitting: Internal Medicine

## 2012-09-23 ENCOUNTER — Ambulatory Visit (HOSPITAL_BASED_OUTPATIENT_CLINIC_OR_DEPARTMENT_OTHER): Payer: 59

## 2012-09-23 VITALS — BP 122/83 | HR 94 | Temp 98.2°F | Resp 18 | Ht 71.0 in | Wt 135.5 lb

## 2012-09-23 DIAGNOSIS — C3492 Malignant neoplasm of unspecified part of left bronchus or lung: Secondary | ICD-10-CM

## 2012-09-23 DIAGNOSIS — C3410 Malignant neoplasm of upper lobe, unspecified bronchus or lung: Secondary | ICD-10-CM

## 2012-09-23 DIAGNOSIS — C778 Secondary and unspecified malignant neoplasm of lymph nodes of multiple regions: Secondary | ICD-10-CM

## 2012-09-23 DIAGNOSIS — Z5111 Encounter for antineoplastic chemotherapy: Secondary | ICD-10-CM

## 2012-09-23 DIAGNOSIS — C3491 Malignant neoplasm of unspecified part of right bronchus or lung: Secondary | ICD-10-CM

## 2012-09-23 DIAGNOSIS — E876 Hypokalemia: Secondary | ICD-10-CM

## 2012-09-23 LAB — COMPREHENSIVE METABOLIC PANEL (CC13)
AST: 10 U/L (ref 5–34)
Albumin: 3.5 g/dL (ref 3.5–5.0)
Alkaline Phosphatase: 107 U/L (ref 40–150)
BUN: 8 mg/dL (ref 7.0–26.0)
Potassium: 3 mEq/L — CL (ref 3.5–5.1)
Sodium: 140 mEq/L (ref 136–145)
Total Bilirubin: 0.27 mg/dL (ref 0.20–1.20)
Total Protein: 6.5 g/dL (ref 6.4–8.3)

## 2012-09-23 LAB — CBC WITH DIFFERENTIAL/PLATELET
EOS%: 1 % (ref 0.0–7.0)
LYMPH%: 22.4 % (ref 14.0–49.0)
MCH: 29.6 pg (ref 27.2–33.4)
MCV: 88.2 fL (ref 79.3–98.0)
MONO%: 15.2 % — ABNORMAL HIGH (ref 0.0–14.0)
Platelets: 253 10*3/uL (ref 140–400)
RBC: 4.32 10*6/uL (ref 4.20–5.82)
RDW: 16.2 % — ABNORMAL HIGH (ref 11.0–14.6)

## 2012-09-23 MED ORDER — SODIUM CHLORIDE 0.9 % IV SOLN
643.5000 mg | Freq: Once | INTRAVENOUS | Status: AC
  Administered 2012-09-23: 640 mg via INTRAVENOUS
  Filled 2012-09-23: qty 64

## 2012-09-23 MED ORDER — SODIUM CHLORIDE 0.9 % IV SOLN
Freq: Once | INTRAVENOUS | Status: AC
  Administered 2012-09-23: 10:00:00 via INTRAVENOUS

## 2012-09-23 MED ORDER — POTASSIUM CHLORIDE CRYS ER 20 MEQ PO TBCR: 20.0000 meq | EXTENDED_RELEASE_TABLET | Freq: Every day | ORAL | Status: DC

## 2012-09-23 MED ORDER — CYANOCOBALAMIN 1000 MCG/ML IJ SOLN
1000.0000 ug | Freq: Once | INTRAMUSCULAR | Status: AC
Start: 2012-09-23 — End: 2012-09-23
  Administered 2012-09-23: 1000 ug via INTRAMUSCULAR

## 2012-09-23 MED ORDER — SODIUM CHLORIDE 0.9 % IV SOLN
500.0000 mg/m2 | Freq: Once | INTRAVENOUS | Status: AC
  Administered 2012-09-23: 900 mg via INTRAVENOUS
  Filled 2012-09-23: qty 36

## 2012-09-23 MED ORDER — ONDANSETRON 16 MG/50ML IVPB (CHCC)
16.0000 mg | Freq: Once | INTRAVENOUS | Status: AC
  Administered 2012-09-23: 16 mg via INTRAVENOUS

## 2012-09-23 MED ORDER — DEXAMETHASONE SODIUM PHOSPHATE 20 MG/5ML IJ SOLN
20.0000 mg | Freq: Once | INTRAMUSCULAR | Status: AC
  Administered 2012-09-23: 20 mg via INTRAVENOUS

## 2012-09-23 NOTE — Progress Notes (Signed)
[facility] [phone]   [phone]  OFFICE PROGRESS NOTE  Default, Provider, MD [address] [place] [address]  DIAGNOSIS: Metastatic non-small cell lung cancer, adenocarcinoma with unknown status of the EGFR mutation or ALK gene translocation secondary to insufficient material, diagnosed in April 2014.   PRIOR THERAPY:  1) stereotactic radiotherapy under the care of Dr. Kathrynn Running completed on 07/18/2012.   CURRENT THERAPY:  1) Systemic chemotherapy with carboplatin for AUC of 5 and Alimta 500 mg/M2 giving every 3 weeks, status post 1 cycle. First dose was given on 07/15/2012.  2) palliative radiotherapy to the left upper lobe and mediastinal lymph nodes.   INTERVAL HISTORY: Terrance Clark 37 y.o. male returns to the clinic today for followup visit. The patient is feeling fine today with no specific complaints except for fatigue and sometimes dizzy spells especially when he change positions. The patient denied having any significant chest pain, shortness breath, cough or hemoptysis. He completed a course of palliative radiotherapy to the left upper lobe and mediastinal lymph nodes under the care of Dr. Kathrynn Running. He had repeat CT scan of the chest, abdomen and pelvis performed recently and he is here for evaluation and discussion of his scan results.   MEDICAL HISTORY: Past Medical History  Diagnosis Date  . H/O oral surgery 04/2012  . Cough   . Shortness of breath     with exertion  . Insomnia     d/t pain and takes Goody's PM    ALLERGIES:  has No Known Allergies.  MEDICATIONS:  Current Outpatient Prescriptions  Medication Sig Dispense Refill  . dexamethasone (DECADRON) 4 MG tablet Take 4 mg by mouth 2 (two) times daily. He takes the day before, day of and day after receiving chemo treatments.      . fentaNYL (DURAGESIC - DOSED MCG/HR) 25 MCG/HR Place 1 patch (25 mcg total) onto the skin every 3 (three) days.  5 patch  0  . fluconazole  (DIFLUCAN) 100 MG tablet Take 1 tablet (100 mg total) by mouth daily.  8 tablet  0  . folic acid (FOLVITE) 1 MG tablet Take 1 mg by mouth every morning.      Marland Kitchen HYDROcodone-acetaminophen (HYCET) 7.5-325 mg/15 ml solution Take 15 mLs by mouth 4 (four) times daily as needed for pain.  473 mL  0  . HYDROmorphone (DILAUDID) 4 MG tablet Take 1 tablet (4 mg total) by mouth every 4 (four) hours as needed for pain.  60 tablet  0  . lidocaine (XYLOCAINE) 2 % solution Take 15 mLs by mouth 3 (three) times daily before meals.  100 mL  0  . LORazepam (ATIVAN) 0.5 MG tablet Place 1 tablet (0.5 mg total) under the tongue every 8 (eight) hours.  30 tablet  0  . mirtazapine (REMERON) 30 MG tablet Take 1 tablet (30 mg total) by mouth at bedtime as needed (For sleep.).  30 tablet  0  . ondansetron (ZOFRAN) 8 MG tablet Take 1 tablet (8 mg total) by mouth every 8 (eight) hours as needed (prn nausea vomiting).  30 tablet  1  . oxyCODONE (ROXICODONE) 5 MG immediate release tablet Take 1-2 tablets (5-10 mg total) by mouth every 4 (four) hours as needed for pain.  120 tablet  0  . potassium chloride SA (K-DUR,KLOR-CON) 20 MEQ tablet Take 1 tablet (20 mEq total) by mouth daily.  7 tablet  0  . prochlorperazine (COMPAZINE) 10 MG tablet Take  1 tablet (10 mg total) by mouth every 6 (six) hours as needed (nausea and vomiting).  30 tablet  1  . sucralfate (CARAFATE) 1 GM/10ML suspension Take 10 mLs (1 g total) by mouth 4 (four) times daily.  420 mL  0  . traMADol (ULTRAM) 50 MG tablet Take 1 tablet (50 mg total) by mouth every 6 (six) hours as needed for pain.  30 tablet  1   No current facility-administered medications for this visit.    SURGICAL HISTORY:  Past Surgical History  Procedure Laterality Date  . Foot surgery    . Mouth surgery    . Video bronchoscopy with endobronchial ultrasound Right 06/19/2012    Procedure: VIDEO BRONCHOSCOPY WITH ENDOBRONCHIAL ULTRASOUND;  Surgeon: Leslye Peer, MD;  Location: [facility] OR;   Service: Pulmonary;  Laterality: Right;    REVIEW OF SYSTEMS:  A comprehensive review of systems was negative except for: Constitutional: positive for fatigue   PHYSICAL EXAMINATION: General appearance: alert, cooperative, fatigued and no distress Head: Normocephalic, without obvious abnormality, atraumatic Neck: no adenopathy Lymph nodes: Cervical, supraclavicular, and axillary nodes normal. Resp: clear to auscultation bilaterally Cardio: regular rate and rhythm, S1, S2 normal, no murmur, click, rub or gallop GI: soft, non-tender; bowel sounds normal; no masses,  no organomegaly Extremities: extremities normal, atraumatic, no cyanosis or edema Neurologic: Alert and oriented X 3, normal strength and tone. Normal symmetric reflexes. Normal coordination and gait  ECOG PERFORMANCE STATUS: 1 - Symptomatic but completely ambulatory  Blood pressure 122/83, pulse 94, temperature 98.2 F (36.8 C), temperature source Oral, resp. rate 18, height 5\' 11"  (1.803 m), weight 135 lb 8 oz (61.462 kg).  LABORATORY DATA: Lab Results  Component Value Date   WBC 4.5 09/23/2012   HGB 12.8* 09/23/2012   HCT 38.1* 09/23/2012   MCV 88.2 09/23/2012   PLT 253 09/23/2012      Chemistry      Component Value Date/Time   NA 140 09/02/2012 0955   NA 141 06/19/2012 0800   K 3.2* 09/02/2012 0955   K 3.9 06/19/2012 0800   CL 103 09/02/2012 0955   CL 105 06/19/2012 0800   CO2 26 09/02/2012 0955   CO2 25 06/19/2012 0800   BUN 9.7 09/02/2012 0955   BUN 18 06/19/2012 0800   CREATININE 0.8 09/02/2012 0955   CREATININE 0.86 06/19/2012 0800      Component Value Date/Time   CALCIUM 9.0 09/02/2012 0955   CALCIUM 9.5 06/19/2012 0800   ALKPHOS 100 09/02/2012 0955   AST 12 09/02/2012 0955   ALT 18 09/02/2012 0955   BILITOT <0.20 Repeated and Verified 09/02/2012 0955       RADIOGRAPHIC STUDIES: Ct Chest W Contrast  09/20/2012   *RADIOLOGY REPORT*  Clinical Data:  Metastatic lung cancer diagnosed 04/2012, XRT and chemotherapy complete   CT CHEST, ABDOMEN AND PELVIS WITH CONTRAST  Technique:  Multidetector CT imaging of the chest, abdomen and pelvis was performed following the standard protocol during bolus administration of intravenous contrast.  Contrast: OMNIPAQUE IOHEXOL 300 MG/ML  SOLN  Comparison:  PET CT dated 07/10/2012.   CT CHEST  Findings:  11 x 11 mm spiculated nodule in the posterior right lung apex (series 4/image 13), mildly decreased.  Mild patchy opacities in the posterior right upper lobe and superior segment left lower lobe (series 4/image 27), new, possibly infectious/inflammatory.  Underlying moderate centrilobular and paraseptal emphysematous changes.  Mild scarring at the left lung base.  No pleural effusion or pneumothorax.  Visualized thyroid is unremarkable.  The heart is normal in size.  No pericardial effusion.  Small thoracic lymph nodes, including: --7 mm short-axis left supraclavicular node (series 2/image 16), previously 13 mm --9 mm short-axis right supraclavicular node (series 2/image 8), previously 14 mm --Right paratracheal nodal tissue measuring approximately 13 mm short axis (series 2/image 22), previously 15 mm  Visualized osseous structures are within normal limits.  IMPRESSION: 11 mm spiculated posterior right upper lobe pulmonary nodule, corresponding to known primary bronchogenic neoplasm, mildly decreased.  Right paratracheal and bilateral supraclavicular nodal metastases, mildly decreased.  Mild patchy opacities in the posterior right upper lobe and superior segment left lower lobe, new, possibly infectious/inflammatory.  Attention on follow-up is suggested.   CT ABDOMEN AND PELVIS  Findings:  Liver, spleen, pancreas, and adrenal glands within normal limits.  Gallbladder is underdistended.  No intrahepatic or extrahepatic ductal dilatation.  Kidneys are within normal limits.  No hydronephrosis.  No evidence of bowel obstruction.  No evidence of abdominal aortic aneurysm.  No abdominopelvic  ascites.  No suspicious abdominopelvic lymphadenopathy.  Prostate is unremarkable.  Bladder is underdistended.  Visualized osseous structures are within normal limits.  IMPRESSION: No evidence of metastatic disease in the abdomen/pelvis.   Original Report Authenticated By: Charline Bills, M.D.    ASSESSMENT AND PLAN: This is a very pleasant 37 years old African American male with metastatic non-small cell lung cancer, adenocarcinoma currently undergoing systemic chemotherapy with carboplatin and Alimta status post 3 cycles and tolerating his treatment fairly well. The patient has partial improvement in his disease on the recent CT scan of the chest, abdomen and pelvis. I recommended for the patient to continue systemic chemotherapy with the same regimen for now. I would see him back for followup visit in 3 weeks with the next cycle of her systemic therapy she he was advised to call immediately if he has any concerning symptoms in the interval. Patient was advised to take his folic acid and Decadron premedication as prescribed.  All questions were answered. The patient knows to call the clinic with any problems, questions or concerns. We can certainly see the patient much sooner if necessary.  I spent 15 minutes counseling the patient face to face. The total time spent in the appointment was 25 minutes.

## 2012-09-23 NOTE — Addendum Note (Signed)
Addended by: Caren Griffins on: 09/23/2012 09:41 AM   Modules accepted: Orders

## 2012-09-23 NOTE — Patient Instructions (Signed)
Continue chemotherapy today as scheduled. Follow up visit in 3 weeks 

## 2012-09-23 NOTE — Patient Instructions (Addendum)
[facility] Discharge Instructions for Patients Receiving Chemotherapy  Today you received the following chemotherapy agents Alimta/Carboplatin To help prevent nausea and vomiting after your treatment, we encourage you to take your nausea medication as prescribed.  If you develop nausea and vomiting that is not controlled by your nausea medication, call the clinic.   BELOW ARE SYMPTOMS THAT SHOULD BE REPORTED IMMEDIATELY:  *FEVER GREATER THAN 100.5 F  *CHILLS WITH OR WITHOUT FEVER  NAUSEA AND VOMITING THAT IS NOT CONTROLLED WITH YOUR NAUSEA MEDICATION  *UNUSUAL SHORTNESS OF BREATH  *UNUSUAL BRUISING OR BLEEDING  TENDERNESS IN MOUTH AND THROAT WITH OR WITHOUT PRESENCE OF ULCERS  *URINARY PROBLEMS  *BOWEL PROBLEMS  UNUSUAL RASH Items with * indicate a potential emergency and should be followed up as soon as possible.  Feel free to call the clinic you have any questions or concerns. The clinic phone number is [phone].    

## 2012-09-24 ENCOUNTER — Telehealth: Payer: Self-pay | Admitting: *Deleted

## 2012-09-24 ENCOUNTER — Other Ambulatory Visit: Payer: Self-pay | Admitting: Internal Medicine

## 2012-09-24 ENCOUNTER — Telehealth: Payer: Self-pay | Admitting: Internal Medicine

## 2012-09-24 DIAGNOSIS — C3490 Malignant neoplasm of unspecified part of unspecified bronchus or lung: Secondary | ICD-10-CM

## 2012-09-24 NOTE — Telephone Encounter (Signed)
lvm for pt with advising on upcomming appts...mailed pt avs and letter

## 2012-09-24 NOTE — Telephone Encounter (Signed)
Per staff message and POF I have scheduled appts.  JMW  

## 2012-09-30 ENCOUNTER — Other Ambulatory Visit: Payer: 59

## 2012-10-07 ENCOUNTER — Other Ambulatory Visit (HOSPITAL_BASED_OUTPATIENT_CLINIC_OR_DEPARTMENT_OTHER): Payer: 59 | Admitting: Lab

## 2012-10-07 ENCOUNTER — Encounter: Payer: Self-pay | Admitting: Radiation Oncology

## 2012-10-07 DIAGNOSIS — C3490 Malignant neoplasm of unspecified part of unspecified bronchus or lung: Secondary | ICD-10-CM | POA: Insufficient documentation

## 2012-10-07 DIAGNOSIS — C3492 Malignant neoplasm of unspecified part of left bronchus or lung: Secondary | ICD-10-CM

## 2012-10-07 DIAGNOSIS — C3410 Malignant neoplasm of upper lobe, unspecified bronchus or lung: Secondary | ICD-10-CM

## 2012-10-07 DIAGNOSIS — C7931 Secondary malignant neoplasm of brain: Secondary | ICD-10-CM | POA: Insufficient documentation

## 2012-10-07 DIAGNOSIS — Z923 Personal history of irradiation: Secondary | ICD-10-CM | POA: Insufficient documentation

## 2012-10-07 LAB — COMPREHENSIVE METABOLIC PANEL (CC13)
AST: 11 U/L (ref 5–34)
Albumin: 3.5 g/dL (ref 3.5–5.0)
Alkaline Phosphatase: 103 U/L (ref 40–150)
BUN: 8.9 mg/dL (ref 7.0–26.0)
Creatinine: 0.7 mg/dL (ref 0.7–1.3)
Glucose: 93 mg/dl (ref 70–140)
Potassium: 3.9 mEq/L (ref 3.5–5.1)
Total Bilirubin: 0.34 mg/dL (ref 0.20–1.20)

## 2012-10-07 LAB — CBC WITH DIFFERENTIAL/PLATELET
Basophils Absolute: 0 10*3/uL (ref 0.0–0.1)
EOS%: 0.7 % (ref 0.0–7.0)
Eosinophils Absolute: 0 10*3/uL (ref 0.0–0.5)
HGB: 12.1 g/dL — ABNORMAL LOW (ref 13.0–17.1)
LYMPH%: 17.1 % (ref 14.0–49.0)
MCH: 29.8 pg (ref 27.2–33.4)
MCV: 89.3 fL (ref 79.3–98.0)
MONO%: 16.5 % — ABNORMAL HIGH (ref 0.0–14.0)
NEUT#: 2.8 10*3/uL (ref 1.5–6.5)
Platelets: 127 10*3/uL — ABNORMAL LOW (ref 140–400)
RDW: 17.5 % — ABNORMAL HIGH (ref 11.0–14.6)

## 2012-10-10 ENCOUNTER — Ambulatory Visit
Admission: RE | Admit: 2012-10-10 | Discharge: 2012-10-10 | Disposition: A | Discharge status: Home / Self Care | Payer: 59 | Source: Ambulatory Visit | Attending: Radiation Oncology | Admitting: Radiation Oncology

## 2012-10-10 ENCOUNTER — Encounter: Payer: Self-pay | Admitting: Radiation Oncology

## 2012-10-10 VITALS — BP 124/76 | HR 92 | Temp 98.2°F | Resp 18 | Wt 137.4 lb

## 2012-10-10 DIAGNOSIS — C3491 Malignant neoplasm of unspecified part of right bronchus or lung: Secondary | ICD-10-CM

## 2012-10-10 NOTE — Progress Notes (Signed)
Radiation Oncology         [phone] ________________________________  Name: Terrance Clark MRN: [id number]  Date: 10/10/2012  DOB: 07-05-1975  Follow-Up Visit Note  CC: Default, Provider, MD  Donata Clay, Theron Arista, MD  Diagnosis:   Stage IV non-small cell lung cancer  Interval Since Last Radiation:  30  days  Narrative:  The patient returns today for routine follow-up.  He completed radiation treatments to the lower neck and upper chest region as well as the brain.  He had significant esophageal symptoms during his radiation therapy requiring  narcotics but is completely off all these medications at this time. His appetite is good. He denies any odynophagia or dysphagia.  He denies any breathing problems. He has a mild nonproductive cough. Patient continues to work full-time. he has some mild fatigue.                              ALLERGIES:  has No Known Allergies.  Meds: Current Outpatient Prescriptions  Medication Sig Dispense Refill  . folic acid (FOLVITE) 1 MG tablet Take 1 mg by mouth every morning.      Marland Kitchen dexamethasone (DECADRON) 4 MG tablet TAKE ONE TABLET BY MOUTH AS DIRECTED TAKE ONE  TABLET TWICE DAILY THE DAY BEFORE, DAY OF, AND DAY  AFTER CHEMO  40 tablet  1  . fentaNYL (DURAGESIC - DOSED MCG/HR) 25 MCG/HR Place 1 patch (25 mcg total) onto the skin every 3 (three) days.  5 patch  0  . fluconazole (DIFLUCAN) 100 MG tablet Take 1 tablet (100 mg total) by mouth daily.  8 tablet  0  . HYDROcodone-acetaminophen (HYCET) 7.5-325 mg/15 ml solution Take 15 mLs by mouth 4 (four) times daily as needed for pain.  473 mL  0  . HYDROmorphone (DILAUDID) 4 MG tablet Take 1 tablet (4 mg total) by mouth every 4 (four) hours as needed for pain.  60 tablet  0  . lidocaine (XYLOCAINE) 2 % solution Take 15 mLs by mouth 3 (three) times daily before meals.  100 mL  0  . LORazepam (ATIVAN) 0.5 MG tablet Place 1 tablet (0.5 mg total) under the tongue every 8 (eight) hours.  30 tablet  0  . mirtazapine  (REMERON) 30 MG tablet Take 1 tablet (30 mg total) by mouth at bedtime as needed (For sleep.).  30 tablet  0  . ondansetron (ZOFRAN) 8 MG tablet Take 1 tablet (8 mg total) by mouth every 8 (eight) hours as needed (prn nausea vomiting).  30 tablet  1  . oxyCODONE (ROXICODONE) 5 MG immediate release tablet Take 1-2 tablets (5-10 mg total) by mouth every 4 (four) hours as needed for pain.  120 tablet  0  . potassium chloride SA (K-DUR,KLOR-CON) 20 MEQ tablet Take 1 tablet (20 mEq total) by mouth daily.  7 tablet  0  . prochlorperazine (COMPAZINE) 10 MG tablet Take 1 tablet (10 mg total) by mouth every 6 (six) hours as needed (nausea and vomiting).  30 tablet  1  . sucralfate (CARAFATE) 1 GM/10ML suspension Take 10 mLs (1 g total) by mouth 4 (four) times daily.  420 mL  0  . traMADol (ULTRAM) 50 MG tablet Take 1 tablet (50 mg total) by mouth every 6 (six) hours as needed for pain.  30 tablet  1   No current facility-administered medications for this encounter.    Physical Findings: The patient is in no acute  distress. Patient is alert and oriented.  weight is 137 lb 6.4 oz (62.324 kg). His oral temperature is 98.2 F (36.8 C). His blood pressure is 124/76 and his pulse is 92. His respiration is 18 and oxygen saturation is 100%. .  No palpable supraclavicular or axillary adenopathy. The lungs are clear to auscultation. The heart has a regular rhythm and rate. Patient has hyperpigmentation in the radiation portals.  Lab Findings: Lab Results  Component Value Date   WBC 4.3 10/07/2012   HGB 12.1* 10/07/2012   HCT 36.3* 10/07/2012   MCV 89.3 10/07/2012   PLT 127* 10/07/2012      Radiographic Findings: Ct Chest W Contrast  09/20/2012   *RADIOLOGY REPORT*  Clinical Data:  Metastatic lung cancer diagnosed 04/2012, XRT and chemotherapy complete  CT CHEST, ABDOMEN AND PELVIS WITH CONTRAST  Technique:  Multidetector CT imaging of the chest, abdomen and pelvis was performed following the standard protocol  during bolus administration of intravenous contrast.  Contrast: OMNIPAQUE IOHEXOL 300 MG/ML  SOLN  Comparison:  PET CT dated 07/10/2012.  CT CHEST  Findings:  11 x 11 mm spiculated nodule in the posterior right lung apex (series 4/image 13), mildly decreased.  Mild patchy opacities in the posterior right upper lobe and superior segment left lower lobe (series 4/image 27), new, possibly infectious/inflammatory.  Underlying moderate centrilobular and paraseptal emphysematous changes.  Mild scarring at the left lung base.  No pleural effusion or pneumothorax.  Visualized thyroid is unremarkable.  The heart is normal in size.  No pericardial effusion.  Small thoracic lymph nodes, including: --7 mm short-axis left supraclavicular node (series 2/image 16), previously 13 mm --9 mm short-axis right supraclavicular node (series 2/image 8), previously 14 mm --Right paratracheal nodal tissue measuring approximately 13 mm short axis (series 2/image 22), previously 15 mm  Visualized osseous structures are within normal limits.  IMPRESSION: 11 mm spiculated posterior right upper lobe pulmonary nodule, corresponding to known primary bronchogenic neoplasm, mildly decreased.  Right paratracheal and bilateral supraclavicular nodal metastases, mildly decreased.  Mild patchy opacities in the posterior right upper lobe and superior segment left lower lobe, new, possibly infectious/inflammatory.  Attention on follow-up is suggested.  CT ABDOMEN AND PELVIS  Findings:  Liver, spleen, pancreas, and adrenal glands within normal limits.  Gallbladder is underdistended.  No intrahepatic or extrahepatic ductal dilatation.  Kidneys are within normal limits.  No hydronephrosis.  No evidence of bowel obstruction.  No evidence of abdominal aortic aneurysm.  No abdominopelvic ascites.  No suspicious abdominopelvic lymphadenopathy.  Prostate is unremarkable.  Bladder is underdistended.  Visualized osseous structures are within normal limits.   IMPRESSION: No evidence of metastatic disease in the abdomen/pelvis.   Original Report Authenticated By: Charline Bills, M.D.   Ct Abdomen Pelvis W Contrast  09/20/2012   *RADIOLOGY REPORT*  Clinical Data:  Metastatic lung cancer diagnosed 04/2012, XRT and chemotherapy complete  CT CHEST, ABDOMEN AND PELVIS WITH CONTRAST  Technique:  Multidetector CT imaging of the chest, abdomen and pelvis was performed following the standard protocol during bolus administration of intravenous contrast.  Contrast: OMNIPAQUE IOHEXOL 300 MG/ML  SOLN  Comparison:  PET CT dated 07/10/2012.  CT CHEST  Findings:  11 x 11 mm spiculated nodule in the posterior right lung apex (series 4/image 13), mildly decreased.  Mild patchy opacities in the posterior right upper lobe and superior segment left lower lobe (series 4/image 27), new, possibly infectious/inflammatory.  Underlying moderate centrilobular and paraseptal emphysematous changes.  Mild scarring  at the left lung base.  No pleural effusion or pneumothorax.  Visualized thyroid is unremarkable.  The heart is normal in size.  No pericardial effusion.  Small thoracic lymph nodes, including: --7 mm short-axis left supraclavicular node (series 2/image 16), previously 13 mm --9 mm short-axis right supraclavicular node (series 2/image 8), previously 14 mm --Right paratracheal nodal tissue measuring approximately 13 mm short axis (series 2/image 22), previously 15 mm  Visualized osseous structures are within normal limits.  IMPRESSION: 11 mm spiculated posterior right upper lobe pulmonary nodule, corresponding to known primary bronchogenic neoplasm, mildly decreased.  Right paratracheal and bilateral supraclavicular nodal metastases, mildly decreased.  Mild patchy opacities in the posterior right upper lobe and superior segment left lower lobe, new, possibly infectious/inflammatory.  Attention on follow-up is suggested.  CT ABDOMEN AND PELVIS  Findings:  Liver, spleen, pancreas, and  adrenal glands within normal limits.  Gallbladder is underdistended.  No intrahepatic or extrahepatic ductal dilatation.  Kidneys are within normal limits.  No hydronephrosis.  No evidence of bowel obstruction.  No evidence of abdominal aortic aneurysm.  No abdominopelvic ascites.  No suspicious abdominopelvic lymphadenopathy.  Prostate is unremarkable.  Bladder is underdistended.  Visualized osseous structures are within normal limits.  IMPRESSION: No evidence of metastatic disease in the abdomen/pelvis.   Original Report Authenticated By: Charline Bills, M.D.    Impression:  The patient is recovering from the effects of radiation.  Clinically he has recovered very quickly.  He had a partial response to his radiation and radiosensitizing chemotherapy based on recent scans.  Plan:  I have not scheduled patient for formal followup with myself but will schedule followup with the [facility] team concerning his brain metastasis. The patient is scheduled for MRI the brain August 26.  He will continue systemic chemotherapy.  _____________________________________  -----------------------------------  Billie Lade, PhD, MD

## 2012-10-10 NOTE — Progress Notes (Signed)
Patient reports that he is only taking folic acid daily. Reports a productive cough with green sputum. Denies painful or difficulty swallowing. Denies pain at this time. Reports his energy level is normal and he remain busy with work. States,"i feel great." Denies hemoptysis. Chemo and Adrena scheduled for 10/14/2012. MRI of brain scheduled for 11/05/2012. Denies nausea, vomiting, headache or dizziness.

## 2012-10-11 ENCOUNTER — Other Ambulatory Visit: Payer: Self-pay | Admitting: *Deleted

## 2012-10-11 DIAGNOSIS — C3490 Malignant neoplasm of unspecified part of unspecified bronchus or lung: Secondary | ICD-10-CM

## 2012-10-14 ENCOUNTER — Ambulatory Visit (HOSPITAL_BASED_OUTPATIENT_CLINIC_OR_DEPARTMENT_OTHER): Payer: 59

## 2012-10-14 ENCOUNTER — Ambulatory Visit: Payer: 59 | Admitting: Nutrition

## 2012-10-14 ENCOUNTER — Ambulatory Visit (HOSPITAL_BASED_OUTPATIENT_CLINIC_OR_DEPARTMENT_OTHER): Payer: 59 | Admitting: Physician Assistant

## 2012-10-14 ENCOUNTER — Telehealth: Payer: Self-pay | Admitting: Internal Medicine

## 2012-10-14 ENCOUNTER — Other Ambulatory Visit (HOSPITAL_BASED_OUTPATIENT_CLINIC_OR_DEPARTMENT_OTHER): Payer: 59

## 2012-10-14 ENCOUNTER — Encounter: Payer: Self-pay | Admitting: Physician Assistant

## 2012-10-14 VITALS — BP 121/81 | HR 83 | Temp 97.8°F | Resp 18 | Ht 71.0 in | Wt 138.4 lb

## 2012-10-14 DIAGNOSIS — R5383 Other fatigue: Secondary | ICD-10-CM

## 2012-10-14 DIAGNOSIS — C778 Secondary and unspecified malignant neoplasm of lymph nodes of multiple regions: Secondary | ICD-10-CM

## 2012-10-14 DIAGNOSIS — C3410 Malignant neoplasm of upper lobe, unspecified bronchus or lung: Secondary | ICD-10-CM

## 2012-10-14 DIAGNOSIS — C3490 Malignant neoplasm of unspecified part of unspecified bronchus or lung: Secondary | ICD-10-CM

## 2012-10-14 DIAGNOSIS — R42 Dizziness and giddiness: Secondary | ICD-10-CM

## 2012-10-14 DIAGNOSIS — R110 Nausea: Secondary | ICD-10-CM

## 2012-10-14 DIAGNOSIS — Z5111 Encounter for antineoplastic chemotherapy: Secondary | ICD-10-CM

## 2012-10-14 DIAGNOSIS — C3491 Malignant neoplasm of unspecified part of right bronchus or lung: Secondary | ICD-10-CM

## 2012-10-14 DIAGNOSIS — C3492 Malignant neoplasm of unspecified part of left bronchus or lung: Secondary | ICD-10-CM

## 2012-10-14 LAB — CBC WITH DIFFERENTIAL/PLATELET
Basophils Absolute: 0 10*3/uL (ref 0.0–0.1)
EOS%: 0.6 % (ref 0.0–7.0)
Eosinophils Absolute: 0 10*3/uL (ref 0.0–0.5)
HCT: 37.6 % — ABNORMAL LOW (ref 38.4–49.9)
HGB: 12.3 g/dL — ABNORMAL LOW (ref 13.0–17.1)
MCH: 29.5 pg (ref 27.2–33.4)
MCV: 90.2 fL (ref 79.3–98.0)
MONO%: 15.2 % — ABNORMAL HIGH (ref 0.0–14.0)
NEUT#: 2 10*3/uL (ref 1.5–6.5)
NEUT%: 63.2 % (ref 39.0–75.0)
Platelets: 198 10*3/uL (ref 140–400)
RDW: 17 % — ABNORMAL HIGH (ref 11.0–14.6)

## 2012-10-14 LAB — COMPREHENSIVE METABOLIC PANEL (CC13)
Albumin: 3.7 g/dL (ref 3.5–5.0)
Alkaline Phosphatase: 103 U/L (ref 40–150)
BUN: 11.2 mg/dL (ref 7.0–26.0)
CO2: 21 mEq/L — ABNORMAL LOW (ref 22–29)
Glucose: 77 mg/dl (ref 70–140)
Sodium: 142 mEq/L (ref 136–145)
Total Bilirubin: <0.2 mg/dL (ref 0.20–1.20)
Total Protein: 6.9 g/dL (ref 6.4–8.3)

## 2012-10-14 MED ORDER — SODIUM CHLORIDE 0.9 % IV SOLN
500.0000 mg/m2 | Freq: Once | INTRAVENOUS | Status: AC
  Administered 2012-10-14: 900 mg via INTRAVENOUS
  Filled 2012-10-14: qty 36

## 2012-10-14 MED ORDER — ONDANSETRON 16 MG/50ML IVPB (CHCC)
16.0000 mg | Freq: Once | INTRAVENOUS | Status: AC
  Administered 2012-10-14: 16 mg via INTRAVENOUS

## 2012-10-14 MED ORDER — SODIUM CHLORIDE 0.9 % IV SOLN
750.0000 mg | Freq: Once | INTRAVENOUS | Status: AC
  Administered 2012-10-14: 750 mg via INTRAVENOUS
  Filled 2012-10-14: qty 75

## 2012-10-14 MED ORDER — DEXAMETHASONE SODIUM PHOSPHATE 20 MG/5ML IJ SOLN
20.0000 mg | Freq: Once | INTRAMUSCULAR | Status: AC
  Administered 2012-10-14: 20 mg via INTRAVENOUS

## 2012-10-14 MED ORDER — SODIUM CHLORIDE 0.9 % IV SOLN
Freq: Once | INTRAVENOUS | Status: AC
  Administered 2012-10-14: 11:00:00 via INTRAVENOUS

## 2012-10-14 NOTE — Patient Instructions (Addendum)
[facility] Discharge Instructions for Patients Receiving Chemotherapy  Today you received the following chemotherapy agents:  Alimta, carboplatin  To help prevent nausea and vomiting after your treatment, we encourage you to take your nausea medication.  Take it as often as prescribed.     If you develop nausea and vomiting that is not controlled by your nausea medication, call the clinic. If it is after clinic hours your family physician or the after hours number for the clinic or go to the Emergency Department.   BELOW ARE SYMPTOMS THAT SHOULD BE REPORTED IMMEDIATELY:  *FEVER GREATER THAN 100.5 F  *CHILLS WITH OR WITHOUT FEVER  NAUSEA AND VOMITING THAT IS NOT CONTROLLED WITH YOUR NAUSEA MEDICATION  *UNUSUAL SHORTNESS OF BREATH  *UNUSUAL BRUISING OR BLEEDING  TENDERNESS IN MOUTH AND THROAT WITH OR WITHOUT PRESENCE OF ULCERS  *URINARY PROBLEMS  *BOWEL PROBLEMS  UNUSUAL RASH Items with * indicate a potential emergency and should be followed up as soon as possible.  Feel free to call the clinic you have any questions or concerns. The clinic phone number is [phone].   I have been informed and understand all the instructions given to me. I know to contact the clinic, my physician, or go to the Emergency Department if any problems should occur. I do not have any questions at this time, but understand that I may call the clinic during office hours   should I have any questions or need assistance in obtaining follow up care.    __________________________________________  _____________  __________ Signature of Patient or Authorized Representative            Date                   Time    __________________________________________ Nurse's Signature

## 2012-10-14 NOTE — Progress Notes (Addendum)
[facility] [phone]   [phone]  SHARED VISIT PROGRESS NOTE  Default, Provider, MD [address] [place] [address]  DIAGNOSIS: Metastatic non-small cell lung cancer, adenocarcinoma with unknown status of the EGFR mutation or ALK gene translocation secondary to insufficient material, diagnosed in April 2014.   PRIOR THERAPY:  1) stereotactic radiotherapy under the care of Dr. Kathrynn Running completed on 07/18/2012.  2) palliative radiotherapy to the left upper lobe and mediastinal lymph nodes. Completed 09/10/2012  CURRENT THERAPY:  1) Systemic chemotherapy with carboplatin for AUC of 5 and Alimta 500 mg/M2 giving every 3 weeks.  First dose was given on 07/15/2012. Status post 4 cycles.    INTERVAL HISTORY: Terrance Clark 37 y.o. male returns to the clinic today for followup visit. The patient is feeling fine today with no specific complaints except for continued fatigue and sometimes dizzy spells especially when he change positions. The patient denied having any significant chest pain, shortness breath, cough or hemoptysis. He does experience some nausea that starts about 2 days after chemotherapy and can last for 4 or 5 days. He completed a course of palliative radiotherapy to the left upper lobe and mediastinal lymph nodes under the care of Dr. Kathrynn Running.   MEDICAL HISTORY: Past Medical History  Diagnosis Date  . H/O oral surgery 04/2012  . Cough   . Shortness of breath     with exertion  . Insomnia     d/t pain and takes Goody's PM  . Lung cancer 06/2012    RUL lung  . Hx of radiation therapy 07/16/12- 09/10/12    lower neck and upper chest region , 63 gray in 35 fx  . 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 Hx of radiation therapy 07/18/12    SRS to brain metastasis    ALLERGIES:  has No Known Allergies.  MEDICATIONS:  Current Outpatient Prescriptions  Medication Sig Dispense Refill  . dexamethasone (DECADRON) 4 MG tablet TAKE ONE TABLET BY MOUTH  AS DIRECTED TAKE ONE  TABLET TWICE DAILY THE DAY BEFORE, DAY OF, AND DAY  AFTER CHEMO  40 tablet  1  . folic acid (FOLVITE) 1 MG tablet Take 1 mg by mouth every morning.      Marland Kitchen LORazepam (ATIVAN) 0.5 MG tablet Place 1 tablet (0.5 mg total) under the tongue every 8 (eight) hours.  30 tablet  0  . ondansetron (ZOFRAN) 8 MG tablet Take 1 tablet (8 mg total) by mouth every 8 (eight) hours as needed (prn nausea vomiting).  30 tablet  1  . prochlorperazine (COMPAZINE) 10 MG tablet Take 1 tablet (10 mg total) by mouth every 6 (six) hours as needed (nausea and vomiting).  30 tablet  1   No current facility-administered medications for this visit.    SURGICAL HISTORY:  Past Surgical History  Procedure Laterality Date  . Foot surgery    . Mouth surgery    . Video bronchoscopy with endobronchial ultrasound Right 06/19/2012    Procedure: VIDEO BRONCHOSCOPY WITH ENDOBRONCHIAL ULTRASOUND;  Surgeon: Leslye Peer, MD;  Location: [facility] OR;  Service: Pulmonary;  Laterality: Right;    REVIEW OF SYSTEMS:  A comprehensive review of systems was negative except for: Constitutional: positive for fatigue Gastrointestinal: positive for nausea   PHYSICAL EXAMINATION: General appearance: alert, cooperative, fatigued and no distress Head: Normocephalic, without obvious abnormality, atraumatic Neck: no adenopathy Lymph nodes: Cervical, supraclavicular, and axillary nodes normal. Resp: clear to auscultation bilaterally Cardio: regular  rate and rhythm, S1, S2 normal, no murmur, click, rub or gallop GI: soft, non-tender; bowel sounds normal; no masses,  no organomegaly Extremities: extremities normal, atraumatic, no cyanosis or edema Neurologic: Alert and oriented X 3, normal strength and tone. Normal symmetric reflexes. Normal coordination and gait  ECOG PERFORMANCE STATUS: 1 - Symptomatic but completely ambulatory  Blood pressure 121/81, pulse 83, temperature 97.8 F (36.6 C), temperature source Oral, resp. rate 18,  height 5\' 11"  (1.803 m), weight 138 lb 6.4 oz (62.778 kg).  LABORATORY DATA: Lab Results  Component Value Date   WBC 3.1* 10/14/2012   HGB 12.3* 10/14/2012   HCT 37.6* 10/14/2012   MCV 90.2 10/14/2012   PLT 198 10/14/2012      Chemistry      Component Value Date/Time   NA 142 10/14/2012 0950   NA 141 06/19/2012 0800   K 3.9 10/14/2012 0950   K 3.9 06/19/2012 0800   CL 103 09/02/2012 0955   CL 105 06/19/2012 0800   CO2 21* 10/14/2012 0950   CO2 25 06/19/2012 0800   BUN 11.2 10/14/2012 0950   BUN 18 06/19/2012 0800   CREATININE 0.8 10/14/2012 0950   CREATININE 0.86 06/19/2012 0800      Component Value Date/Time   CALCIUM 9.3 10/14/2012 0950   CALCIUM 9.5 06/19/2012 0800   ALKPHOS 103 10/14/2012 0950   AST 12 10/14/2012 0950   ALT <6 Repeated and Verified 10/14/2012 0950   BILITOT <0.20 Repeated and Verified 10/14/2012 0950       RADIOGRAPHIC STUDIES: Ct Chest W Contrast  09/20/2012   *RADIOLOGY REPORT*  Clinical Data:  Metastatic lung cancer diagnosed 04/2012, XRT and chemotherapy complete  CT CHEST, ABDOMEN AND PELVIS WITH CONTRAST  Technique:  Multidetector CT imaging of the chest, abdomen and pelvis was performed following the standard protocol during bolus administration of intravenous contrast.  Contrast: OMNIPAQUE IOHEXOL 300 MG/ML  SOLN  Comparison:  PET CT dated 07/10/2012.   CT CHEST  Findings:  11 x 11 mm spiculated nodule in the posterior right lung apex (series 4/image 13), mildly decreased.  Mild patchy opacities in the posterior right upper lobe and superior segment left lower lobe (series 4/image 27), new, possibly infectious/inflammatory.  Underlying moderate centrilobular and paraseptal emphysematous changes.  Mild scarring at the left lung base.  No pleural effusion or pneumothorax.  Visualized thyroid is unremarkable.  The heart is normal in size.  No pericardial effusion.  Small thoracic lymph nodes, including: --7 mm short-axis left supraclavicular node (series 2/image 16), previously 13 mm --9  mm short-axis right supraclavicular node (series 2/image 8), previously 14 mm --Right paratracheal nodal tissue measuring approximately 13 mm short axis (series 2/image 22), previously 15 mm  Visualized osseous structures are within normal limits.  IMPRESSION: 11 mm spiculated posterior right upper lobe pulmonary nodule, corresponding to known primary bronchogenic neoplasm, mildly decreased.  Right paratracheal and bilateral supraclavicular nodal metastases, mildly decreased.  Mild patchy opacities in the posterior right upper lobe and superior segment left lower lobe, new, possibly infectious/inflammatory.  Attention on follow-up is suggested.   CT ABDOMEN AND PELVIS  Findings:  Liver, spleen, pancreas, and adrenal glands within normal limits.  Gallbladder is underdistended.  No intrahepatic or extrahepatic ductal dilatation.  Kidneys are within normal limits.  No hydronephrosis.  No evidence of bowel obstruction.  No evidence of abdominal aortic aneurysm.  No abdominopelvic ascites.  No suspicious abdominopelvic lymphadenopathy.  Prostate is unremarkable.  Bladder is underdistended.  Visualized  osseous structures are within normal limits.  IMPRESSION: No evidence of metastatic disease in the abdomen/pelvis.   Original Report Authenticated By: Charline Bills, M.D.    ASSESSMENT AND PLAN: This is a very pleasant 37 years old African American male with metastatic non-small cell lung cancer, adenocarcinoma currently undergoing systemic chemotherapy with carboplatin and Alimta status post 3 cycles and tolerating his treatment fairly well. The patient had partial improvement in his disease on the recent CT scan of the chest, abdomen and pelvis. The patient was discussed with and seen by Dr. Arbutus Ped. He will proceed with cycle #5 today as scheduled. He continue with weekly labs also as scheduled. He will follow up in 3 weeks prior to cycle #6.  Patient was advised to take his folic acid and Decadron premedication  as prescribed. Patient states that his current anti-emetics do not help with his nausea. He prefers to just deal with it until it subsides. His weight is up by one pound.  Marketta Valadez E, PA-C   He was advised to call immediately if he has any concerning symptoms in the interval.  All questions were answered. The patient knows to call the clinic with any problems, questions or concerns. We can certainly see the patient much sooner if necessary.  ADDENDUM: Hematology/Oncology Attending: I have the face-to-face encounter with the patient today. I recommended his care plan. The patient is feeling fine today with no specific complaints. He tolerated the last cycle of her systemic chemotherapy fairly well with no significant adverse effects. I recommended for him to proceed with cycle #5 today as scheduled. The patient would come back for follow up visit in 3 weeks with the start of cycle #6 and this would be followed by restaging scan. He was advised to call immediately if he has any concerning symptoms in the interval.  Lajuana Matte., MD 10/14/2012

## 2012-10-14 NOTE — Patient Instructions (Addendum)
Continue with weekly labs as scheduled Follow up in 3 weeks, prior to your next scheduled cycle of chemotherapy 

## 2012-10-14 NOTE — Progress Notes (Signed)
Patient states he feels like he is eating better.  His weight has increased to 138.4 pounds on August 4, from 135.3 pounds on June 17.  Patient reports he no longer tolerates oral nutrition supplements.  He is trying to eat more food and states he tolerates chicken wings.  However, most other foods are not well tolerated.  He denies nausea and vomiting at this time.  He states he expects nausea and vomiting will resume after chemotherapy.  Nutrition diagnosis: Inadequate oral intake has improved.  Intervention: I educated patient to continue small frequent meals and well-tolerated foods that are high in calories and protein frequently throughout the day.  I've again encouraged him to alter temperatures of foods for increased tolerance.  Patient verbalizes understanding.  Teach back method used.  Monitoring, evaluation, goals: Patient will continue to tolerate adequate calories and protein to minimize weight loss throughout treatment.    Next visit: Patient will contact me for questions or concerns.

## 2012-10-14 NOTE — Telephone Encounter (Signed)
gv and printed appt sched and avs for pt  °

## 2012-10-21 ENCOUNTER — Other Ambulatory Visit (HOSPITAL_BASED_OUTPATIENT_CLINIC_OR_DEPARTMENT_OTHER): Payer: 59

## 2012-10-21 DIAGNOSIS — C3410 Malignant neoplasm of upper lobe, unspecified bronchus or lung: Secondary | ICD-10-CM

## 2012-10-21 DIAGNOSIS — C3491 Malignant neoplasm of unspecified part of right bronchus or lung: Secondary | ICD-10-CM

## 2012-10-21 LAB — COMPREHENSIVE METABOLIC PANEL (CC13)
ALT: 9 U/L (ref 0–55)
AST: 11 U/L (ref 5–34)
Alkaline Phosphatase: 109 U/L (ref 40–150)
Creatinine: 0.8 mg/dL (ref 0.7–1.3)
Total Bilirubin: 0.36 mg/dL (ref 0.20–1.20)

## 2012-10-21 LAB — CBC WITH DIFFERENTIAL/PLATELET
BASO%: 0 % (ref 0.0–2.0)
EOS%: 0.4 % (ref 0.0–7.0)
LYMPH%: 21.8 % (ref 14.0–49.0)
MCHC: 33.5 g/dL (ref 32.0–36.0)
MCV: 88.8 fL (ref 79.3–98.0)
MONO%: 10.3 % (ref 0.0–14.0)
Platelets: 137 10*3/uL — ABNORMAL LOW (ref 140–400)
RBC: 4.2 10*6/uL (ref 4.20–5.82)
RDW: 15.2 % — ABNORMAL HIGH (ref 11.0–14.6)
nRBC: 0 % (ref 0–0)

## 2012-10-28 ENCOUNTER — Other Ambulatory Visit (HOSPITAL_BASED_OUTPATIENT_CLINIC_OR_DEPARTMENT_OTHER): Payer: 59

## 2012-10-28 DIAGNOSIS — C3491 Malignant neoplasm of unspecified part of right bronchus or lung: Secondary | ICD-10-CM

## 2012-10-28 DIAGNOSIS — C3490 Malignant neoplasm of unspecified part of unspecified bronchus or lung: Secondary | ICD-10-CM

## 2012-10-28 LAB — COMPREHENSIVE METABOLIC PANEL (CC13)
ALT: 8 U/L (ref 0–55)
CO2: 26 mEq/L (ref 22–29)
Creatinine: 1 mg/dL (ref 0.7–1.3)
Glucose: 96 mg/dl (ref 70–140)
Total Bilirubin: 0.43 mg/dL (ref 0.20–1.20)

## 2012-10-28 LAB — CBC WITH DIFFERENTIAL/PLATELET
BASO%: 0.2 % (ref 0.0–2.0)
HCT: 36.2 % — ABNORMAL LOW (ref 38.4–49.9)
LYMPH%: 20.5 % (ref 14.0–49.0)
MCH: 30.3 pg (ref 27.2–33.4)
MCHC: 33.3 g/dL (ref 32.0–36.0)
MCV: 91.2 fL (ref 79.3–98.0)
MONO#: 0.6 10*3/uL (ref 0.1–0.9)
NEUT%: 56.8 % (ref 39.0–75.0)
Platelets: 89 10*3/uL — ABNORMAL LOW (ref 140–400)
WBC: 3 10*3/uL — ABNORMAL LOW (ref 4.0–10.3)

## 2012-11-04 ENCOUNTER — Telehealth: Payer: Self-pay | Admitting: Internal Medicine

## 2012-11-04 ENCOUNTER — Encounter: Payer: Self-pay | Admitting: Internal Medicine

## 2012-11-04 ENCOUNTER — Other Ambulatory Visit: Payer: 59 | Admitting: Lab

## 2012-11-04 ENCOUNTER — Ambulatory Visit: Payer: 59

## 2012-11-04 ENCOUNTER — Other Ambulatory Visit (HOSPITAL_BASED_OUTPATIENT_CLINIC_OR_DEPARTMENT_OTHER): Payer: 59 | Admitting: Lab

## 2012-11-04 ENCOUNTER — Telehealth: Payer: Self-pay | Admitting: *Deleted

## 2012-11-04 ENCOUNTER — Ambulatory Visit (HOSPITAL_BASED_OUTPATIENT_CLINIC_OR_DEPARTMENT_OTHER): Payer: 59 | Admitting: Internal Medicine

## 2012-11-04 DIAGNOSIS — C778 Secondary and unspecified malignant neoplasm of lymph nodes of multiple regions: Secondary | ICD-10-CM

## 2012-11-04 DIAGNOSIS — C3410 Malignant neoplasm of upper lobe, unspecified bronchus or lung: Secondary | ICD-10-CM

## 2012-11-04 DIAGNOSIS — F17200 Nicotine dependence, unspecified, uncomplicated: Secondary | ICD-10-CM

## 2012-11-04 DIAGNOSIS — R42 Dizziness and giddiness: Secondary | ICD-10-CM

## 2012-11-04 DIAGNOSIS — R110 Nausea: Secondary | ICD-10-CM

## 2012-11-04 DIAGNOSIS — C3491 Malignant neoplasm of unspecified part of right bronchus or lung: Secondary | ICD-10-CM

## 2012-11-04 DIAGNOSIS — C3490 Malignant neoplasm of unspecified part of unspecified bronchus or lung: Secondary | ICD-10-CM

## 2012-11-04 LAB — CBC WITH DIFFERENTIAL/PLATELET
Basophils Absolute: 0 10*3/uL (ref 0.0–0.1)
Eosinophils Absolute: 0.1 10*3/uL (ref 0.0–0.5)
HCT: 37.7 % — ABNORMAL LOW (ref 38.4–49.9)
LYMPH%: 21.3 % (ref 14.0–49.0)
MONO#: 0.4 10*3/uL (ref 0.1–0.9)
NEUT#: 2.1 10*3/uL (ref 1.5–6.5)
NEUT%: 64 % (ref 39.0–75.0)
Platelets: 114 10*3/uL — ABNORMAL LOW (ref 140–400)
WBC: 3.3 10*3/uL — ABNORMAL LOW (ref 4.0–10.3)

## 2012-11-04 MED ORDER — FOLIC ACID 1 MG PO TABS: 1.0000 mg | ORAL_TABLET | Freq: Every day | ORAL | Status: DC

## 2012-11-04 NOTE — Telephone Encounter (Signed)
Per staff message and POF I have scheduled appts.  JMW  

## 2012-11-04 NOTE — Telephone Encounter (Signed)
Gave pt appt for lab, MRI, CT and chemo for August and September 2014, pt was upset that appts were late in the day, chemo and MRI was moved due to time. All appts hasd been scheduled to early am, no indications that this pt prefers early am due to work hence appt was all schduled before lunch, gave pt oral contrast for Ct and informed him that appt is in [facility] imaging [employer] location and  [facility] location

## 2012-11-04 NOTE — Progress Notes (Signed)
[facility] Telephone:(336) [phone]   Fax:(336) [phone]  OFFICE PROGRESS NOTE  Default, Provider, MD [address] [place] [address]  DIAGNOSIS: Metastatic non-small cell lung cancer, adenocarcinoma with unknown status of the EGFR mutation or ALK gene translocation secondary to insufficient material, diagnosed in April 2014.   PRIOR THERAPY:  1) stereotactic radiotherapy under the care of Dr. Kathrynn Running completed on 07/18/2012.  2) palliative radiotherapy to the left upper lobe and mediastinal lymph nodes. Completed 09/10/2012   CURRENT THERAPY:  1) Systemic chemotherapy with carboplatin for AUC of 5 and Alimta 500 mg/M2 giving every 3 weeks. First dose was given on 07/15/2012. Status post 5 cycles.  CHEMOTHERAPY INTENT: Palliative  CURRENT # OF CHEMOTHERAPY CYCLES: 6  CURRENT ANTIEMETICS: Zofran, dexamethasone and Compazine  CURRENT SMOKING STATUS: Current smoker and I strongly advise him to quit smoking  ORAL CHEMOTHERAPY AND CONSENT: None  CURRENT BISPHOSPHONATES USE: None    PAIN MANAGEMENT: 0/10  NARCOTICS INDUCED CONSTIPATION: None  LIVING WILL AND CODE STATUS: Full code  INTERVAL HISTORY: Terrance Clark 37 y.o. male returns to the clinic today for followup visit. The patient is feeling fine today with no specific complaints except for occasional and intermittent nausea. He has Compazine and Zofran with some improvement but that he does not take it regularly his nausea because he doesn't think it looks. He denied having any pain. The patient denied having any significant chest pain, shortness of breath, cough or hemoptysis. He has occasional dizzy spells. No significant weight loss or night sweats. He tolerated the last cycle of his systemic chemotherapy fairly well.  MEDICAL HISTORY: Past Medical History  Diagnosis Date  . H/O oral surgery 04/2012  . Cough   . Shortness of breath     with exertion  . Insomnia     d/t pain and takes Goody's PM    . Lung cancer 06/2012    RUL lung  . Hx of radiation therapy 07/16/12- 09/10/12    lower neck and upper chest region , 63 gray in 35 fx  . 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 Hx of radiation therapy 07/18/12    SRS to brain metastasis    ALLERGIES:  has No Known Allergies.  MEDICATIONS:  Current Outpatient Prescriptions  Medication Sig Dispense Refill  . folic acid (FOLVITE) 1 MG tablet Take 1 tablet (1 mg total) by mouth daily.  30 tablet  2   No current facility-administered medications for this visit.    SURGICAL HISTORY:  Past Surgical History  Procedure Laterality Date  . Foot surgery    . Mouth surgery    . Video bronchoscopy with endobronchial ultrasound Right 06/19/2012    Procedure: VIDEO BRONCHOSCOPY WITH ENDOBRONCHIAL ULTRASOUND;  Surgeon: Leslye Peer, MD;  Location: [facility] OR;  Service: Pulmonary;  Laterality: Right;    REVIEW OF SYSTEMS:  A comprehensive review of systems was negative except for: Gastrointestinal: positive for nausea   PHYSICAL EXAMINATION: General appearance: alert, cooperative and no distress Head: Normocephalic, without obvious abnormality, atraumatic Neck: no adenopathy Lymph nodes: Cervical, supraclavicular, and axillary nodes normal. Resp: clear to auscultation bilaterally Cardio: regular rate and rhythm, S1, S2 normal, no murmur, click, rub or gallop GI: soft, non-tender; bowel sounds normal; no masses,  no organomegaly Extremities: extremities normal, atraumatic, no cyanosis or edema  ECOG PERFORMANCE STATUS: 1 - Symptomatic but completely ambulatory  There were no vitals taken for this visit.  LABORATORY DATA: Lab Results  Component  Value Date   WBC 3.3* 11/04/2012   HGB 12.4* 11/04/2012   HCT 37.7* 11/04/2012   MCV 91.7 11/04/2012   PLT 114* 11/04/2012      Chemistry      Component Value Date/Time   NA 141 10/28/2012 1050   NA 141 06/19/2012 0800   K 3.6 10/28/2012 1050   K 3.9 06/19/2012 0800   CL 103 09/02/2012 0955   CL 105 06/19/2012 0800    CO2 26 10/28/2012 1050   CO2 25 06/19/2012 0800   BUN 7.2 10/28/2012 1050   BUN 18 06/19/2012 0800   CREATININE 1.0 10/28/2012 1050   CREATININE 0.86 06/19/2012 0800      Component Value Date/Time   CALCIUM 9.0 10/28/2012 1050   CALCIUM 9.5 06/19/2012 0800   ALKPHOS 103 10/28/2012 1050   AST 10 10/28/2012 1050   ALT 8 10/28/2012 1050   BILITOT 0.43 10/28/2012 1050       RADIOGRAPHIC STUDIES: No results found.  ASSESSMENT AND PLAN: This is a very pleasant 37 years old African American male with metastatic non-small cell lung cancer, adenocarcinoma currently undergoing systemic chemotherapy with carboplatin and Alimta status post 5 cycles. He is tolerating his treatment fairly well except for occasional nausea. I strongly encouraged the patient to take his nausea medication. I recommended for him to proceed with cycle #6 today as scheduled. He would come back for followup visit in 3 weeks with repeat CT scan of the chest, abdomen and pelvis for restaging of his disease. For smoke cessation, I advised the patient to quit smoking and offered him to smoke cessation program. He was advised to call immediately if he has any concerning symptoms in the interval.  The patient voices understanding of current disease status and treatment options and is in agreement with the current care plan.  All questions were answered. The patient knows to call the clinic with any problems, questions or concerns. We can certainly see the patient much sooner if necessary.

## 2012-11-04 NOTE — Patient Instructions (Signed)
CURRENT THERAPY:  1) Systemic chemotherapy with carboplatin for AUC of 5 and Alimta 500 mg/M2 giving every 3 weeks. First dose was given on 07/15/2012. Status post 5 cycles.  CHEMOTHERAPY INTENT: Palliative  CURRENT # OF CHEMOTHERAPY CYCLES: 6  CURRENT ANTIEMETICS: Zofran, dexamethasone and Compazine  CURRENT SMOKING STATUS: Current smoker and I strongly advise him to quit smoking  ORAL CHEMOTHERAPY AND CONSENT: None  CURRENT BISPHOSPHONATES USE: None  PAIN MANAGEMENT: 0/10  NARCOTICS INDUCED CONSTIPATION: None  LIVING WILL AND CODE STATUS: Full code

## 2012-11-05 ENCOUNTER — Other Ambulatory Visit: Payer: 59

## 2012-11-05 ENCOUNTER — Ambulatory Visit (HOSPITAL_BASED_OUTPATIENT_CLINIC_OR_DEPARTMENT_OTHER): Payer: 59

## 2012-11-05 VITALS — BP 121/86 | HR 70 | Temp 97.9°F | Resp 20

## 2012-11-05 DIAGNOSIS — Z5111 Encounter for antineoplastic chemotherapy: Secondary | ICD-10-CM

## 2012-11-05 DIAGNOSIS — C3410 Malignant neoplasm of upper lobe, unspecified bronchus or lung: Secondary | ICD-10-CM

## 2012-11-05 DIAGNOSIS — C3492 Malignant neoplasm of unspecified part of left bronchus or lung: Secondary | ICD-10-CM

## 2012-11-05 DIAGNOSIS — C778 Secondary and unspecified malignant neoplasm of lymph nodes of multiple regions: Secondary | ICD-10-CM

## 2012-11-05 MED ORDER — SODIUM CHLORIDE 0.9 % IV SOLN
Freq: Once | INTRAVENOUS | Status: AC
  Administered 2012-11-05: 08:00:00 via INTRAVENOUS

## 2012-11-05 MED ORDER — ONDANSETRON 16 MG/50ML IVPB (CHCC)
16.0000 mg | Freq: Once | INTRAVENOUS | Status: AC
  Administered 2012-11-05: 16 mg via INTRAVENOUS

## 2012-11-05 MED ORDER — DEXAMETHASONE SODIUM PHOSPHATE 20 MG/5ML IJ SOLN
20.0000 mg | Freq: Once | INTRAMUSCULAR | Status: AC
  Administered 2012-11-05: 20 mg via INTRAVENOUS

## 2012-11-05 MED ORDER — SODIUM CHLORIDE 0.9 % IV SOLN
500.0000 mg/m2 | Freq: Once | INTRAVENOUS | Status: AC
  Administered 2012-11-05: 900 mg via INTRAVENOUS
  Filled 2012-11-05: qty 36

## 2012-11-05 MED ORDER — SODIUM CHLORIDE 0.9 % IV SOLN
592.0000 mg | Freq: Once | INTRAVENOUS | Status: AC
  Administered 2012-11-05: 590 mg via INTRAVENOUS
  Filled 2012-11-05: qty 59

## 2012-11-05 NOTE — Patient Instructions (Addendum)
[facility] Discharge Instructions for Patients Receiving Chemotherapy  Today you received the following chemotherapy agents Alimta and Carboplatin.  To help prevent nausea and vomiting after your treatment, we encourage you to take your nausea medication as prescribed.   If you develop nausea and vomiting that is not controlled by your nausea medication, call the clinic.   BELOW ARE SYMPTOMS THAT SHOULD BE REPORTED IMMEDIATELY:  *FEVER GREATER THAN 100.5 F  *CHILLS WITH OR WITHOUT FEVER  NAUSEA AND VOMITING THAT IS NOT CONTROLLED WITH YOUR NAUSEA MEDICATION  *UNUSUAL SHORTNESS OF BREATH  *UNUSUAL BRUISING OR BLEEDING  TENDERNESS IN MOUTH AND THROAT WITH OR WITHOUT PRESENCE OF ULCERS  *URINARY PROBLEMS  *BOWEL PROBLEMS  UNUSUAL RASH Items with * indicate a potential emergency and should be followed up as soon as possible.  Feel free to call the clinic you have any questions or concerns. The clinic phone number is [phone].    

## 2012-11-07 ENCOUNTER — Ambulatory Visit
Admission: RE | Admit: 2012-11-07 | Discharge: 2012-11-07 | Disposition: A | Discharge status: Home / Self Care | Payer: 59 | Source: Ambulatory Visit | Attending: Radiation Oncology | Admitting: Radiation Oncology

## 2012-11-07 ENCOUNTER — Other Ambulatory Visit: Payer: 59

## 2012-11-07 DIAGNOSIS — C7931 Secondary malignant neoplasm of brain: Secondary | ICD-10-CM

## 2012-11-07 MED ORDER — GADOBENATE DIMEGLUMINE 529 MG/ML IV SOLN
12.0000 mL | Freq: Once | INTRAVENOUS | Status: AC | PRN
  Administered 2012-11-07: 12 mL via INTRAVENOUS

## 2012-11-08 ENCOUNTER — Other Ambulatory Visit: Payer: 59

## 2012-11-15 ENCOUNTER — Other Ambulatory Visit: Payer: Self-pay | Admitting: Radiation Therapy

## 2012-11-15 DIAGNOSIS — C7931 Secondary malignant neoplasm of brain: Secondary | ICD-10-CM

## 2012-11-21 ENCOUNTER — Other Ambulatory Visit: Payer: Self-pay | Admitting: Internal Medicine

## 2012-11-21 DIAGNOSIS — C3492 Malignant neoplasm of unspecified part of left bronchus or lung: Secondary | ICD-10-CM

## 2012-11-22 ENCOUNTER — Other Ambulatory Visit: Payer: 59

## 2012-11-22 ENCOUNTER — Telehealth: Payer: Self-pay | Admitting: Internal Medicine

## 2012-11-22 NOTE — Telephone Encounter (Signed)
Per 9/11 pof the md wants al lof the ct scan appts cancelled and a pet scan to be scheduled which has done by the rad dept. Rad will notify the pt of the pet scan appt.

## 2012-11-26 ENCOUNTER — Ambulatory Visit: Payer: 59 | Admitting: Internal Medicine

## 2012-11-26 ENCOUNTER — Other Ambulatory Visit: Payer: 59

## 2012-12-03 ENCOUNTER — Encounter (HOSPITAL_COMMUNITY): Admission: RE | Admit: 2012-12-03 | Payer: 59 | Source: Ambulatory Visit

## 2012-12-18 ENCOUNTER — Encounter (HOSPITAL_COMMUNITY): Payer: Self-pay

## 2012-12-18 ENCOUNTER — Encounter (HOSPITAL_COMMUNITY)
Admission: RE | Admit: 2012-12-18 | Discharge: 2012-12-18 | Disposition: A | Discharge status: Home / Self Care | Payer: 59 | Source: Ambulatory Visit | Attending: Internal Medicine | Admitting: Internal Medicine

## 2012-12-18 DIAGNOSIS — C3492 Malignant neoplasm of unspecified part of left bronchus or lung: Secondary | ICD-10-CM

## 2012-12-18 DIAGNOSIS — C3490 Malignant neoplasm of unspecified part of unspecified bronchus or lung: Secondary | ICD-10-CM | POA: Insufficient documentation

## 2012-12-18 DIAGNOSIS — J438 Other emphysema: Secondary | ICD-10-CM | POA: Insufficient documentation

## 2012-12-18 DIAGNOSIS — C771 Secondary and unspecified malignant neoplasm of intrathoracic lymph nodes: Secondary | ICD-10-CM | POA: Insufficient documentation

## 2012-12-18 DIAGNOSIS — C7931 Secondary malignant neoplasm of brain: Secondary | ICD-10-CM | POA: Insufficient documentation

## 2012-12-18 LAB — GLUCOSE, CAPILLARY: Glucose-Capillary: 93 mg/dL (ref 70–99)

## 2012-12-18 MED ORDER — FLUDEOXYGLUCOSE F - 18 (FDG) INJECTION
18.9000 | Freq: Once | INTRAVENOUS | Status: AC | PRN
  Administered 2012-12-18: 18.9 via INTRAVENOUS

## 2013-01-21 ENCOUNTER — Ambulatory Visit
Admission: RE | Admit: 2013-01-21 | Discharge: 2013-01-21 | Disposition: A | Discharge status: Home / Self Care | Payer: 59 | Source: Ambulatory Visit | Attending: Radiation Oncology | Admitting: Radiation Oncology

## 2013-01-21 ENCOUNTER — Other Ambulatory Visit: Payer: 59

## 2013-01-21 ENCOUNTER — Other Ambulatory Visit: Payer: Self-pay | Admitting: Radiation Oncology

## 2013-01-21 DIAGNOSIS — C7931 Secondary malignant neoplasm of brain: Secondary | ICD-10-CM

## 2013-01-22 ENCOUNTER — Telehealth: Payer: Self-pay | Admitting: Radiation Oncology

## 2013-01-22 ENCOUNTER — Ambulatory Visit
Admission: RE | Admit: 2013-01-22 | Discharge: 2013-01-22 | Disposition: A | Discharge status: Home / Self Care | Payer: 59 | Source: Ambulatory Visit | Attending: Radiation Oncology | Admitting: Radiation Oncology

## 2013-01-22 DIAGNOSIS — C7931 Secondary malignant neoplasm of brain: Secondary | ICD-10-CM

## 2013-01-22 MED ORDER — LORAZEPAM 1 MG PO TABS: 1.0000 mg | ORAL_TABLET | Freq: Three times a day (TID) | ORAL | Status: DC

## 2013-01-22 NOTE — Telephone Encounter (Signed)
Per Dr. Broadus John order phoned in ativan 1 mg tablet. Take 1 tablet by mouth every 8 hours prn anxiety. Also, take one tablet 30 minutes before MRI. Also, left message for patient reference medication to pick up at [facility]. Also, spoke with patient's brother, Earnest Rosier making him aware as well.

## 2013-01-23 ENCOUNTER — Other Ambulatory Visit: Payer: Self-pay | Admitting: Radiation Therapy

## 2013-01-23 ENCOUNTER — Ambulatory Visit
Admission: RE | Admit: 2013-01-23 | Discharge: 2013-01-23 | Disposition: A | Discharge status: Home / Self Care | Payer: 59 | Source: Ambulatory Visit | Attending: Radiation Oncology | Admitting: Radiation Oncology

## 2013-01-23 DIAGNOSIS — C7931 Secondary malignant neoplasm of brain: Secondary | ICD-10-CM

## 2013-01-23 MED ORDER — GADOBENATE DIMEGLUMINE 529 MG/ML IV SOLN: 13.0000 mL | Freq: Once | INTRAVENOUS | Status: DC | PRN

## 2013-01-24 ENCOUNTER — Encounter: Payer: Self-pay | Admitting: Radiation Oncology

## 2013-01-24 NOTE — Progress Notes (Signed)
Location/Histology of Brain Tumor: Non-small cell lung cancer with brain metastases, stage IV  Patient presented with symptoms of:  Occasional and intermittent nausea, occasional dizzy spells  Past or anticipated interventions, if any, per neurosurgery: None  Past or anticipated interventions, if any, per medical oncology: completed six systemic chemotherapy cycles of carboplatin and Alimta   Dose of Decadron, if applicable: Not at this time  Recent neurologic symptoms, if any:   Seizures: None noted  Headaches: None noted  Nausea: ocassional intermittent nausea  Dizziness/ataxia: occasional dizzy spells  Difficulty with hand coordination: None noted  Focal numbness/weakness: None noted   Visual deficits/changes: None noted  Confusion/Memory deficits: None noted  Painful bone metastases at present, if any: Denies pain in bones or chest  SAFETY ISSUES:  Prior radiation?   Indication for treatment: Definitive treatment along with radiosensitizing chemotherapy and stereotactic radiosurgery to the brain  Radiation treatment dates: Jul 16 2012 through 09/10/2012, stereotactic radiosurgery for the brain metastasis was performed on 07/18/2012  Site/dose: Lower neck and upper chest region, 63 gray in 35 fractions    Pacemaker/ICD? No  Possible current pregnancy? No  Is the patient on methotrexate? No  Additional Complaints / other details: 37 year old male. Continues to smoke.   01/23/13 MRI revealed: IMPRESSION:  1. Interval progression of metastatic disease to the brain. 7  new/increased bilateral cerebral metastases. One additional punctate  indeterminate area in the left parietal lobe on image 132, recommend  attention on followup.  2. Interval mixed response of the right superior peri-Rolandic  metastasis, the rim enhancement of which appears mildly increased in  thickness, but with interval decreased associated T2 and FLAIR  hyperintensity. I favor this  represents treatment effect rather than  true progression, with attention directed on followup.  3. The other 2 known metastases are stable to mildly decreased.  4. All lesions annotated on post count Trask the axial series 5.

## 2013-01-27 ENCOUNTER — Encounter: Payer: Self-pay | Admitting: Radiation Oncology

## 2013-01-27 ENCOUNTER — Ambulatory Visit: Payer: 59

## 2013-01-27 ENCOUNTER — Ambulatory Visit
Admission: RE | Admit: 2013-01-27 | Discharge: 2013-01-27 | Disposition: A | Discharge status: Home / Self Care | Payer: 59 | Source: Ambulatory Visit | Attending: Radiation Oncology | Admitting: Radiation Oncology

## 2013-01-27 ENCOUNTER — Ambulatory Visit: Payer: 59 | Admitting: Lab

## 2013-01-27 VITALS — BP 118/76 | HR 88 | Temp 98.5°F | Wt 138.1 lb

## 2013-01-27 DIAGNOSIS — C7931 Secondary malignant neoplasm of brain: Secondary | ICD-10-CM

## 2013-01-27 DIAGNOSIS — C3490 Malignant neoplasm of unspecified part of unspecified bronchus or lung: Secondary | ICD-10-CM | POA: Insufficient documentation

## 2013-01-27 DIAGNOSIS — Z923 Personal history of irradiation: Secondary | ICD-10-CM | POA: Insufficient documentation

## 2013-01-27 LAB — BUN AND CREATININE (CC13): Creatinine: 1.1 mg/dL (ref 0.7–1.3)

## 2013-01-27 NOTE — Progress Notes (Signed)
Radiation Oncology         [phone]) [phone] ________________________________  Name: Terrance Clark MRN: [id number]  Date: 01/27/2013  DOB: 09/28/1975  Follow-Up Visit Note  CC: Default, Provider, MD  Clydene Fake, MD  Diagnosis:   37 year old gentleman with stage T1 N3 M1 non-small cell lung cancer recently diagnosed with 6 new brain metastases- stage IVa  Interval Since Last Radiation:  6  months  Narrative:  The patient returns today for routine follow-up.  He had a follow-up MRI showing new brain mets.                              ALLERGIES:  has No Known Allergies.  Meds: No current outpatient prescriptions on file.   No current facility-administered medications for this encounter.    Physical Findings: The patient is in no acute distress. Patient is alert and oriented.  weight is 138 lb 1.6 oz (62.642 kg). His temperature is 98.5 F (36.9 C). His blood pressure is 118/76 and his pulse is 88. His oxygen saturation is 100%. .  No significant changes.  Lab Findings: Lab Results  Component Value Date   WBC 3.3* 11/04/2012   HGB 12.4* 11/04/2012   HCT 37.7* 11/04/2012   MCV 91.7 11/04/2012   PLT 114* 11/04/2012    @LASTCHEM @  Radiographic Findings: Mr Lodema Pilot Contrast  01/23/2013   CLINICAL DATA:  37 year old male with history of lung cancer metastatic to the brain. Restaging after stereotactic radiosurgery. Subsequent encounter.  EXAM: MRI HEAD WITHOUT AND WITH CONTRAST  TECHNIQUE: Multiplanar, multiecho pulse sequences of the brain and surrounding structures were obtained without and with intravenous contrast.  CONTRAST:  13 mL MultiHance.  COMPARISON:  11/07/2012 and earlier.  FINDINGS: T2 and FLAIR hyperintensity at the right superior frontal gyrus has diminished (series 7 images 60 - 64). At the same time trace diffusion signal at this site has mildly increased, but is not associated with the definite diffusion restriction. Stable post treatment blood products at this  site.  Otherwise stable precontrast MRI appearance of the brain. No ventriculomegaly. No restricted diffusion or evidence of acute infarction. Major intracranial vascular flow voids are stable. Negative pituitary, cervicomedullary junction and visualized cervical spine. Normal bone marrow signal.  Visualized orbit soft tissues are within normal limits. Visualized paranasal sinuses and mastoids are clear. Stable scalp soft tissues.  The patient experienced some seizure-like activity during initial contrast administration on 01/21/2013. He recovered spontaneously, was subsequently evaluated by EMS, and discharged home in good condition. He returned for post-contrast imaging on 01/23/2013.  Post-contrast images demonstrate progression of disease:    New punctate metastasis in the left occipital lobe on series 5, image 80.    New 3 mm metastasis at the left anterior insula on image 87 (see also sagittal post-contrast series 7, image 29).    New 2-3 mm metastasis at the left posterior lateral thalamus on image 91.    New 2-3 mm metastasis in the right occipital parietal sulcus on image 96 (see also sagittal post-contrast series 7, image 16).    New 3-4 mm metastasis in the left parietal lobe on image 118.    New 5 mm metastasis in the right parietal lobe on image 123.    Rim enhancing 12 mm peripheral anterior right frontal lobe on image 125 has increased and was subtle in August. This metastasis also demonstrates evidence of trace blood products on T2* imaging.  Furthermore, thickness of the residual enhancing rim of the known right superior peri-Rolandic metastasis appears increased (series 5, image 138).  At the same time the known right posterior parietal metastasis (image 133) and no new right full amic metastasis (image 103) are stable to decreased.  Additionally, there is a conspicuous punctate area of enhancement in the left parietal lobe on image 132. This is not seen on the sagittal or coronal  post-contrast images, and indeterminate at this time.  IMPRESSION: 1. Interval progression of metastatic disease to the brain. 7 new/increased bilateral cerebral metastases. One additional punctate indeterminate area in the left parietal lobe on image 132, recommend attention on followup.  2. Interval mixed response of the right superior peri-Rolandic metastasis, the rim enhancement of which appears mildly increased in thickness, but with interval decreased associated T2 and FLAIR hyperintensity. I favor this represents treatment effect rather than true progression, with attention directed on followup.  3.  The other 2 known metastases are stable to mildly decreased.  4. All lesions annotated on post count Trask the axial series 5.   Electronically Signed   By: Augusto Gamble M.D.   On: 01/23/2013 15:16   Impression:  The patient has 7 new brain mets amenable to [facility].  At this point, the patient would potentially benefit from radiotherapy. The options include whole brain irradiation versus stereotactic radiosurgery. There are pros and cons associated with each of these potential treatment options. Whole brain radiotherapy would treat the known metastatic deposits and help provide some reduction of risk for future brain metastases. However, whole brain radiotherapy carries potential risks including hair loss, subacute somnolence, and neurocognitive changes including a possible reduction in short-term memory. Whole brain radiotherapy also may carry a lower likelihood of tumor control at the treatment sites because of the low-dose used. Stereotactic radiosurgery carries a higher likelihood for local tumor control at the targeted sites with lower associated risk for neurocognitive changes such as memory loss. However, the use of stereotactic radiosurgery in this setting may leave the patient at increased risk for new brain metastases elsewhere in the brain as high as 50-60%. Accordingly, patients who receive stereotactic  radiosurgery in this setting should undergo ongoing surveillance imaging with brain MRI more frequently in order to identify and treat new small brain metastases before they become symptomatic. Stereotactic radiosurgery does carry some different risks, including a risk of radionecrosis.  PLAN: Today, I reviewed the findings and workup thus far with the patient. We discussed the dilemma regarding whole brain radiotherapy versus stereotactic radiosurgery. We discussed the pros and cons of each. We also discussed the logistics and delivery of each. We reviewed the results associated with each of the treatments described above. The patient seems to understand the treatment options and would like to proceed with stereotactic radiosurgery.  I spent 30 minutes minutes face to face with the patient and more than 50% of that time was spent in counseling and/or coordination of care.   _____________________________________  Artist Pais. Kathrynn Running, M.D.

## 2013-01-27 NOTE — Progress Notes (Signed)
Please see the Nurse Progress Note in the MD Initial Consult Encounter for this patient. 

## 2013-01-27 NOTE — Progress Notes (Signed)
Patient here for re consultation of 7 new brain mets with initial  diagnosis non-small cell lung cancer.States he feels great, asymptomatic.

## 2013-01-28 ENCOUNTER — Ambulatory Visit
Admission: RE | Admit: 2013-01-28 | Discharge: 2013-01-28 | Disposition: A | Discharge status: Home / Self Care | Payer: 59 | Source: Ambulatory Visit | Attending: Radiation Oncology | Admitting: Radiation Oncology

## 2013-01-28 DIAGNOSIS — C7931 Secondary malignant neoplasm of brain: Secondary | ICD-10-CM

## 2013-01-28 NOTE — Progress Notes (Signed)
Terrance Clark for simulation today.  IV start with a 22 gauge angiocath in his left upper arm.  No voiced concerns.  Completed simulation at ~11:50am ad IV removed by Colen Darling, RN at 11:55am.  Disposition to home with friend.

## 2013-01-28 NOTE — Progress Notes (Signed)
  Radiation Oncology         [phone] ________________________________  Name: Terrance Clark  MRN: [id number]  Date: 01/28/2013  DOB: Jan 06, 1976  SIMULATION AND TREATMENT PLANNING NOTE  DIAGNOSIS:  37 year old gentleman with stage T1 N3 M1 non-small cell lung cancer recently diagnosed with 7 new brain metastases- stage IVa  NARRATIVE:  The patient was brought to the CT Simulation planning suite.  Identity was confirmed.  All relevant records and images related to the planned course of therapy were reviewed.  The patient freely provided informed written consent to proceed with treatment after reviewing the details related to the planned course of therapy. The consent form was witnessed and verified by the simulation staff. Intravenous access was established for contrast administration. Then, the patient was set-up in a stable reproducible supine position for radiation therapy.  A relocatable thermoplastic stereotactic head frame was fabricated for precise immobilization.  CT images were obtained.  Surface markings were placed.  The CT images were loaded into the planning software and fused with the patient's targeting MRI scan.  Then the target and avoidance structures were contoured.  Treatment planning then occurred.  The radiation prescription was entered and confirmed.  I have requested 3D planning  I have requested a DVH of the following structures: Brain stem, brain, left eye, right I, lenses, optic chiasm, target volumes, uninvolved brain, and normal tissue.    PLAN:  The patient will receive 20 Gy in 1 fraction to seven new brain mets.  ________________________________  [name], M.D.

## 2013-01-31 ENCOUNTER — Ambulatory Visit
Admission: RE | Admit: 2013-01-31 | Discharge: 2013-01-31 | Disposition: A | Discharge status: Home / Self Care | Payer: 59 | Source: Ambulatory Visit | Attending: Radiation Oncology | Admitting: Radiation Oncology

## 2013-01-31 ENCOUNTER — Encounter: Payer: Self-pay | Admitting: Radiation Oncology

## 2013-01-31 VITALS — BP 132/78 | HR 78 | Temp 97.8°F | Resp 16

## 2013-01-31 DIAGNOSIS — C7931 Secondary malignant neoplasm of brain: Secondary | ICD-10-CM

## 2013-01-31 NOTE — Progress Notes (Signed)
One hour nurse monitoring following srs treatment complete. Patient denies nausea, headache, dizziness, or diplopia. Patient denies taking any medication at this time even decadron. Patient reports that he plans to go home and rest tonight. Patient discharged home with significant other and encouraged to contact staff with needs.

## 2013-01-31 NOTE — Progress Notes (Signed)
  Radiation Oncology         [phone]) [phone] ________________________________  Stereotactic Treatment Procedure Note  Name: Shaun Runyon MRN: [id number]  Date: 01/31/2013  DOB: 1975-11-11  SPECIAL TREATMENT PROCEDURE  3D TREATMENT PLANNING AND DOSIMETRY:  The patient's radiation plan was reviewed and approved by neurosurgery and radiation oncology prior to treatment.  It showed 3-dimensional radiation distributions overlaid onto the planning CT/MRI image set.  The [facility] for the target structures as well as the organs at risk were reviewed. The documentation of the 3D plan and dosimetry are filed in the radiation oncology EMR.  NARRATIVE:  Pedram Goodchild was brought to the TrueBeam stereotactic radiation treatment machine and placed supine on the CT couch. The head frame was applied, and the patient was set up for stereotactic radiosurgery.  Neurosurgery was present for the set-up and delivery  SIMULATION VERIFICATION:  In the couch zero-angle position, the patient underwent Exactrac imaging using the Brainlab system with orthogonal KV images.  These were carefully aligned and repeated to confirm treatment position for each of the isocenters.  The Exactrac snap film verification was repeated at each couch angle.  SPECIAL TREATMENT PROCEDURE: [name] received stereotactic radiosurgery to the following targets:  Right frontal 12 mm target was treated using 3 Dynamic Conformal Arcs to a prescription dose of 20 Gy.  ExacTrac registration was performed for each couch angle.  The 83.3% isodose line was prescribed.  6 MV X-rays were delivered in the flattening filter mode.  Right parietal 5 mm target was treated using 3 Circular Arcs to a prescription dose of 20 Gy.  ExacTrac registration was performed for each couch angle.  The 92.6% isodose line was prescribed.  The 10 mm collimator was used.  6 MV X-rays were delivered in the flattening filter mode.  Left parietal 4 mm target was treated using 2  Circular Arcs to a prescription dose of 20 Gy.  ExacTrac registration was performed for each couch angle.  The 77.5% isodose line was prescribed.  The 7.5 mm collimator was used.  6 MV X-rays were delivered in the flattening filter mode.  Left insular 3 mm target was treated using 2 Circular Arcs to a prescription dose of 20 Gy.  ExacTrac registration was performed for each couch angle.  The 74.6% isodose line was prescribed.  The 7.5 mm collimator was used.  6 MV X-rays were delivered in the flattening filter mode.  STEREOTACTIC TREATMENT MANAGEMENT:  Following delivery, the patient was transported to nursing in stable condition and monitored for possible acute effects.  Vital signs were recorded BP 132/78  Pulse 78  Temp(Src) 97.8 F (36.6 C) (Oral)  Resp 16  SpO2 100%. The patient tolerated treatment without significant acute effects, and was discharged to home in stable condition.    PLAN: Follow-up in one month.  ________________________________  Artist Pais. Kathrynn Running, M.D.

## 2013-01-31 NOTE — Op Note (Signed)
  Name: Terrance Clark  MRN: [id number]  Date: 01/31/2013   DOB: 1975-10-11  Stereotactic Radiosurgery Operative Note  PRE-OPERATIVE DIAGNOSIS:  Multiple Brain Metastases  POST-OPERATIVE DIAGNOSIS:  Multiple Brain Metastases  PROCEDURE:  Stereotactic Radiosurgery  SURGEON:  Clydene Fake, MD  NARRATIVE: The patient underwent a radiation treatment planning session in the radiation oncology simulation suite under the care of the radiation oncology physician and physicist.  I participated closely in the radiation treatment planning afterwards. The patient underwent planning CT which was fused to 3T high resolution MRI with 1 mm axial slices.  These images were fused on the planning system.  We contoured the gross target volumes and subsequently expanded this to yield the Planning Target Volume. I actively participated in the planning process.  I helped to define and review the target contours and also the contours of the optic pathway, eyes, brainstem and selected nearby organs at risk.  All the dose constraints for critical structures were reviewed and compared to AAPM Task Group 101.  The prescription dose conformity was reviewed.  I approved the plan electronically.    Accordingly, [name] was brought to the TrueBeam stereotactic radiation treatment linac and placed in the custom immobilization mask.  The patient was aligned according to the IR fiducial markers with BrainLab Exactrac, then orthogonal x-rays were used in ExacTrac with the 6DOF robotic table and the shifts were made to align the patient  [name] received stereotactic radiosurgery uneventfully.    Lesions treated:  7   Complex lesions treated:  0 (>3.5 cm, <66mm of optic path, or within the brainstem)   The detailed description of the procedure is recorded in the radiation oncology procedure note.  I was present for the duration of the procedure.  DISPOSITION:  Following delivery, the patient was transported to nursing  in stable condition and monitored for possible acute effects to be discharged to home in stable condition with follow-up in one month.  Clydene Fake, MD 01/31/2013 3:46 PM

## 2013-02-03 ENCOUNTER — Ambulatory Visit
Admission: RE | Admit: 2013-02-03 | Discharge: 2013-02-03 | Disposition: A | Discharge status: Home / Self Care | Payer: 59 | Source: Ambulatory Visit | Attending: Radiation Oncology | Admitting: Radiation Oncology

## 2013-02-03 DIAGNOSIS — C7931 Secondary malignant neoplasm of brain: Secondary | ICD-10-CM

## 2013-02-03 NOTE — Progress Notes (Addendum)
  Radiation Oncology         [phone]) [phone] ________________________________  Stereotactic Treatment Procedure Note  Name: Terrance Clark  MRN: [id number]  Date: 02/05/2013  DOB: 1976/01/07  SPECIAL TREATMENT PROCEDURE  3D TREATMENT PLANNING AND DOSIMETRY:  The patient's radiation plan was reviewed and approved by neurosurgery and radiation oncology prior to treatment.  It showed 3-dimensional radiation distributions overlaid onto the planning CT/MRI image set.  The [facility] for the target structures as well as the organs at risk were reviewed. The documentation of the 3D plan and dosimetry are filed in the radiation oncology EMR.  NARRATIVE:  Illias Pantano was brought to the TrueBeam stereotactic radiation treatment machine and placed supine on the CT couch. The head frame was applied, and the patient was set up for stereotactic radiosurgery.  Neurosurgery was present for the set-up and delivery  SIMULATION VERIFICATION:  In the couch zero-angle position, the patient underwent Exactrac imaging using the Brainlab system with orthogonal KV images.  These were carefully aligned and repeated to confirm treatment position for each of the isocenters.  The Exactrac snap film verification was repeated at each couch angle.  SPECIAL TREATMENT PROCEDURE: [name] received stereotactic radiosurgery to the following targets:  Right occipital 3 mm target was treated using 2 Circular Arcs to a prescription dose of 20 Gy.  ExacTrac registration was performed for each couch angle.  The 73% isodose line was prescribed.  The 7.5 mm collimator was used.  6 MV X-rays were delivered in the flattening filter mode.  Left Thalamus 3 mm target was treated using 2 Circular Arcs to a prescription dose of 20 Gy.  ExacTrac registration was performed for each couch angle.  The 84.7% isodose line was prescribed.  The 7.5 mm collimator was used.  6 MV X-rays were delivered in the flattening filter mode.  Left Occipital 1 mm  target was treated using 2 Circular Arcs to a prescription dose of 20 Gy.  ExacTrac registration was performed for each couch angle.  The 77.5% isodose line was prescribed.  The 6 mm collimator was used.  6 MV X-rays were delivered in the flattening filter mode.  STEREOTACTIC TREATMENT MANAGEMENT:  Following delivery, the patient was transported to nursing in stable condition and monitored for possible acute effects.  Vital signs were recorded  Filed Vitals:   02/05/13 1320  BP: 132/78  Pulse: 76  Temp: 98.5 F (36.9 C)  Resp: 16    The patient tolerated treatment without significant acute effects, and was discharged to home in stable condition.    PLAN: Follow-up in one month. ------------------------------------------------  Artist Pais. Kathrynn Running, M.D.

## 2013-02-04 ENCOUNTER — Other Ambulatory Visit: Payer: 59

## 2013-02-05 ENCOUNTER — Encounter: Payer: Self-pay | Admitting: Radiation Oncology

## 2013-02-05 ENCOUNTER — Ambulatory Visit
Admission: RE | Admit: 2013-02-05 | Discharge: 2013-02-05 | Disposition: A | Discharge status: Home / Self Care | Payer: 59 | Source: Ambulatory Visit | Attending: Radiation Oncology | Admitting: Radiation Oncology

## 2013-02-05 ENCOUNTER — Ambulatory Visit: Payer: 59 | Admitting: Radiation Oncology

## 2013-02-05 VITALS — BP 132/78 | HR 76 | Temp 98.5°F | Resp 16

## 2013-02-05 DIAGNOSIS — C7931 Secondary malignant neoplasm of brain: Secondary | ICD-10-CM

## 2013-02-05 DIAGNOSIS — C3491 Malignant neoplasm of unspecified part of right bronchus or lung: Secondary | ICD-10-CM

## 2013-02-10 NOTE — Addendum Note (Signed)
Encounter addended by: Oneita Hurt, MD on: 02/10/2013 12:14 PM<BR>     Documentation filed: Notes Section

## 2013-02-24 NOTE — Progress Notes (Signed)
  Radiation Oncology         [phone] ________________________________  Name: Terrance Clark  MRN: [id number]  Date: 02/05/2013  DOB: 1975/11/24  End of Treatment Note  Diagnosis:  37 year old gentleman with stage T1 N3 M1 non-small cell lung cancer recently diagnosed with 7 new brain metastases- stage IVa  Indication for treatment:  Palliation       Radiation treatment dates:   01/31/2013 and 02/05/2013  Site/dose/Beams/energy:     On 01/31/2013, [name] received stereotactic radiosurgery to the following targets:    Right frontal 12 mm target was treated using 3 Dynamic Conformal Arcs to a prescription dose of 20 Gy. ExacTrac registration was performed for each couch angle. The 83.3% isodose line was prescribed. 6 MV X-rays were delivered in the flattening filter mode.    Right parietal 5 mm target was treated using 3 Circular Arcs to a prescription dose of 20 Gy. ExacTrac registration was performed for each couch angle. The 92.6% isodose line was prescribed. The 10 mm collimator was used. 6 MV X-rays were delivered in the flattening filter mode.    Left parietal 4 mm target was treated using 2 Circular Arcs to a prescription dose of 20 Gy. ExacTrac registration was performed for each couch angle. The 77.5% isodose line was prescribed. The 7.5 mm collimator was used. 6 MV X-rays were delivered in the flattening filter mode.    Left insular 3 mm target was treated using 2 Circular Arcs to a prescription dose of 20 Gy. ExacTrac registration  was performed for each couch angle. The 74.6% isodose line was prescribed. The 7.5 mm collimator was used. 6 MV X-rays were delivered in the flattening filter mode.  On 02/05/2013, he received stereotactic radiosurgery to the following targets:    Right occipital 3 mm target was treated using 2 Circular Arcs to a prescription dose of 20 Gy. ExacTrac registration was performed for each couch angle. The 73% isodose line was prescribed. The 7.5  mm collimator was used. 6 MV X-rays were delivered in the flattening filter mode.    Left Thalamus 3 mm target was treated using 2 Circular Arcs to a prescription dose of 20 Gy. ExacTrac registration was performed for each couch angle. The 84.7% isodose line was prescribed. The 7.5 mm collimator was used. 6 MV X-rays were delivered in the flattening filter mode.    Left Occipital 1 mm target was treated using 2 Circular Arcs to a prescription dose of 20 Gy. ExacTrac registration was performed for each couch angle. The 77.5% isodose line was prescribed. The 6 mm collimator was used. 6 MV X-rays were delivered in the flattening filter mode.  Narrative: The patient tolerated radiation treatment relatively well.   He had no acute complications related to his treatment.  Plan: The patient has completed radiation treatment. The patient will return to radiation oncology clinic for routine followup in one month. I advised them to call or return sooner if they have any questions or concerns related to their recovery or treatment. ________________________________  Artist Pais. Kathrynn Running, M.D.

## 2013-02-26 ENCOUNTER — Ambulatory Visit: Payer: 59 | Admitting: Radiation Oncology

## 2013-03-16 ENCOUNTER — Encounter: Payer: Self-pay | Admitting: Radiation Oncology

## 2013-03-16 NOTE — Progress Notes (Signed)
  Radiation Oncology         [phone]) [phone] ________________________________  Name: Terrance Clark MRN: [id number]  Date: 03/17/2013  DOB: 1976-01-08  Follow-Up Visit Note  CC: Default, Provider, MD  No ref. provider found  Diagnosis:   38 year old gentleman with stage T1 N3 M1 non-small cell lung cancer with brain metastases 1.  07/16/12-09/10/2012, Lower neck and upper chest region, 63 gray in 35 fractions 2.  07/18/2012 stereotactic radiosurgery for three brain metastasis (Right parietal 21 mm, Right posterior parietal 6 mm, Left thalamic 5 mm 3.  01/31/2013 and 02/05/2013 stereotactic radiosurgery to 7 new lesions (Right frontal 12 mm, Right parietal 5 mm, Left parietal 4 mm, Left insular 3 mm, Right occipital 3 mm, Left Thalamus 3 mm, Left Occipital 1 mm)  Interval Since Last Radiation:  6  weeks  Narrative:  The patient returns today for routine follow-up.  Denies pain at this time. Without complaints today. Denies headache, dizziness, nausea, or vomiting. Denise diplopia or ringing in the ears. Continues to work full time. Steady gait noted. Appropriate affect noted. Denies taking any medication at this time                              ALLERGIES:  has No Known Allergies.  Meds: No current outpatient prescriptions on file.   No current facility-administered medications for this encounter.    Physical Findings: The patient is in no acute distress. Patient is alert and oriented.  weight is 138 lb 8 oz (62.823 kg). His oral temperature is 98 F (36.7 C). His blood pressure is 113/77 and his pulse is 72. His respiration is 16 and oxygen saturation is 100%. .  Neurologically, grossly intact. No significant changes.  Lab Findings: Lab Results  Component Value Date   WBC 3.3* 11/04/2012   HGB 12.4* 11/04/2012   HCT 37.7* 11/04/2012   MCV 91.7 11/04/2012   PLT 114* 11/04/2012   Impression:  The patient is recovering from the effects of radiation.  Plan:  Brain MRI in 2 months and  followup.  He will likely need follow up with Dr. Julien Nordmann for restaging following completion of systemic chemotherapy.  _____________________________________  Sheral Apley. Tammi Klippel, M.D.

## 2013-03-17 ENCOUNTER — Encounter: Payer: Self-pay | Admitting: Radiation Oncology

## 2013-03-17 ENCOUNTER — Other Ambulatory Visit: Payer: Self-pay | Admitting: *Deleted

## 2013-03-17 ENCOUNTER — Ambulatory Visit
Admission: RE | Admit: 2013-03-17 | Discharge: 2013-03-17 | Disposition: A | Discharge status: Home / Self Care | Payer: 59 | Source: Ambulatory Visit | Attending: Radiation Oncology | Admitting: Radiation Oncology

## 2013-03-17 VITALS — BP 113/77 | HR 72 | Temp 98.0°F | Resp 16 | Wt 138.5 lb

## 2013-03-17 DIAGNOSIS — C3491 Malignant neoplasm of unspecified part of right bronchus or lung: Secondary | ICD-10-CM

## 2013-03-17 NOTE — Progress Notes (Signed)
Denies pain at this time. Without complaints today. Denies headache, dizziness, nausea, or vomiting. Denise diplopia or ringing in the ears. Continues to work full time. Steady gait noted. Appropriate affect noted. Denies taking any medication at this time.

## 2013-03-18 ENCOUNTER — Telehealth: Payer: Self-pay | Admitting: Internal Medicine

## 2013-03-18 ENCOUNTER — Telehealth: Payer: Self-pay | Admitting: *Deleted

## 2013-03-18 NOTE — Telephone Encounter (Signed)
called pt to give appt d/t's pt upset stating ins co questioning why he needs another CT and pt states he has trouble w barium and never gets any results from any tests he has here.  Sent email to Norton Blizzard requesting she call pt and discuss and advise if I need to do anything further. shh

## 2013-03-18 NOTE — Telephone Encounter (Signed)
Received in-basket from scheduler regarding pt concerns about CT scan.  I called left vm message for pt to call me with phone number and name

## 2013-03-21 ENCOUNTER — Encounter: Payer: Self-pay | Admitting: *Deleted

## 2013-03-21 ENCOUNTER — Telehealth: Payer: Self-pay | Admitting: *Deleted

## 2013-03-21 NOTE — Telephone Encounter (Signed)
Called left vm message for pt to call.  I received email from radiation therapy navigator stating pt had question about scans and treatment.  I left vm message to call with my name and phone number.

## 2013-03-24 ENCOUNTER — Other Ambulatory Visit: Payer: Self-pay | Admitting: Radiation Therapy

## 2013-03-24 DIAGNOSIS — C7949 Secondary malignant neoplasm of other parts of nervous system: Principal | ICD-10-CM

## 2013-03-24 DIAGNOSIS — C7931 Secondary malignant neoplasm of brain: Secondary | ICD-10-CM

## 2013-03-25 ENCOUNTER — Ambulatory Visit (HOSPITAL_COMMUNITY): Payer: 59

## 2013-03-25 ENCOUNTER — Other Ambulatory Visit: Payer: 59

## 2013-03-31 ENCOUNTER — Telehealth: Payer: Self-pay | Admitting: *Deleted

## 2013-03-31 ENCOUNTER — Telehealth: Payer: Self-pay | Admitting: Medical Oncology

## 2013-03-31 ENCOUNTER — Ambulatory Visit (HOSPITAL_COMMUNITY): Payer: 59

## 2013-03-31 NOTE — Telephone Encounter (Signed)
Called pt left vm message to call for an appt with Dr. Julien Nordmann.  Left my name and phone number

## 2013-03-31 NOTE — Telephone Encounter (Signed)
His mother said she will get a message to his brother to tell pt about appointment. I called pt brother and he will talk to pt.

## 2013-04-01 ENCOUNTER — Ambulatory Visit: Payer: 59 | Admitting: Internal Medicine

## 2013-04-01 ENCOUNTER — Other Ambulatory Visit: Payer: 59

## 2013-04-02 ENCOUNTER — Telehealth: Payer: Self-pay | Admitting: *Deleted

## 2013-04-02 NOTE — Telephone Encounter (Signed)
Called spoke with pt regarding appt with Dr. Julien Nordmann on 04/17/13 8:30 labs and 8:45 with Dr. Julien Nordmann.  He requested I call him back and leave a vm message regarding appt.  I did.

## 2013-04-08 ENCOUNTER — Ambulatory Visit: Payer: 59 | Admitting: Nutrition

## 2013-04-08 ENCOUNTER — Ambulatory Visit
Admission: RE | Admit: 2013-04-08 | Discharge: 2013-04-08 | Disposition: A | Discharge status: Home / Self Care | Payer: 59 | Source: Ambulatory Visit | Attending: Radiation Oncology | Admitting: Radiation Oncology

## 2013-04-08 DIAGNOSIS — C3491 Malignant neoplasm of unspecified part of right bronchus or lung: Secondary | ICD-10-CM

## 2013-04-08 DIAGNOSIS — C3410 Malignant neoplasm of upper lobe, unspecified bronchus or lung: Secondary | ICD-10-CM

## 2013-04-08 DIAGNOSIS — C778 Secondary and unspecified malignant neoplasm of lymph nodes of multiple regions: Secondary | ICD-10-CM

## 2013-04-08 LAB — COMPREHENSIVE METABOLIC PANEL (CC13)
ALBUMIN: 4.1 g/dL (ref 3.5–5.0)
ALK PHOS: 148 U/L (ref 40–150)
ALT: 15 U/L (ref 0–55)
AST: 16 U/L (ref 5–34)
Anion Gap: 10 mEq/L (ref 3–11)
BILIRUBIN TOTAL: 0.21 mg/dL (ref 0.20–1.20)
BUN: 9.6 mg/dL (ref 7.0–26.0)
CO2: 26 mEq/L (ref 22–29)
CREATININE: 0.8 mg/dL (ref 0.7–1.3)
Calcium: 9.5 mg/dL (ref 8.4–10.4)
Chloride: 104 mEq/L (ref 98–109)
GLUCOSE: 97 mg/dL (ref 70–140)
POTASSIUM: 4.1 meq/L (ref 3.5–5.1)
Sodium: 139 mEq/L (ref 136–145)
Total Protein: 7 g/dL (ref 6.4–8.3)

## 2013-04-08 LAB — CBC WITH DIFFERENTIAL/PLATELET
BASO%: 0.6 % (ref 0.0–2.0)
BASOS ABS: 0 10*3/uL (ref 0.0–0.1)
EOS ABS: 0.1 10*3/uL (ref 0.0–0.5)
EOS%: 1.6 % (ref 0.0–7.0)
HEMATOCRIT: 41.5 % (ref 38.4–49.9)
HEMOGLOBIN: 13.7 g/dL (ref 13.0–17.1)
LYMPH%: 18.6 % (ref 14.0–49.0)
MCH: 28.8 pg (ref 27.2–33.4)
MCHC: 32.9 g/dL (ref 32.0–36.0)
MCV: 87.6 fL (ref 79.3–98.0)
MONO#: 0.5 10*3/uL (ref 0.1–0.9)
MONO%: 12 % (ref 0.0–14.0)
NEUT%: 67.2 % (ref 39.0–75.0)
NEUTROS ABS: 2.7 10*3/uL (ref 1.5–6.5)
PLATELETS: 188 10*3/uL (ref 140–400)
RBC: 4.74 10*6/uL (ref 4.20–5.82)
RDW: 12.9 % (ref 11.0–14.6)
WBC: 4.1 10*3/uL (ref 4.0–10.3)
lymph#: 0.8 10*3/uL — ABNORMAL LOW (ref 0.9–3.3)

## 2013-04-08 NOTE — Progress Notes (Signed)
Patient stopped by my office this morning.  He states he feels well.  His weight is stable and was documented as 138.5 pounds.  He is requesting additional oral nutrition supplements.  He denies nausea, vomiting, or other nutrition side effects.  Nutrition diagnosis: Inadequate oral intake resolved.  Provided support and encouragement to patient to continue increased oral intake.  Provided patient with 2 cases of Replete, which is a donated nutrition supplements.  Patient to continue to contact me with questions or concerns.  No followup scheduled.

## 2013-04-09 ENCOUNTER — Ambulatory Visit (HOSPITAL_COMMUNITY): Payer: 59

## 2013-04-09 ENCOUNTER — Ambulatory Visit (HOSPITAL_COMMUNITY)
Admission: RE | Admit: 2013-04-09 | Discharge: 2013-04-09 | Disposition: A | Discharge status: Home / Self Care | Payer: 59 | Source: Ambulatory Visit | Attending: Internal Medicine | Admitting: Internal Medicine

## 2013-04-09 DIAGNOSIS — C3490 Malignant neoplasm of unspecified part of unspecified bronchus or lung: Secondary | ICD-10-CM | POA: Insufficient documentation

## 2013-04-09 DIAGNOSIS — C7931 Secondary malignant neoplasm of brain: Secondary | ICD-10-CM | POA: Insufficient documentation

## 2013-04-09 DIAGNOSIS — C7949 Secondary malignant neoplasm of other parts of nervous system: Secondary | ICD-10-CM

## 2013-04-09 DIAGNOSIS — R599 Enlarged lymph nodes, unspecified: Secondary | ICD-10-CM | POA: Insufficient documentation

## 2013-04-09 DIAGNOSIS — I872 Venous insufficiency (chronic) (peripheral): Secondary | ICD-10-CM | POA: Insufficient documentation

## 2013-04-09 DIAGNOSIS — R911 Solitary pulmonary nodule: Secondary | ICD-10-CM | POA: Insufficient documentation

## 2013-04-09 DIAGNOSIS — J438 Other emphysema: Secondary | ICD-10-CM | POA: Insufficient documentation

## 2013-04-09 DIAGNOSIS — C3491 Malignant neoplasm of unspecified part of right bronchus or lung: Secondary | ICD-10-CM

## 2013-04-09 MED ORDER — IOHEXOL 300 MG/ML  SOLN
100.0000 mL | Freq: Once | INTRAMUSCULAR | Status: AC | PRN
  Administered 2013-04-09: 100 mL via INTRAVENOUS

## 2013-04-10 ENCOUNTER — Encounter: Payer: Self-pay | Admitting: *Deleted

## 2013-04-10 NOTE — Progress Notes (Signed)
[place] Work  Patient briefly visited with [place] in support center area.  Mr. Ackert shared he recently completed [place] treatment and has many upcoming appointments for restaging.  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  CSW provided brief emotional support.  Patient requested to reapply for [employer] gas card grant, patient completed paperwork and CSW faxed to [place].  CSW will follow up as needed.   Polo Riley, MSW, LCSW, OSW-C Clinical Social Worker [facility] [phone]

## 2013-04-11 ENCOUNTER — Other Ambulatory Visit: Payer: 59

## 2013-04-14 ENCOUNTER — Ambulatory Visit: Payer: 59 | Admitting: Radiation Oncology

## 2013-04-14 ENCOUNTER — Ambulatory Visit: Payer: 59

## 2013-04-17 ENCOUNTER — Other Ambulatory Visit: Payer: Self-pay | Admitting: Medical Oncology

## 2013-04-17 ENCOUNTER — Encounter: Payer: Self-pay | Admitting: Internal Medicine

## 2013-04-17 ENCOUNTER — Telehealth: Payer: Self-pay | Admitting: Internal Medicine

## 2013-04-17 ENCOUNTER — Other Ambulatory Visit (HOSPITAL_BASED_OUTPATIENT_CLINIC_OR_DEPARTMENT_OTHER): Payer: 59

## 2013-04-17 ENCOUNTER — Ambulatory Visit (HOSPITAL_BASED_OUTPATIENT_CLINIC_OR_DEPARTMENT_OTHER): Payer: 59

## 2013-04-17 ENCOUNTER — Ambulatory Visit (HOSPITAL_BASED_OUTPATIENT_CLINIC_OR_DEPARTMENT_OTHER): Payer: 59 | Admitting: Internal Medicine

## 2013-04-17 VITALS — BP 111/71 | HR 98 | Temp 98.1°F | Resp 18 | Ht 71.0 in | Wt 139.4 lb

## 2013-04-17 DIAGNOSIS — C7931 Secondary malignant neoplasm of brain: Secondary | ICD-10-CM

## 2013-04-17 DIAGNOSIS — C3490 Malignant neoplasm of unspecified part of unspecified bronchus or lung: Secondary | ICD-10-CM

## 2013-04-17 DIAGNOSIS — C3491 Malignant neoplasm of unspecified part of right bronchus or lung: Secondary | ICD-10-CM

## 2013-04-17 DIAGNOSIS — C7949 Secondary malignant neoplasm of other parts of nervous system: Secondary | ICD-10-CM

## 2013-04-17 DIAGNOSIS — C3410 Malignant neoplasm of upper lobe, unspecified bronchus or lung: Secondary | ICD-10-CM

## 2013-04-17 DIAGNOSIS — C778 Secondary and unspecified malignant neoplasm of lymph nodes of multiple regions: Secondary | ICD-10-CM

## 2013-04-17 DIAGNOSIS — F17200 Nicotine dependence, unspecified, uncomplicated: Secondary | ICD-10-CM

## 2013-04-17 DIAGNOSIS — R599 Enlarged lymph nodes, unspecified: Secondary | ICD-10-CM

## 2013-04-17 LAB — CBC WITH DIFFERENTIAL/PLATELET
BASO%: 0.5 % (ref 0.0–2.0)
BASOS ABS: 0 10*3/uL (ref 0.0–0.1)
EOS ABS: 0.1 10*3/uL (ref 0.0–0.5)
EOS%: 1.3 % (ref 0.0–7.0)
HCT: 40.8 % (ref 38.4–49.9)
HEMOGLOBIN: 13.6 g/dL (ref 13.0–17.1)
LYMPH%: 16.6 % (ref 14.0–49.0)
MCH: 28.9 pg (ref 27.2–33.4)
MCHC: 33.3 g/dL (ref 32.0–36.0)
MCV: 86.8 fL (ref 79.3–98.0)
MONO#: 0.4 10*3/uL (ref 0.1–0.9)
MONO%: 7.6 % (ref 0.0–14.0)
NEUT%: 74 % (ref 39.0–75.0)
NEUTROS ABS: 4.3 10*3/uL (ref 1.5–6.5)
PLATELETS: 224 10*3/uL (ref 140–400)
RBC: 4.7 10*6/uL (ref 4.20–5.82)
RDW: 13.2 % (ref 11.0–14.6)
WBC: 5.7 10*3/uL (ref 4.0–10.3)
lymph#: 1 10*3/uL (ref 0.9–3.3)

## 2013-04-17 LAB — COMPREHENSIVE METABOLIC PANEL (CC13)
ALBUMIN: 4.3 g/dL (ref 3.5–5.0)
ALT: 15 U/L (ref 0–55)
ANION GAP: 11 meq/L (ref 3–11)
AST: 14 U/L (ref 5–34)
Alkaline Phosphatase: 143 U/L (ref 40–150)
BUN: 15.1 mg/dL (ref 7.0–26.0)
CO2: 24 meq/L (ref 22–29)
Calcium: 9.9 mg/dL (ref 8.4–10.4)
Chloride: 105 mEq/L (ref 98–109)
Creatinine: 0.9 mg/dL (ref 0.7–1.3)
GLUCOSE: 108 mg/dL (ref 70–140)
POTASSIUM: 3.9 meq/L (ref 3.5–5.1)
SODIUM: 140 meq/L (ref 136–145)
TOTAL PROTEIN: 7.3 g/dL (ref 6.4–8.3)

## 2013-04-17 MED ORDER — CYANOCOBALAMIN 1000 MCG/ML IJ SOLN
1000.0000 ug | Freq: Once | INTRAMUSCULAR | Status: AC
  Administered 2013-04-17: 1000 ug via INTRAMUSCULAR

## 2013-04-17 NOTE — Progress Notes (Signed)
[place] [phone]   [phone]  OFFICE PROGRESS NOTE  [name], MD [place] [id number]  DIAGNOSIS: Metastatic non-small cell lung cancer, adenocarcinoma with unknown status of the EGFR mutation or ALK gene translocation secondary to insufficient material, diagnosed in April 2014.   PRIOR THERAPY:  1) stereotactic radiotherapy under the care of Dr. Tammi Klippel completed on 07/18/2012.  2) palliative radiotherapy to the left upper lobe and mediastinal lymph nodes. Completed 09/10/2012.  3) Systemic chemotherapy with carboplatin for AUC of 5 and Alimta 500 mg/M2 giving every 3 weeks. First dose was given on 07/15/2012. Status post 6 cycles, with partial response. 4) stereotactic radiotherapy to the 7 new brain lesions under the care of Dr. Tammi Klippel on 01/31/2013 and 02/05/2013.  CURRENT THERAPY:  1) systemic chemotherapy with single agent Alimta 500 mg/M2 every 3 weeks. First dose on 04/23/2013.  CHEMOTHERAPY INTENT: Palliative  CURRENT # OF CHEMOTHERAPY CYCLES: 1  CURRENT ANTIEMETICS: Zofran, dexamethasone and Compazine  CURRENT SMOKING STATUS: Current smoker and I strongly advise him to quit smoking  ORAL CHEMOTHERAPY AND CONSENT: None  CURRENT BISPHOSPHONATES USE: None    PAIN MANAGEMENT: 0/10  NARCOTICS INDUCED CONSTIPATION: None  LIVING WILL AND CODE STATUS: Full code  INTERVAL HISTORY: Neema Fluegge 38 y.o. male returns to the clinic today for followup visit. He did not show for his routine follow up evaluation and visit since 11/04/2012. He completed 6 cycles of systemic chemotherapy with carboplatin and Alimta with partial response. In November of 2014, repeat MRI of the brain showed progressive brain metastases with 7 new brain lesions. He underwent stereotactic radiotherapy to the new brain lesions under the care of Dr. Tammi Klippel on 01/31/2013 and 02/05/2013. Repeat CT scan of the chest, abdomen and  pelvis performed on 04/09/2013 showed similar borderline thoracic adenopathy in addition to further response to therapy of the posterior right apical lung nodule with no well-defined residual nodular identified. There was scattered tiny upper lobe pulmonary nodules and many of these appear new since July of 2014 suspicious for early pulmonary metastases. The patient came today for evaluation and discussion of his treatment options. He is also scheduled for repeat MRI of the brain tomorrow for evaluation of his brain lesions. The patient is feeling fine today with no specific complaints.  He continues to work regularly. He denied having any pain. The patient denied having any significant chest pain, shortness of breath, cough or hemoptysis. He has occasional dizzy spells. No significant weight loss or night sweats.   MEDICAL HISTORY: Past Medical History  Diagnosis Date  . H/O oral surgery 04/2012  . Cough   . Shortness of breath     with exertion  . Insomnia     d/t pain and takes [employer] PM  . Lung cancer 06/2012    RUL lung  . Hx of radiation therapy 07/16/12- 09/10/12    lower neck and upper chest region , 63 gray in 35 fx  . 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 Hx of radiation therapy 07/18/12    SRS to brain metastasis    ALLERGIES:  has No Known Allergies.  MEDICATIONS:  No current outpatient prescriptions on file.   No current facility-administered medications for this visit.    SURGICAL HISTORY:  Past Surgical History  Procedure Laterality Date  . Foot surgery    . Mouth surgery    . Video bronchoscopy with endobronchial ultrasound Right 06/19/2012    Procedure:  VIDEO BRONCHOSCOPY WITH ENDOBRONCHIAL ULTRASOUND;  Surgeon: Collene Gobble, MD;  Location: [facility];  Service: Pulmonary;  Laterality: Right;    REVIEW OF SYSTEMS:  Constitutional: positive for fatigue Eyes: negative Ears, nose, mouth, throat, and face: negative Respiratory: negative Cardiovascular: negative Gastrointestinal:  negative Genitourinary:negative Integument/breast: negative Hematologic/lymphatic: negative Musculoskeletal:negative Neurological: negative Behavioral/Psych: negative Endocrine: negative Allergic/Immunologic: negative   PHYSICAL EXAMINATION: General appearance: alert, cooperative, fatigued and no distress Head: Normocephalic, without obvious abnormality, atraumatic Neck:  palppable left lower cervical and supraclavicular lymphadenopathy. Lymph nodes: palppable left lower cervical and supraclavicular lymphadenopathy. Resp: clear to auscultation bilaterally Back: symmetric, no curvature. ROM normal. No CVA tenderness. Cardio: regular rate and rhythm, S1, S2 normal, no murmur, click, rub or gallop GI: soft, non-tender; bowel sounds normal; no masses,  no organomegaly Extremities: extremities normal, atraumatic, no cyanosis or edema Neurologic: Alert and oriented X 3, normal strength and tone. Normal symmetric reflexes. Normal coordination and gait  ECOG PERFORMANCE STATUS: 1 - Symptomatic but completely ambulatory  Blood pressure 111/71, pulse 98, temperature 98.1 F (36.7 C), temperature source Oral, resp. rate 18, height 5' 11"  (1.803 m), weight 139 lb 6.4 oz (63.231 kg), SpO2 100.00%.  LABORATORY DATA: Lab Results  Component Value Date   WBC 5.7 04/17/2013   HGB 13.6 04/17/2013   HCT 40.8 04/17/2013   MCV 86.8 04/17/2013   PLT 224 04/17/2013      Chemistry      Component Value Date/Time   NA 139 04/08/2013 0923   NA 141 06/19/2012 0800   K 4.1 04/08/2013 0923   K 3.9 06/19/2012 0800   CL 103 09/02/2012 0955   CL 105 06/19/2012 0800   CO2 26 04/08/2013 0923   CO2 25 06/19/2012 0800   BUN 9.6 04/08/2013 0923   BUN 18 06/19/2012 0800   CREATININE 0.8 04/08/2013 0923   CREATININE 0.86 06/19/2012 0800      Component Value Date/Time   CALCIUM 9.5 04/08/2013 0923   CALCIUM 9.5 06/19/2012 0800   ALKPHOS 148 04/08/2013 0923   AST 16 04/08/2013 0923   ALT 15 04/08/2013 0923   BILITOT 0.21 04/08/2013  0923       RADIOGRAPHIC STUDIES: Ct Chest W Contrast  04/09/2013   CLINICAL DATA:  Non-small-cell lung cancer the right lung. Followup. Radiation therapy through July. Brain metastasis in May.  EXAM: CT CHEST, ABDOMEN, AND PELVIS WITH CONTRAST  TECHNIQUE: Multidetector CT imaging of the chest, abdomen and pelvis was performed following the standard protocol during bolus administration of intravenous contrast.  CONTRAST:  160m OMNIPAQUE IOHEXOL 300 MG/ML  SOLN  COMPARISON:  NM PET IMAGE RESTAG (PS) SKULL BASE TO THIGH dated 12/18/2012; CT CHEST W/CM dated 09/20/2012  FINDINGS: CT CHEST FINDINGS  Lungs/Pleura: Centrilobular emphysema. Resolution of posterior right upper lobe lung nodule with only mild scarring identified in this area, including on image 10/series 3. Progressive radiation fibrosis in the more anterior right apex also identified. Mild atelectasis at the left lung base.  Multiple upper lobe predominant pulmonary nodules. These are on the order of 2 mm. The majority of these appear new since the 09/2009 14 CT. Example today include on images 11, 18, 19, 21. More ill-defined nodules on the 09/20/2012 exam are improved to resolved. No lobar consolidation. No pleural fluid.  Heart/Mediastinum: Normal heart size, without pericardial effusion. 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 No central pulmonary embolism, on this  non-dedicated study. Persistent mediastinal soft tissue density. Right paratracheal component measures 1.0 cm on image 19/series 2 versus 1.4 cm on 09/20/2012. Similar to the 12/18/2012 exam. No hilar adenopathy.  CT ABDOMEN AND PELVIS FINDINGS  Abdomen/Pelvis: Altered perfusion about the anterior aspect of the liver secondary to presumed central venous insufficiency in the chest. Example its image 76/series 2.  Normal spleen, stomach, pancreas, gallbladder, biliary tract, adrenal  glands, kidneys. Early but age advanced aortic atherosclerosis. No retroperitoneal or retrocrural adenopathy. Normal colon, appendix, and terminal ileum. Normal small bowel without abdominal ascites.  No pelvic adenopathy. Normal urinary bladder and prostate. No significant free fluid.  Bones/Musculoskeletal: No acute osseous abnormality.  IMPRESSION: 1. Similar borderline thoracic adenopathy. 2. Further response to therapy of posterior right apical lung nodule. No well-defined residual nodule identified. 3. Scattered tiny upper lobe pulmonary nodules. Many of these appear new since 09/20/2012. Suspicious for early pulmonary metastasis. Given presence of more ill-defined nodules on the 09/20/2012 CT, infectious or inflammatory etiology is possible, but felt less likely. 4. No evidence of subdiaphragmatic disease. 5. Central venous insufficiency due to high SVC narrowing. No thrombosis.   Electronically Signed   By: Abigail Miyamoto M.D.   On: 04/09/2013 12:34     ASSESSMENT AND PLAN: This is a very pleasant 38 years old [place] American male with metastatic non-small cell lung cancer, adenocarcinoma with multiple and recurrent brain metastasis status post systemic chemotherapy with carboplatin and Alimta status post 6 cycles. He also underwent stereotactic radiotherapy to multiple brain lesions recently.  His recent CT scan of the chest, abdomen and pelvis showed scattered tiny pulmonary nodule suspicious for early disease progression in the chest. I discussed the scan results and showed the images to the patient today. He also has palpable lymphadenopathy in the left supraclavicular and lower cervical region. I will order CT of the neck for evaluation of these lymphadenopathy.  I gave him the option of continuous observation with repeat CT scan of the chest, abdomen and pelvis and 6-8 weeks for further evaluation of these lesions versus proceeding with systemic chemotherapy single agent Alimta since the patient  had good response previously to carboplatin and Alimta but he was lost to follow up and did not show up for discussion of maintenance treatment. I discussed with the patient adverse effect of the chemotherapy including mild alopecia, myelosuppression, nausea and vomiting, peripheral neuropathy, liver or renal dysfunction. The patient is interested in proceeding with the treatment. He'll receive vitamin B12 injection today. I will call his pharmacy with prescription for folic acid 1 mg by mouth daily in addition to Decadron 4 mg by mouth twice a day the day before, day of and day after the chemotherapy. Is expected to start the first cycle of this treatment on 04/23/2013.  For smoke cessation, I advised the patient to quit smoking and offered him to smoke cessation program. He would come back for follow up visit in one month's for reevaluation and management any adverse effect of his treatment. He was advised to call immediately if he has any concerning symptoms in the interval.  The patient voices understanding of current disease status and treatment options and is in agreement with the current care plan.  All questions were answered. The patient knows to call the clinic with any problems, questions or concerns. We can certainly see the patient much sooner if necessary.  Disclaimer: This note was dictated with voice recognition software. Similar sounding words can inadvertently be transcribed and may not be  corrected upon review. I spent 15 minutes of face-to-face counseling with the patient out of the total visit time 25 minutes.

## 2013-04-17 NOTE — Patient Instructions (Signed)
You Can Quit Smoking If you are ready to quit smoking or are thinking about it, congratulations! You have chosen to help yourself be healthier and live longer! There are lots of different ways to quit smoking. Nicotine gum, nicotine patches, a nicotine inhaler, or nicotine nasal spray can help with physical craving. Hypnosis, support groups, and medicines help break the habit of smoking. TIPS TO GET OFF AND STAY OFF CIGARETTES  Learn to predict your moods. Do not let a bad situation be your excuse to have a cigarette. Some situations in your life might tempt you to have a cigarette.  Ask friends and co-workers not to smoke around you.  Make your home smoke-free.  Never have "just one" cigarette. It leads to wanting another and another. Remind yourself of your decision to quit.  On a card, make a list of your reasons for not smoking. Read it at least the same number of times a day as you have a cigarette. Tell yourself everyday, "I do not want to smoke. I choose not to smoke."  Ask someone at home or work to help you with your plan to quit smoking.  Have something planned after you eat or have a cup of coffee. Take a walk or get other exercise to perk you up. This will help to keep you from overeating.  Try a relaxation exercise to calm you down and decrease your stress. Remember, you may be tense and nervous the first two weeks after you quit. This will pass.  Find new activities to keep your hands busy. Play with a pen, coin, or rubber band. Doodle or draw things on paper.  Brush your teeth right after eating. This will help cut down the craving for the taste of tobacco after meals. You can try mouthwash too.  Try gum, breath mints, or diet candy to keep something in your mouth. IF YOU SMOKE AND WANT TO QUIT:  Do not stock up on cigarettes. Never buy a carton. Wait until one pack is finished before you buy another.  Never carry cigarettes with you at work or at home.  Keep cigarettes  as far away from you as possible. Leave them with someone else.  Never carry matches or a lighter with you.  Ask yourself, "Do I need this cigarette or is this just a reflex?"  Bet with someone that you can quit. Put cigarette money in a piggy bank every morning. If you smoke, you give up the money. If you do not smoke, by the end of the week, you keep the money.  Keep trying. It takes 21 days to change a habit!  Talk to your doctor about using medicines to help you quit. These include nicotine replacement gum, lozenges, or skin patches. Document Released: 12/24/2008 Document Revised: 05/22/2011 Document Reviewed: 12/24/2008 Exit[employer] Patient Information 2014 [employer].  

## 2013-04-17 NOTE — Telephone Encounter (Signed)
gv and printed appt scehd and avs for pt for Feb adn March...sed added tx...Marland Kitchenper standing orders lab E3weeks.Marland KitchenMarland Kitchenpt wanted MD visit same day as tx.Marland Kitchen

## 2013-04-17 NOTE — Patient Instructions (Signed)
Cyanocobalamin, Vitamin B12 injection °What is this medicine? °CYANOCOBALAMIN (sye an oh koe BAL a min) is a man made form of vitamin B12. Vitamin B12 is used in the growth of healthy blood cells, nerve cells, and proteins in the body. It also helps with the metabolism of fats and carbohydrates. This medicine is used to treat people who can not absorb vitamin B12. °This medicine may be used for other purposes; ask your health care provider or pharmacist if you have questions. °COMMON BRAND NAME(S): Cyomin, LA-12 , Nutri-Twelve , Primabalt °What should I tell my health care provider before I take this medicine? °They need to know if you have any of these conditions: °-kidney disease °-Leber's disease °-megaloblastic anemia °-an unusual or allergic reaction to cyanocobalamin, cobalt, other medicines, foods, dyes, or preservatives °-pregnant or trying to get pregnant °-breast-feeding °How should I use this medicine? °This medicine is injected into a muscle or deeply under the skin. It is usually given by a health care professional in a clinic or doctor's office. However, your doctor may teach you how to inject yourself. Follow all instructions. °Talk to your pediatrician regarding the use of this medicine in children. Special care may be needed. °Overdosage: If you think you have taken too much of this medicine contact a poison control center or emergency room at once. °NOTE: This medicine is only for you. Do not share this medicine with others. °What if I miss a dose? °If you are given your dose at a clinic or doctor's office, call to reschedule your appointment. If you give your own injections and you miss a dose, take it as soon as you can. If it is almost time for your next dose, take only that dose. Do not take double or extra doses. °What may interact with this medicine? °-colchicine °-heavy alcohol intake °This list may not describe all possible interactions. Give your health care provider a list of all the  medicines, herbs, non-prescription drugs, or dietary supplements you use. Also tell them if you smoke, drink alcohol, or use illegal drugs. Some items may interact with your medicine. °What should I watch for while using this medicine? °Visit your doctor or health care professional regularly. You may need blood work done while you are taking this medicine. °You may need to follow a special diet. Talk to your doctor. Limit your alcohol intake and avoid smoking to get the best benefit. °What side effects may I notice from receiving this medicine? °Side effects that you should report to your doctor or health care professional as soon as possible: °-allergic reactions like skin rash, itching or hives, swelling of the face, lips, or tongue °-blue tint to skin °-chest tightness, pain °-difficulty breathing, wheezing °-dizziness °-red, swollen painful area on the leg °Side effects that usually do not require medical attention (report to your doctor or health care professional if they continue or are bothersome): °-diarrhea °-headache °This list may not describe all possible side effects. Call your doctor for medical advice about side effects. You may report side effects to FDA at 1-800-FDA-1088. °Where should I keep my medicine? °Keep out of the reach of children. °Store at room temperature between 15 and 30 degrees C (59 and 85 degrees F). Protect from light. Throw away any unused medicine after the expiration date. °NOTE: This sheet is a summary. It may not cover all possible information. If you have questions about this medicine, talk to your doctor, pharmacist, or health care provider. °© 2014, [employer]. (2007-06-10   22:10:20) ° °

## 2013-04-18 ENCOUNTER — Ambulatory Visit
Admission: RE | Admit: 2013-04-18 | Discharge: 2013-04-18 | Disposition: A | Discharge status: Home / Self Care | Payer: 59 | Source: Ambulatory Visit | Attending: Radiation Oncology | Admitting: Radiation Oncology

## 2013-04-18 ENCOUNTER — Telehealth: Payer: Self-pay | Admitting: Internal Medicine

## 2013-04-18 DIAGNOSIS — C7949 Secondary malignant neoplasm of other parts of nervous system: Principal | ICD-10-CM

## 2013-04-18 DIAGNOSIS — C7931 Secondary malignant neoplasm of brain: Secondary | ICD-10-CM

## 2013-04-18 MED ORDER — GADOBENATE DIMEGLUMINE 529 MG/ML IV SOLN
13.0000 mL | Freq: Once | INTRAVENOUS | Status: AC | PRN
  Administered 2013-04-18: 13 mL via INTRAVENOUS

## 2013-04-18 NOTE — Telephone Encounter (Signed)
s/w carrie in [facility] today re proceeding w/ct scan @ [facility]. per desk nurse/MM 2/5 ct not urgent and can go through pre auth process. [facility] will contact pt  w/appt. per pof sent 2/5 scheduled ct 2/6 @ [facility] w/mri. no other orders.

## 2013-04-20 ENCOUNTER — Encounter: Payer: Self-pay | Admitting: Radiation Oncology

## 2013-04-20 NOTE — Progress Notes (Signed)
Radiation Oncology         [phone]) [phone] ________________________________  Name: Terrance Clark MRN: [id number]  Date: 04/21/2013  DOB: 1975-11-05  Multidisciplinary Neuro Oncology Clinic Follow-Up Visit Note  CC: [name], MD  No ref. 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 07/16/12-09/10/2012, Lower neck and upper chest region, 63 gray in 35 fractions  2. 07/18/2012 stereotactic radiosurgery for three brain metastasis (Right parietal 21 mm, Right posterior parietal 6 mm, Left thalamic 5 mm  3. 01/31/2013 and 02/05/2013 stereotactic radiosurgery to 7 new lesions (Right frontal 12 mm, Right parietal 5 mm, Left parietal 4 mm, Left insular 3 mm, Right occipital 3 mm, Left Thalamus 3 mm, Left Occipital 1 mm)   Interval Since Last Radiation:  2  months  Narrative:  The patient returns today for routine follow-up with myself and Dr. Vertell Limber from neurosurgery.  The recent films were presented in our multidisciplinary conference with neuroradiology just prior to the clinic.  He is restarting Alimta Wednesday due to active extracranial disease.   ALLERGIES:  has No Known Allergies.  Meds: No current outpatient prescriptions on file.   No current facility-administered medications for this encounter.    Physical Findings: The patient is in no acute distress. Patient is alert and oriented.  weight is 140 lb 11.2 oz (63.821 kg). His oral temperature is 98 F (36.7 C). His blood pressure is 131/102 and his pulse is 81. His respiration is 16 and oxygen saturation is 100%. .  No significant changes.  Lab Findings: Lab Results  Component Value Date   WBC 5.7 04/17/2013   HGB 13.6 04/17/2013   HCT 40.8 04/17/2013   MCV 86.8 04/17/2013   PLT 224 04/17/2013    @LASTCHEM @  Radiographic Findings: Ct Chest W Contrast  04/09/2013   CLINICAL DATA:  Non-small-cell lung cancer the right lung. Followup. Radiation  therapy through July. Brain metastasis in May.  EXAM: CT CHEST, ABDOMEN, AND PELVIS WITH CONTRAST  TECHNIQUE: Multidetector CT imaging of the chest, abdomen and pelvis was performed following the standard protocol during bolus administration of intravenous contrast.  CONTRAST:  183mL OMNIPAQUE IOHEXOL 300 MG/ML  SOLN  COMPARISON:  NM PET IMAGE RESTAG (PS) SKULL BASE TO THIGH dated 12/18/2012; CT CHEST W/CM dated 09/20/2012  FINDINGS: CT CHEST FINDINGS  Lungs/Pleura: Centrilobular emphysema. Resolution of posterior right upper lobe lung nodule with only mild scarring identified in this area, including on image 10/series 3. Progressive radiation fibrosis in the more anterior right apex also identified. Mild atelectasis at the left lung base.  Multiple upper lobe predominant pulmonary nodules. These are on the order of 2 mm. The majority of these appear new since the 09/2009 14 CT. Example today include on images 11, 18, 19, 21. More ill-defined nodules on the 09/20/2012 exam are improved to resolved. No lobar consolidation. No pleural fluid.  Heart/Mediastinum: Normal heart size, without pericardial effusion. 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 No central pulmonary embolism, on this non-dedicated study. Persistent mediastinal soft tissue density. Right paratracheal component measures 1.0 cm on image 19/series 2 versus 1.4 cm on 09/20/2012. Similar to the 12/18/2012 exam. No hilar adenopathy.  CT ABDOMEN AND PELVIS FINDINGS  Abdomen/Pelvis: Altered perfusion about the anterior aspect of the liver secondary to presumed central venous insufficiency in the chest. Example its image 76/series  2.  Normal spleen, stomach, pancreas, gallbladder, biliary tract, adrenal glands, kidneys. Early but age advanced aortic atherosclerosis. No retroperitoneal or retrocrural adenopathy. Normal colon, appendix, and terminal  ileum. Normal small bowel without abdominal ascites.  No pelvic adenopathy. Normal urinary bladder and prostate. No significant free fluid.  Bones/Musculoskeletal: No acute osseous abnormality.  IMPRESSION: 1. Similar borderline thoracic adenopathy. 2. Further response to therapy of posterior right apical lung nodule. No well-defined residual nodule identified. 3. Scattered tiny upper lobe pulmonary nodules. Many of these appear new since 09/20/2012. Suspicious for early pulmonary metastasis. Given presence of more ill-defined nodules on the 09/20/2012 CT, infectious or inflammatory etiology is possible, but felt less likely. 4. No evidence of subdiaphragmatic disease. 5. Central venous insufficiency due to high SVC narrowing. No thrombosis.   Electronically Signed   By: Abigail Miyamoto M.D.   On: 04/09/2013 12:34   Mr Jeri Cos TF Contrast  04/18/2013   CLINICAL DATA:  S RS restaging.  EXAM: MRI HEAD WITHOUT AND WITH CONTRAST  TECHNIQUE: Multiplanar, multiecho pulse sequences of the brain and surrounding structures were obtained without and with intravenous contrast.  CONTRAST:  14mL MULTIHANCE GADOBENATE DIMEGLUMINE 529 MG/ML IV SOLN  COMPARISON:  01/21/2013 and multiple previous  FINDINGS: There is an enlarging metastasis at the cerebellar vermis measuring 4 mm axial image 32.  There is a new 2 mm metastasis in the left occipital lobe axial image 49.  There are 2 new 3 mm metastases in the left temporal lobe also both seen on image 49.  There is a new 1 mm metastasis in the medial left parietal lobe image 93.  Multiple other punctate metastases scattered throughout both cerebral hemispheres as previously described are the same or smaller.  A right frontal metastasis with some internal hemorrhage previously measured at 11 mm is smaller, measuring only 7 mm.  At the right parietal vertex, there is a enlargement of the previously seen peripherally enhancing lesion, with more surrounding vasogenic edema. Previously this  measured 16 x 13 mm. Today it measures 18 x 14 mm. Previously, there has been discussion if this could represent post treatment effect versus progression of tumor. I think this remains questionable.  No evidence of ischemic infarction, hydrocephalus or extra-axial fluid collection. No skull or skullbase metastasis is identified.  IMPRESSION: Five new metastatic lesions identified affecting the cerebellar vermis, the left occipital lobe, the left temporal lobe in the left parietal lobe.  Slight enlargement of a peripheral enhancing lesion at the medial right parietal vertex. More regional vasogenic edema. This lesion remains indeterminate for a viable tumor versus treatment effect at this point.  Multiple other previously described brain lesions are all unchanged or smaller.   Electronically Signed   By: Nelson Chimes M.D.   On: 04/18/2013 10:49   Ct Abdomen Pelvis W Contrast  04/09/2013   CLINICAL DATA:  Non-small-cell lung cancer the right lung. Followup. Radiation therapy through July. Brain metastasis in May.  EXAM: CT CHEST, ABDOMEN, AND PELVIS WITH CONTRAST  TECHNIQUE: Multidetector CT imaging of the chest, abdomen and pelvis was performed following the standard protocol during bolus administration of intravenous contrast.  CONTRAST:  140mL OMNIPAQUE IOHEXOL 300 MG/ML  SOLN  COMPARISON:  NM PET IMAGE RESTAG (PS) SKULL BASE TO THIGH dated 12/18/2012; CT CHEST W/CM dated 09/20/2012  FINDINGS: CT CHEST FINDINGS  Lungs/Pleura: Centrilobular emphysema. Resolution of posterior right upper lobe lung nodule with only mild scarring identified in this area, including on image 10/series 3. Progressive radiation  fibrosis in the more anterior right apex also identified. Mild atelectasis at the left lung base.  Multiple upper lobe predominant pulmonary nodules. These are on the order of 2 mm. The majority of these appear new since the 09/2009 14 CT. Example today include on images 11, 18, 19, 21. More ill-defined nodules on  the 09/20/2012 exam are improved to resolved. No lobar consolidation. No pleural fluid.  Heart/Mediastinum: Normal heart size, without pericardial effusion. 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 No central pulmonary embolism, on this non-dedicated study. Persistent mediastinal soft tissue density. Right paratracheal component measures 1.0 cm on image 19/series 2 versus 1.4 cm on 09/20/2012. Similar to the 12/18/2012 exam. No hilar adenopathy.  CT ABDOMEN AND PELVIS FINDINGS  Abdomen/Pelvis: Altered perfusion about the anterior aspect of the liver secondary to presumed central venous insufficiency in the chest. Example its image 76/series 2.  Normal spleen, stomach, pancreas, gallbladder, biliary tract, adrenal glands, kidneys. Early but age advanced aortic atherosclerosis. No retroperitoneal or retrocrural adenopathy. Normal colon, appendix, and terminal ileum. Normal small bowel without abdominal ascites.  No pelvic adenopathy. Normal urinary bladder and prostate. No significant free fluid.  Bones/Musculoskeletal: No acute osseous abnormality.  IMPRESSION: 1. Similar borderline thoracic adenopathy. 2. Further response to therapy of posterior right apical lung nodule. No well-defined residual nodule identified. 3. Scattered tiny upper lobe pulmonary nodules. Many of these appear new since 09/20/2012. Suspicious for early pulmonary metastasis. Given presence of more ill-defined nodules on the 09/20/2012 CT, infectious or inflammatory etiology is possible, but felt less likely. 4. No evidence of subdiaphragmatic disease. 5. Central venous insufficiency due to high SVC narrowing. No thrombosis.   Electronically Signed   By: Abigail Miyamoto M.D.   On: 04/09/2013 12:34    Impression:  The patient has 5 new small brain metastases amenable to [place], whole brain RT, or even observation on Alimta.  Plan:  We  discussed the options above.  The patient would like to proceed with salvage SRS to his 5 new metastases.  There is no need to delay or move planned chemo.  _____________________________________  Sheral Apley. Tammi Klippel, M.D.

## 2013-04-21 ENCOUNTER — Ambulatory Visit (HOSPITAL_BASED_OUTPATIENT_CLINIC_OR_DEPARTMENT_OTHER)
Admission: RE | Admit: 2013-04-21 | Discharge: 2013-04-21 | Disposition: A | Discharge status: Home / Self Care | Payer: 59 | Source: Ambulatory Visit | Attending: Neurosurgery | Admitting: Neurosurgery

## 2013-04-21 ENCOUNTER — Ambulatory Visit
Admission: RE | Admit: 2013-04-21 | Discharge: 2013-04-21 | Disposition: A | Discharge status: Home / Self Care | Payer: 59 | Source: Ambulatory Visit | Attending: Radiation Oncology | Admitting: Radiation Oncology

## 2013-04-21 ENCOUNTER — Encounter: Payer: Self-pay | Admitting: Radiation Oncology

## 2013-04-21 ENCOUNTER — Other Ambulatory Visit: Payer: Self-pay | Admitting: *Deleted

## 2013-04-21 VITALS — BP 131/102 | HR 81 | Temp 98.0°F | Resp 16 | Wt 140.7 lb

## 2013-04-21 DIAGNOSIS — C3490 Malignant neoplasm of unspecified part of unspecified bronchus or lung: Secondary | ICD-10-CM

## 2013-04-21 DIAGNOSIS — C7931 Secondary malignant neoplasm of brain: Secondary | ICD-10-CM

## 2013-04-21 DIAGNOSIS — C7949 Secondary malignant neoplasm of other parts of nervous system: Principal | ICD-10-CM

## 2013-04-21 MED ORDER — DEXAMETHASONE 4 MG PO TABS: 4.0000 mg | ORAL_TABLET | ORAL | Status: DC

## 2013-04-21 MED ORDER — FOLIC ACID 1 MG PO TABS: 1.0000 mg | ORAL_TABLET | Freq: Every day | ORAL | Status: DC

## 2013-04-21 MED ORDER — PROCHLORPERAZINE MALEATE 10 MG PO TABS: 10.0000 mg | ORAL_TABLET | Freq: Four times a day (QID) | ORAL | Status: DC | PRN

## 2013-04-21 NOTE — Progress Notes (Signed)
Reports dizziness upon standing x 1 month. Denies headache, nausea, or vomiting. Weight stable. Denies diplopia or ringing in the ears. Reports left neck pain 6 on a scale of 0-10. Denies taking any medication to relieve this pain. Nodularity felt left neck at site of pain. Reports chemotherapy starts Wednesday.

## 2013-04-22 ENCOUNTER — Other Ambulatory Visit: Payer: Self-pay | Admitting: *Deleted

## 2013-04-22 ENCOUNTER — Telehealth: Payer: Self-pay | Admitting: *Deleted

## 2013-04-22 NOTE — Telephone Encounter (Signed)
Called left vm message regarding prescription.  I ask if pt has received medications and why pt needed to take.  Also, left my name and phone number with questions or concerns.

## 2013-04-23 ENCOUNTER — Other Ambulatory Visit: Payer: 59

## 2013-04-23 ENCOUNTER — Ambulatory Visit (HOSPITAL_BASED_OUTPATIENT_CLINIC_OR_DEPARTMENT_OTHER): Payer: 59

## 2013-04-23 VITALS — BP 111/61 | HR 79 | Temp 98.3°F | Resp 18

## 2013-04-23 DIAGNOSIS — C778 Secondary and unspecified malignant neoplasm of lymph nodes of multiple regions: Secondary | ICD-10-CM

## 2013-04-23 DIAGNOSIS — C3410 Malignant neoplasm of upper lobe, unspecified bronchus or lung: Secondary | ICD-10-CM

## 2013-04-23 DIAGNOSIS — C3490 Malignant neoplasm of unspecified part of unspecified bronchus or lung: Secondary | ICD-10-CM

## 2013-04-23 DIAGNOSIS — Z5111 Encounter for antineoplastic chemotherapy: Secondary | ICD-10-CM

## 2013-04-23 LAB — COMPREHENSIVE METABOLIC PANEL (CC13)
ALBUMIN: 3.8 g/dL (ref 3.5–5.0)
ALT: 10 U/L (ref 0–55)
AST: 17 U/L (ref 5–34)
Alkaline Phosphatase: 141 U/L (ref 40–150)
Anion Gap: 11 mEq/L (ref 3–11)
BUN: 15.6 mg/dL (ref 7.0–26.0)
CALCIUM: 9.1 mg/dL (ref 8.4–10.4)
CHLORIDE: 106 meq/L (ref 98–109)
CO2: 22 mEq/L (ref 22–29)
Creatinine: 1 mg/dL (ref 0.7–1.3)
GLUCOSE: 96 mg/dL (ref 70–140)
Potassium: 4.4 mEq/L (ref 3.5–5.1)
Sodium: 139 mEq/L (ref 136–145)
Total Bilirubin: 0.74 mg/dL (ref 0.20–1.20)
Total Protein: 6.6 g/dL (ref 6.4–8.3)

## 2013-04-23 LAB — CBC WITH DIFFERENTIAL/PLATELET
BASO%: 0.4 % (ref 0.0–2.0)
BASOS ABS: 0 10*3/uL (ref 0.0–0.1)
EOS ABS: 0.1 10*3/uL (ref 0.0–0.5)
EOS%: 1.4 % (ref 0.0–7.0)
HCT: 37.5 % — ABNORMAL LOW (ref 38.4–49.9)
HGB: 12.5 g/dL — ABNORMAL LOW (ref 13.0–17.1)
LYMPH#: 0.8 10*3/uL — AB (ref 0.9–3.3)
LYMPH%: 13.3 % — ABNORMAL LOW (ref 14.0–49.0)
MCH: 28.3 pg (ref 27.2–33.4)
MCHC: 33.3 g/dL (ref 32.0–36.0)
MCV: 84.8 fL (ref 79.3–98.0)
MONO#: 0.5 10*3/uL (ref 0.1–0.9)
MONO%: 9 % (ref 0.0–14.0)
NEUT%: 75.9 % — ABNORMAL HIGH (ref 39.0–75.0)
NEUTROS ABS: 4.3 10*3/uL (ref 1.5–6.5)
Platelets: 210 10*3/uL (ref 140–400)
RBC: 4.42 10*6/uL (ref 4.20–5.82)
RDW: 13.5 % (ref 11.0–14.6)
WBC: 5.6 10*3/uL (ref 4.0–10.3)

## 2013-04-23 MED ORDER — ONDANSETRON 8 MG/NS 50 ML IVPB
INTRAVENOUS | Status: AC
  Filled 2013-04-23: qty 8

## 2013-04-23 MED ORDER — SODIUM CHLORIDE 0.9 % IV SOLN
500.0000 mg/m2 | Freq: Once | INTRAVENOUS | Status: AC
  Administered 2013-04-23: 900 mg via INTRAVENOUS
  Filled 2013-04-23: qty 36

## 2013-04-23 MED ORDER — SODIUM CHLORIDE 0.9 % IV SOLN
Freq: Once | INTRAVENOUS | Status: AC
Start: 2013-04-23 — End: 2013-04-23
  Administered 2013-04-23: 09:00:00 via INTRAVENOUS

## 2013-04-23 MED ORDER — DEXAMETHASONE SODIUM PHOSPHATE 10 MG/ML IJ SOLN
INTRAMUSCULAR | Status: AC
  Filled 2013-04-23: qty 1

## 2013-04-23 MED ORDER — DEXAMETHASONE SODIUM PHOSPHATE 10 MG/ML IJ SOLN
10.0000 mg | Freq: Once | INTRAMUSCULAR | Status: AC
  Administered 2013-04-23: 10 mg via INTRAVENOUS

## 2013-04-23 MED ORDER — ONDANSETRON 8 MG/50ML IVPB (CHCC)
8.0000 mg | Freq: Once | INTRAVENOUS | Status: AC
  Administered 2013-04-23: 8 mg via INTRAVENOUS

## 2013-04-23 NOTE — Patient Instructions (Signed)
[facility] Discharge Instructions for Patients Receiving Chemotherapy  Today you received the following chemotherapy agents: Alimta  To help prevent nausea and vomiting after your treatment, we encourage you to take your nausea medication as prescribed by your physician   If you develop nausea and vomiting that is not controlled by your nausea medication, call the clinic.   BELOW ARE SYMPTOMS THAT SHOULD BE REPORTED IMMEDIATELY:  *FEVER GREATER THAN 100.5 F  *CHILLS WITH OR WITHOUT FEVER  NAUSEA AND VOMITING THAT IS NOT CONTROLLED WITH YOUR NAUSEA MEDICATION  *UNUSUAL SHORTNESS OF BREATH  *UNUSUAL BRUISING OR BLEEDING  TENDERNESS IN MOUTH AND THROAT WITH OR WITHOUT PRESENCE OF ULCERS  *URINARY PROBLEMS  *BOWEL PROBLEMS  UNUSUAL RASH Items with * indicate a potential emergency and should be followed up as soon as possible.  Feel free to call the clinic you have any questions or concerns. The clinic phone number is [phone].

## 2013-04-24 ENCOUNTER — Ambulatory Visit
Admission: RE | Admit: 2013-04-24 | Discharge: 2013-04-24 | Disposition: A | Discharge status: Home / Self Care | Payer: 59 | Source: Ambulatory Visit | Attending: Radiation Oncology | Admitting: Radiation Oncology

## 2013-04-24 ENCOUNTER — Encounter: Payer: Self-pay | Admitting: Radiation Oncology

## 2013-04-24 VITALS — BP 128/82 | HR 87 | Resp 16

## 2013-04-24 DIAGNOSIS — C7931 Secondary malignant neoplasm of brain: Secondary | ICD-10-CM

## 2013-04-24 DIAGNOSIS — C7949 Secondary malignant neoplasm of other parts of nervous system: Secondary | ICD-10-CM

## 2013-04-24 DIAGNOSIS — Z510 Encounter for antineoplastic radiation therapy: Secondary | ICD-10-CM | POA: Insufficient documentation

## 2013-04-24 DIAGNOSIS — C3490 Malignant neoplasm of unspecified part of unspecified bronchus or lung: Secondary | ICD-10-CM

## 2013-04-24 NOTE — Progress Notes (Signed)
  Radiation Oncology         [phone] ________________________________  Name: Terrance Clark  MRN: [id number]  Date: 04/24/2013  DOB: [birth year]  SIMULATION AND TREATMENT PLANNING NOTE  DIAGNOSIS:  38 yo man with 5 new brain metastases from metastatic lung cancer  NARRATIVE:  The patient was brought to the [facility].  Identity was confirmed.  All relevant records and images related to the planned course of therapy were reviewed.  The patient freely provided informed written consent to proceed with treatment after reviewing the details related to the planned course of therapy. The consent form was witnessed and verified by the simulation staff. Intravenous access was established for contrast administration. Then, the patient was set-up in a stable reproducible supine position for radiation therapy.  A relocatable thermoplastic stereotactic head frame was fabricated for precise immobilization.  CT images were obtained.  Surface markings were placed.  The CT images were loaded into the planning software and fused with the patient's targeting MRI scan.  Then the target and avoidance structures were contoured.  Treatment planning then occurred.  The radiation prescription was entered and confirmed.  I have requested 3D planning  I have requested a DVH of the following structures: Brain stem, brain, left eye, right I, lenses, optic chiasm, target volumes, uninvolved brain, and normal tissue.    PLAN:  The patient will receive 20 Gy in 1 fraction to five targets.  ________________________________  Sheral Apley Tammi Klippel, M.D.

## 2013-04-25 MED ORDER — SODIUM CHLORIDE 0.9 % IJ SOLN
10.0000 mL | Freq: Once | INTRAMUSCULAR | Status: AC
  Administered 2013-04-25: 10 mL via INTRAVENOUS

## 2013-04-25 NOTE — Progress Notes (Signed)
Patient presented to the nursing clinic for IV start necessary for CT/Simulation. BUN 15.6 and creatinine 1.0. NKDA. Patient is not a diabetic. Vitals WDL. Started left forearm 22 gauge IV. Excellent blood return. Patient tolerated well. Escorted patient to CT for simulation.

## 2013-04-29 NOTE — Progress Notes (Addendum)
  Radiation Oncology         [phone] ________________________________  Stereotactic Treatment Procedure Note  Name: Terrance Clark  MRN: [id number]  Date: 04/30/2013  DOB: [birth year]  SPECIAL TREATMENT PROCEDURE  3D TREATMENT PLANNING AND DOSIMETRY:  The patient's radiation plan was reviewed and approved by neurosurgery and radiation oncology prior to treatment.  It showed 3-dimensional radiation distributions overlaid onto the planning CT/MRI image set.  The [facility] for the target structures as well as the organs at risk were reviewed. The documentation of the 3D plan and dosimetry are filed in the radiation oncology EMR.  NARRATIVE:  Terrance Clark was brought to the TrueBeam stereotactic radiation treatment machine and placed supine on the CT couch. The head frame was applied, and the patient was set up for stereotactic radiosurgery.  Neurosurgery was present for the set-up and delivery  SIMULATION VERIFICATION:  In the couch zero-angle position, the patient underwent Exactrac imaging using the Brainlab system with orthogonal KV images.  These were carefully aligned and repeated to confirm treatment position for each of the isocenters.  The Exactrac snap film verification was repeated at each couch angle.  SPECIAL TREATMENT PROCEDURE: Terrance Clark received stereotactic radiosurgery to the following targets: Cerebellar vermis 4 mm target was treated using 4 Dynamic Conformal Arcs to a prescription dose of 20 Gy.  ExacTrac registration was performed for each couch angle.  The 82% isodose line was prescribed. The first Left temporal 3 mm target was treated using 3 Circular Arcs with the 6 mm collimators to a prescription dose of 20 Gy.  ExacTrac registration was performed for each couch angle.  The 69.4% isodose line was prescribed. The second Left temporal 3 mm target was treated using 3 Dynamic Conformal Arcs to a prescription dose of 20 Gy.  ExacTrac registration was performed for each couch  angle.  The 83.3% isodose line was prescribed. Left Occipital 2 mm target was treated using 3 Circular Arcs with the 6 mm collimators to a prescription dose of 20 Gy.  ExacTrac registration was performed for each couch angle.  The 80% isodose line was prescribed. Left medial parietal 1 mm target was treated using 3 Circular Arcs with the 6 mm collimators to a prescription dose of 20 Gy.  ExacTrac registration was performed for each couch angle.  The 83.3% isodose line was prescribed.  STEREOTACTIC TREATMENT MANAGEMENT:  Following delivery, the patient was transported to nursing in stable condition and monitored for possible acute effects.  Vital signs were recorded BP 134/85  Pulse 90  Temp(Src) 98.5 F (36.9 C)  SpO2 99%. The patient tolerated treatment without significant acute effects, and was discharged to home in stable condition.    PLAN: Follow-up in one month.  ________________________________  Sheral Apley. Tammi Klippel, M.D.

## 2013-04-30 ENCOUNTER — Ambulatory Visit
Admission: RE | Admit: 2013-04-30 | Discharge: 2013-04-30 | Disposition: A | Discharge status: Home / Self Care | Payer: 59 | Source: Ambulatory Visit | Attending: Radiation Oncology | Admitting: Radiation Oncology

## 2013-04-30 ENCOUNTER — Encounter: Payer: Self-pay | Admitting: Radiation Oncology

## 2013-04-30 VITALS — BP 134/85 | HR 90 | Temp 98.5°F

## 2013-04-30 DIAGNOSIS — C7949 Secondary malignant neoplasm of other parts of nervous system: Principal | ICD-10-CM

## 2013-04-30 DIAGNOSIS — C7931 Secondary malignant neoplasm of brain: Secondary | ICD-10-CM

## 2013-04-30 NOTE — Progress Notes (Signed)
Patient here for [facility] brain.Denies headache or nausea.Has increased fatigue.Patient is working over 40 hours/week at times."I've had to pull myself up the stairs sometimes."Not taking prescribed dexamethasone as states it makes him feel bad.Told him if he has unrelieved headache that he needs to take the steroid.

## 2013-04-30 NOTE — Op Note (Signed)
  Name: Terrance Clark  MRN: [id number]  Date: 04/30/2013   DOB: [birth year]  Stereotactic Radiosurgery Operative Note  PRE-OPERATIVE DIAGNOSIS:  Multiple Brain Metastases  POST-OPERATIVE DIAGNOSIS:  Multiple Brain Metastases  PROCEDURE:  Stereotactic Radiosurgery  SURGEON:  Peggyann Shoals, MD  NARRATIVE: The patient underwent a radiation treatment planning session in the radiation oncology simulation suite under the care of the radiation oncology physician and physicist.  I participated closely in the radiation treatment planning afterwards. The patient underwent planning CT which was fused to 3T high resolution MRI with 1 mm axial slices.  These images were fused on the planning system.  We contoured the gross target volumes and subsequently expanded this to yield the Planning Target Volume. I actively participated in the planning process.  I helped to define and review the target contours and also the contours of the optic pathway, eyes, brainstem and selected nearby organs at risk.  All the dose constraints for critical structures were reviewed and compared to AAPM Task Group 101.  The prescription dose conformity was reviewed.  I approved the plan electronically.    Accordingly, Terrance Clark was brought to the TrueBeam stereotactic radiation treatment linac and placed in the custom immobilization mask.  The patient was aligned according to the IR fiducial markers with BrainLab Exactrac, then orthogonal x-rays were used in ExacTrac with the 6DOF robotic table and the shifts were made to align the patient  Terrance Clark received stereotactic radiosurgery uneventfully.    Lesions treated:  5   Complex lesions treated:  0 (>3.5 cm, <87mm of optic path, or within the brainstem)   The detailed description of the procedure is recorded in the radiation oncology procedure note.  I was present for the duration of the procedure.  DISPOSITION:  Following delivery, the patient was transported to nursing in  stable condition and monitored for possible acute effects to be discharged to home in stable condition with follow-up in one month.  Peggyann Shoals, MD 04/30/2013 8:43 AM

## 2013-05-07 ENCOUNTER — Encounter (HOSPITAL_COMMUNITY): Payer: Self-pay

## 2013-05-07 ENCOUNTER — Ambulatory Visit (HOSPITAL_COMMUNITY)
Admission: RE | Admit: 2013-05-07 | Discharge: 2013-05-07 | Disposition: A | Discharge status: Home / Self Care | Payer: 59 | Source: Ambulatory Visit | Attending: Internal Medicine | Admitting: Internal Medicine

## 2013-05-07 DIAGNOSIS — J3489 Other specified disorders of nose and nasal sinuses: Secondary | ICD-10-CM | POA: Insufficient documentation

## 2013-05-07 DIAGNOSIS — R599 Enlarged lymph nodes, unspecified: Secondary | ICD-10-CM | POA: Insufficient documentation

## 2013-05-07 DIAGNOSIS — Z9221 Personal history of antineoplastic chemotherapy: Secondary | ICD-10-CM | POA: Insufficient documentation

## 2013-05-07 DIAGNOSIS — Z923 Personal history of irradiation: Secondary | ICD-10-CM | POA: Insufficient documentation

## 2013-05-07 DIAGNOSIS — M948X9 Other specified disorders of cartilage, unspecified sites: Secondary | ICD-10-CM | POA: Insufficient documentation

## 2013-05-07 DIAGNOSIS — C3490 Malignant neoplasm of unspecified part of unspecified bronchus or lung: Secondary | ICD-10-CM

## 2013-05-07 DIAGNOSIS — Z85118 Personal history of other malignant neoplasm of bronchus and lung: Secondary | ICD-10-CM | POA: Insufficient documentation

## 2013-05-07 MED ORDER — IOHEXOL 300 MG/ML  SOLN
100.0000 mL | Freq: Once | INTRAMUSCULAR | Status: AC | PRN
  Administered 2013-05-07: 100 mL via INTRAVENOUS

## 2013-05-11 NOTE — Progress Notes (Signed)
  Radiation Oncology         [phone]) [phone] ________________________________  Name: Terrance Clark MRN: [id number]  Date: 04/30/2013  DOB: 04/16/1975  End of Treatment Note  Diagnosis:   38 yo man with 5 new brain metastases from metastatic lung cancer  Indication for treatment:  palliation       Radiation treatment dates:   04/30/2013  Site/dose:   [name] received stereotactic radiosurgery to the following targets:   Cerebellar vermis 4 mm target was treated using 4 Dynamic Conformal Arcs to a prescription dose of 20 Gy. ExacTrac registration was performed for each couch angle. The 82% isodose line was prescribed.   The first Left temporal 3 mm target was treated using 3 Circular Arcs with the 6 mm collimators to a prescription dose of 20 Gy. ExacTrac registration was performed for each couch angle. The 69.4% isodose line was prescribed.   The second Left temporal 3 mm target was treated using 3 Dynamic Conformal Arcs to a prescription dose of 20 Gy. ExacTrac registration was performed for each couch angle. The 83.3% isodose line was prescribed.   Left Occipital 2 mm target was treated using 3 Circular Arcs with the 6 mm collimators to a prescription dose of 20 Gy. ExacTrac registration was performed for each couch angle. The 80% isodose line was prescribed.   Left medial parietal 1 mm target was treated using 3 Circular Arcs with the 6 mm collimators to a prescription dose of 20 Gy. ExacTrac registration was performed for each couch angle. The 83.3% isodose line was prescribed.  Beams/energy:   6 MV X-rays were delivered in the flattening filter free beam mode.  Narrative: The patient tolerated radiation treatment relatively well.   Plan: The patient has completed radiation treatment. The patient will return to radiation oncology clinic for routine followup in one month. I advised him to call or return sooner if he has any questions or concerns related to his recovery or  treatment. ________________________________  Sheral Apley. Tammi Klippel, M.D.

## 2013-05-14 ENCOUNTER — Telehealth: Payer: Self-pay | Admitting: Internal Medicine

## 2013-05-14 ENCOUNTER — Ambulatory Visit: Payer: 59

## 2013-05-14 ENCOUNTER — Other Ambulatory Visit (HOSPITAL_BASED_OUTPATIENT_CLINIC_OR_DEPARTMENT_OTHER): Payer: 59

## 2013-05-14 ENCOUNTER — Ambulatory Visit (HOSPITAL_BASED_OUTPATIENT_CLINIC_OR_DEPARTMENT_OTHER): Payer: 59 | Admitting: Internal Medicine

## 2013-05-14 ENCOUNTER — Encounter: Payer: Self-pay | Admitting: Internal Medicine

## 2013-05-14 VITALS — BP 110/60 | HR 80 | Temp 98.4°F | Resp 18 | Ht 71.0 in | Wt 141.8 lb

## 2013-05-14 DIAGNOSIS — C7931 Secondary malignant neoplasm of brain: Secondary | ICD-10-CM

## 2013-05-14 DIAGNOSIS — C7949 Secondary malignant neoplasm of other parts of nervous system: Secondary | ICD-10-CM

## 2013-05-14 DIAGNOSIS — F17200 Nicotine dependence, unspecified, uncomplicated: Secondary | ICD-10-CM

## 2013-05-14 DIAGNOSIS — C3490 Malignant neoplasm of unspecified part of unspecified bronchus or lung: Secondary | ICD-10-CM

## 2013-05-14 LAB — CBC WITH DIFFERENTIAL/PLATELET
BASO%: 0.2 % (ref 0.0–2.0)
Basophils Absolute: 0 10*3/uL (ref 0.0–0.1)
EOS ABS: 0.1 10*3/uL (ref 0.0–0.5)
EOS%: 2.1 % (ref 0.0–7.0)
HCT: 37.1 % — ABNORMAL LOW (ref 38.4–49.9)
HGB: 12.4 g/dL — ABNORMAL LOW (ref 13.0–17.1)
LYMPH%: 20.3 % (ref 14.0–49.0)
MCH: 28.4 pg (ref 27.2–33.4)
MCHC: 33.4 g/dL (ref 32.0–36.0)
MCV: 85.1 fL (ref 79.3–98.0)
MONO#: 0.5 10*3/uL (ref 0.1–0.9)
MONO%: 9.3 % (ref 0.0–14.0)
NEUT%: 68.1 % (ref 39.0–75.0)
NEUTROS ABS: 3.3 10*3/uL (ref 1.5–6.5)
Platelets: 238 10*3/uL (ref 140–400)
RBC: 4.36 10*6/uL (ref 4.20–5.82)
RDW: 14.4 % (ref 11.0–14.6)
WBC: 4.8 10*3/uL (ref 4.0–10.3)
lymph#: 1 10*3/uL (ref 0.9–3.3)

## 2013-05-14 MED ORDER — DEXAMETHASONE SODIUM PHOSPHATE 10 MG/ML IJ SOLN
INTRAMUSCULAR | Status: AC
  Filled 2013-05-14: qty 1

## 2013-05-14 MED ORDER — ONDANSETRON 8 MG/NS 50 ML IVPB
INTRAVENOUS | Status: AC
  Filled 2013-05-14: qty 8

## 2013-05-14 NOTE — Progress Notes (Signed)
Pt arrived for chemo. Requested staff member to go to cafeteria and buy food for him. RN and NT offered pt food from patient food supply on this unit. Pt refused and stated he "always" had someone that would go buy food for him. RN rechecked policy with charge nurse and reinformed patient that staff is unable to go to cafeteria for patients. Again offered patient food from patient food supply on this unit. Pt refused and left treatment area. Film/video editor notified.

## 2013-05-14 NOTE — Progress Notes (Signed)
[place] Telephone:(336) [phone]   Fax:(336) [phone]  OFFICE PROGRESS NOTE  [name], MD [place] [id number]  DIAGNOSIS: Metastatic non-small cell lung cancer, adenocarcinoma with unknown status of the EGFR mutation or ALK gene translocation secondary to insufficient material, diagnosed in April 2014.   PRIOR THERAPY:  1) stereotactic radiotherapy under the care of Dr. Tammi Klippel completed on 07/18/2012.  2) palliative radiotherapy to the left upper lobe and mediastinal lymph nodes. Completed 09/10/2012.  3) Systemic chemotherapy with carboplatin for AUC of 5 and Alimta 500 mg/M2 giving every 3 weeks. First dose was given on 07/15/2012. Status post 6 cycles, with partial response. 4) stereotactic radiotherapy to the 7 new brain lesions under the care of Dr. Tammi Klippel on 01/31/2013 and 02/05/2013.  CURRENT THERAPY:  1) systemic chemotherapy with single agent Alimta 500 mg/M2 every 3 weeks. First dose on 04/23/2013.  CHEMOTHERAPY INTENT: Palliative  CURRENT # OF CHEMOTHERAPY CYCLES: 1  CURRENT ANTIEMETICS: Zofran, dexamethasone and Compazine  CURRENT SMOKING STATUS: Current smoker and I strongly advise him to quit smoking  ORAL CHEMOTHERAPY AND CONSENT: None  CURRENT BISPHOSPHONATES USE: None    PAIN MANAGEMENT: 0/10  NARCOTICS INDUCED CONSTIPATION: None  LIVING WILL AND CODE STATUS: Full code  INTERVAL HISTORY: Terrance Clark 38 y.o. male returns to the clinic today for followup visit. The patient was started on systemic chemotherapy with single agent Alimta 3 weeks ago status post 1 cycle. He works night time shift and was complaining a lot wasting his time to come for her chemotherapy after working the night. He does not want to wait today to receive his schedule cycle of chemotherapy and he knows to come in a different time. He tolerated the first cycle of his treatment well with no specific complaints adverse effects. He  continues to work regularly. He denied having any pain. The patient denied having any significant chest pain, shortness of breath, cough or hemoptysis. He has occasional dizzy spells. No significant weight loss or night sweats.   MEDICAL HISTORY: Past Medical History  Diagnosis Date  . H/O oral surgery 04/2012  . Cough   . Shortness of breath     with exertion  . Insomnia     d/t pain and takes Goody's PM  . Lung cancer 06/2012    RUL lung  . Hx of radiation therapy 07/16/12- 09/10/12    lower neck and upper chest region , 63 gray in 35 fx  . 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 Hx of radiation therapy 07/18/12    SRS to brain metastasis    ALLERGIES:  has No Known Allergies.  MEDICATIONS:  No current outpatient prescriptions on file.   No current facility-administered medications for this visit.    SURGICAL HISTORY:  Past Surgical History  Procedure Laterality Date  . Foot surgery    . Mouth surgery    . Video bronchoscopy with endobronchial ultrasound Right 06/19/2012    Procedure: VIDEO BRONCHOSCOPY WITH ENDOBRONCHIAL ULTRASOUND;  Surgeon: Collene Gobble, MD;  Location: [facility];  Service: Pulmonary;  Laterality: Right;    REVIEW OF SYSTEMS:  Constitutional: positive for fatigue Eyes: negative Ears, nose, mouth, throat, and face: negative Respiratory: negative Cardiovascular: negative Gastrointestinal: negative Genitourinary:negative Integument/breast: negative Hematologic/lymphatic: negative Musculoskeletal:negative Neurological: negative Behavioral/Psych: negative Endocrine: negative Allergic/Immunologic: negative   PHYSICAL EXAMINATION: General appearance: alert, cooperative, fatigued and no distress Head: Normocephalic, without obvious abnormality, atraumatic Neck:  palppable left lower cervical and supraclavicular  lymphadenopathy. Lymph nodes: palppable left lower cervical and supraclavicular lymphadenopathy. Resp: clear to auscultation bilaterally Back: symmetric, no  curvature. ROM normal. No CVA tenderness. Cardio: regular rate and rhythm, S1, S2 normal, no murmur, click, rub or gallop GI: soft, non-tender; bowel sounds normal; no masses,  no organomegaly Extremities: extremities normal, atraumatic, no cyanosis or edema Neurologic: Alert and oriented X 3, normal strength and tone. Normal symmetric reflexes. Normal coordination and gait  ECOG PERFORMANCE STATUS: 1 - Symptomatic but completely ambulatory  Blood pressure 110/60, pulse 80, temperature 98.4 F (36.9 C), temperature source Oral, resp. rate 18, height 5' 11"  (1.803 m), weight 141 lb 12.8 oz (64.32 kg), SpO2 100.00%.  LABORATORY DATA: Lab Results  Component Value Date   WBC 4.8 05/14/2013   HGB 12.4* 05/14/2013   HCT 37.1* 05/14/2013   MCV 85.1 05/14/2013   PLT 238 05/14/2013      Chemistry      Component Value Date/Time   NA 139 04/23/2013 0842   NA 141 06/19/2012 0800   K 4.4 04/23/2013 0842   K 3.9 06/19/2012 0800   CL 103 09/02/2012 0955   CL 105 06/19/2012 0800   CO2 22 04/23/2013 0842   CO2 25 06/19/2012 0800   BUN 15.6 04/23/2013 0842   BUN 18 06/19/2012 0800   CREATININE 1.0 04/23/2013 0842   CREATININE 0.86 06/19/2012 0800      Component Value Date/Time   CALCIUM 9.1 04/23/2013 0842   CALCIUM 9.5 06/19/2012 0800   ALKPHOS 141 04/23/2013 0842   AST 17 04/23/2013 0842   ALT 10 04/23/2013 0842   BILITOT 0.74 04/23/2013 0842       RADIOGRAPHIC STUDIES: Ct Chest W Contrast  04/09/2013   CLINICAL DATA:  Non-small-cell lung cancer the right lung. Followup. Radiation therapy through July. Brain metastasis in May.  EXAM: CT CHEST, ABDOMEN, AND PELVIS WITH CONTRAST  TECHNIQUE: Multidetector CT imaging of the chest, abdomen and pelvis was performed following the standard protocol during bolus administration of intravenous contrast.  CONTRAST:  155m OMNIPAQUE IOHEXOL 300 MG/ML  SOLN  COMPARISON:  NM PET IMAGE RESTAG (PS) SKULL BASE TO THIGH dated 12/18/2012; CT CHEST W/CM dated 09/20/2012  FINDINGS: CT CHEST  FINDINGS  Lungs/Pleura: Centrilobular emphysema. Resolution of posterior right upper lobe lung nodule with only mild scarring identified in this area, including on image 10/series 3. Progressive radiation fibrosis in the more anterior right apex also identified. Mild atelectasis at the left lung base.  Multiple upper lobe predominant pulmonary nodules. These are on the order of 2 mm. The majority of these appear new since the 09/2009 14 CT. Example today include on images 11, 18, 19, 21. More ill-defined nodules on the 09/20/2012 exam are improved to resolved. No lobar consolidation. No pleural fluid.  Heart/Mediastinum: Normal heart size, without pericardial effusion. 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 No central pulmonary embolism, on this non-dedicated study. Persistent mediastinal soft tissue density. Right paratracheal component measures 1.0 cm on image 19/series 2 versus 1.4 cm on 09/20/2012. Similar to the 12/18/2012 exam. No hilar adenopathy.  CT ABDOMEN AND PELVIS FINDINGS  Abdomen/Pelvis: Altered perfusion about the anterior aspect of the liver secondary to presumed central venous insufficiency in the chest. Example its image 76/series 2.  Normal spleen, stomach, pancreas, gallbladder, biliary tract, adrenal glands, kidneys. Early but age advanced aortic atherosclerosis. No retroperitoneal or retrocrural adenopathy. Normal colon, appendix, and  terminal ileum. Normal small bowel without abdominal ascites.  No pelvic adenopathy. Normal urinary bladder and prostate. No significant free fluid.  Bones/Musculoskeletal: No acute osseous abnormality.  IMPRESSION: 1. Similar borderline thoracic adenopathy. 2. Further response to therapy of posterior right apical lung nodule. No well-defined residual nodule identified. 3. Scattered tiny upper lobe pulmonary nodules. Many of these appear new since  09/20/2012. Suspicious for early pulmonary metastasis. Given presence of more ill-defined nodules on the 09/20/2012 CT, infectious or inflammatory etiology is possible, but felt less likely. 4. No evidence of subdiaphragmatic disease. 5. Central venous insufficiency due to high SVC narrowing. No thrombosis.   Electronically Signed   By: Abigail Miyamoto M.D.   On: 04/09/2013 12:34     ASSESSMENT AND PLAN: This is a 38 years old [place] American male with metastatic non-small cell lung cancer, adenocarcinoma with multiple and recurrent brain metastasis status post systemic chemotherapy with carboplatin and Alimta status post 6 cycles. He also underwent stereotactic radiotherapy to multiple brain lesions recently.  The patient is currently undergoing systemic chemotherapy with single agent Alimta but he is noncompliant with his scheduled treatment and he keep changing his appointment for the visits and chemotherapy at regular doses.  He does not want to take his chemotherapy today because he wants to go home and sleep. I was also confirmed by the scheduler that the patient come to her that his chemotherapy is supposed to be every 90 days.  I have a lengthy discussion with the patient today about his condition and the importance to be compliant with his treatment to schedule. He understands that his treatment is every 3 weeks. I expect him to come back tomorrow for her second cycle of the chemotherapy. I would see him back for followup visit in 3 weeks with the start of cycle #3. Unfortunately the patient continues to be noncompliant, I would have to discharge him from my practice and ask him to see a different oncologist.  For smoke cessation, I advised the patient to quit smoking and offered him to smoke cessation program. He would come back for follow up visit in one month's for reevaluation and management any adverse effect of his treatment. He was advised to call immediately if he has any concerning  symptoms in the interval.  The patient voices understanding of current disease status and treatment options and is in agreement with the current care plan.  All questions were answered. The patient knows to call the clinic with any problems, questions or concerns. We can certainly see the patient much sooner if necessary.  Disclaimer: This note was dictated with voice recognition software. Similar sounding words can inadvertently be transcribed and may not be corrected upon review.

## 2013-05-14 NOTE — Patient Instructions (Signed)
Smoking Cessation, Tips for Success If you are ready to quit smoking, congratulations! You have chosen to help yourself be healthier. Cigarettes bring nicotine, tar, carbon monoxide, and other irritants into your body. Your lungs, heart, and blood vessels will be able to work better without these poisons. There are many different ways to quit smoking. Nicotine gum, nicotine patches, a nicotine inhaler, or nicotine nasal spray can help with physical craving. Hypnosis, support groups, and medicines help break the habit of smoking. WHAT THINGS CAN I DO TO MAKE QUITTING EASIER?  Here are some tips to help you quit for good:  Pick a date when you will quit smoking completely. Tell all of your friends and family about your plan to quit on that date.  Do not try to slowly cut down on the number of cigarettes you are smoking. Pick a quit date and quit smoking completely starting on that day.  Throw away all cigarettes.   Clean and remove all ashtrays from your home, work, and car.   On a card, write down your reasons for quitting. Carry the card with you and read it when you get the urge to smoke.   Cleanse your body of nicotine. Drink enough water and fluids to keep your urine clear or pale yellow. Do this after quitting to flush the nicotine from your body.   Learn to predict your moods. Do not let a bad situation be your excuse to have a cigarette. Some situations in your life might tempt you into wanting a cigarette.   Never have "just one" cigarette. It leads to wanting another and another. Remind yourself of your decision to quit.   Change habits associated with smoking. If you smoked while driving or when feeling stressed, try other activities to replace smoking. Stand up when drinking your coffee. Brush your teeth after eating. Sit in a different chair when you read the paper. Avoid alcohol while trying to quit, and try to drink fewer caffeinated beverages. Alcohol and caffeine may urge  you to smoke.   Avoid foods and drinks that can trigger a desire to smoke, such as sugary or spicy foods and alcohol.   Ask people who smoke not to smoke around you.   Have something planned to do right after eating or having a cup of coffee. For example, plan to take a walk or exercise.   Try a relaxation exercise to calm you down and decrease your stress. Remember, you may be tense and nervous for the first 2 weeks after you quit, but this will pass.   Find new activities to keep your hands busy. Play with a pen, coin, or rubber band. Doodle or draw things on paper.   Brush your teeth right after eating. This will help cut down on the craving for the taste of tobacco after meals. You can also try mouthwash.   Use oral substitutes in place of cigarettes. Try using lemon drops, carrots, cinnamon sticks, or chewing gum. Keep them handy so they are available when you have the urge to smoke.   When you have the urge to smoke, try deep breathing.   Designate your home as a nonsmoking area.   If you are a heavy smoker, ask your health care provider about a prescription for nicotine chewing gum. It can ease your withdrawal from nicotine.   Reward yourself. Set aside the cigarette money you save and buy yourself something nice.   Look for support from others. Join a support group or   smoking cessation program. Ask someone at home or at work to help you with your plan to quit smoking.   Always ask yourself, "Do I need this cigarette or is this just a reflex?" Tell yourself, "Today, I choose not to smoke," or "I do not want to smoke." You are reminding yourself of your decision to quit.  Do not replace cigarette smoking with electronic cigarettes (commonly called e-cigarettes). The safety of e-cigarettes is unknown, and some may contain harmful chemicals.  If you relapse, do not give up! Plan ahead and think about what you will do the next time you get the urge to smoke.  HOW WILL  I FEEL WHEN I QUIT SMOKING? You may have symptoms of withdrawal because your body is used to nicotine (the addictive substance in cigarettes). You may crave cigarettes, be irritable, feel very hungry, cough often, get headaches, or have difficulty concentrating. The withdrawal symptoms are only temporary. They are strongest when you first quit but will go away within 10 14 days. When withdrawal symptoms occur, stay in control. Think about your reasons for quitting. Remind yourself that these are signs that your body is healing and getting used to being without cigarettes. Remember that withdrawal symptoms are easier to treat than the major diseases that smoking can cause.  Even after the withdrawal is over, expect periodic urges to smoke. However, these cravings are generally short lived and will go away whether you smoke or not. Do not smoke!  WHAT RESOURCES ARE AVAILABLE TO HELP ME QUIT SMOKING? Your health care provider can direct you to community resources or hospitals for support, which may include:  Group support.  Education.  Hypnosis.  Therapy. Document Released: 11/26/2003 Document Revised: 12/18/2012 Document Reviewed: 08/15/2012 [employer] Patient Information 2014 [employer].  

## 2013-05-14 NOTE — Telephone Encounter (Signed)
gv and printed appt sched and avs for pt for March and April...sed added tx. °

## 2013-05-21 ENCOUNTER — Other Ambulatory Visit (HOSPITAL_BASED_OUTPATIENT_CLINIC_OR_DEPARTMENT_OTHER): Payer: 59

## 2013-05-21 DIAGNOSIS — C3491 Malignant neoplasm of unspecified part of right bronchus or lung: Secondary | ICD-10-CM

## 2013-05-21 DIAGNOSIS — C3410 Malignant neoplasm of upper lobe, unspecified bronchus or lung: Secondary | ICD-10-CM

## 2013-05-21 LAB — CBC WITH DIFFERENTIAL/PLATELET
BASO%: 0.5 % (ref 0.0–2.0)
Basophils Absolute: 0 10*3/uL (ref 0.0–0.1)
EOS ABS: 0.1 10*3/uL (ref 0.0–0.5)
EOS%: 2.8 % (ref 0.0–7.0)
HCT: 39.3 % (ref 38.4–49.9)
HGB: 12.9 g/dL — ABNORMAL LOW (ref 13.0–17.1)
LYMPH%: 21.4 % (ref 14.0–49.0)
MCH: 28.3 pg (ref 27.2–33.4)
MCHC: 32.7 g/dL (ref 32.0–36.0)
MCV: 86.3 fL (ref 79.3–98.0)
MONO#: 0.5 10*3/uL (ref 0.1–0.9)
MONO%: 12.4 % (ref 0.0–14.0)
NEUT#: 2.7 10*3/uL (ref 1.5–6.5)
NEUT%: 62.9 % (ref 39.0–75.0)
PLATELETS: 194 10*3/uL (ref 140–400)
RBC: 4.55 10*6/uL (ref 4.20–5.82)
RDW: 14.3 % (ref 11.0–14.6)
WBC: 4.4 10*3/uL (ref 4.0–10.3)
lymph#: 0.9 10*3/uL (ref 0.9–3.3)

## 2013-05-21 LAB — COMPREHENSIVE METABOLIC PANEL (CC13)
ALBUMIN: 4 g/dL (ref 3.5–5.0)
ALT: 14 U/L (ref 0–55)
ANION GAP: 10 meq/L (ref 3–11)
AST: 13 U/L (ref 5–34)
Alkaline Phosphatase: 150 U/L (ref 40–150)
BILIRUBIN TOTAL: 0.35 mg/dL (ref 0.20–1.20)
BUN: 12.7 mg/dL (ref 7.0–26.0)
CHLORIDE: 104 meq/L (ref 98–109)
CO2: 22 meq/L (ref 22–29)
Calcium: 9.5 mg/dL (ref 8.4–10.4)
Creatinine: 1 mg/dL (ref 0.7–1.3)
GLUCOSE: 149 mg/dL — AB (ref 70–140)
POTASSIUM: 4 meq/L (ref 3.5–5.1)
SODIUM: 136 meq/L (ref 136–145)
TOTAL PROTEIN: 6.8 g/dL (ref 6.4–8.3)

## 2013-05-28 ENCOUNTER — Other Ambulatory Visit: Payer: Self-pay | Admitting: Physician Assistant

## 2013-05-28 ENCOUNTER — Other Ambulatory Visit (HOSPITAL_BASED_OUTPATIENT_CLINIC_OR_DEPARTMENT_OTHER): Payer: 59

## 2013-05-28 DIAGNOSIS — C3410 Malignant neoplasm of upper lobe, unspecified bronchus or lung: Secondary | ICD-10-CM

## 2013-05-28 DIAGNOSIS — C3491 Malignant neoplasm of unspecified part of right bronchus or lung: Secondary | ICD-10-CM

## 2013-05-28 LAB — CBC WITH DIFFERENTIAL/PLATELET
BASO%: 0.4 % (ref 0.0–2.0)
Basophils Absolute: 0 10*3/uL (ref 0.0–0.1)
EOS%: 1.8 % (ref 0.0–7.0)
Eosinophils Absolute: 0.1 10*3/uL (ref 0.0–0.5)
HCT: 41.6 % (ref 38.4–49.9)
HGB: 13.8 g/dL (ref 13.0–17.1)
LYMPH#: 1 10*3/uL (ref 0.9–3.3)
LYMPH%: 16.4 % (ref 14.0–49.0)
MCH: 28.7 pg (ref 27.2–33.4)
MCHC: 33.2 g/dL (ref 32.0–36.0)
MCV: 86.4 fL (ref 79.3–98.0)
MONO#: 0.6 10*3/uL (ref 0.1–0.9)
MONO%: 9.7 % (ref 0.0–14.0)
NEUT#: 4.3 10*3/uL (ref 1.5–6.5)
NEUT%: 71.7 % (ref 39.0–75.0)
Platelets: 194 10*3/uL (ref 140–400)
RBC: 4.81 10*6/uL (ref 4.20–5.82)
RDW: 14.3 % (ref 11.0–14.6)
WBC: 6 10*3/uL (ref 4.0–10.3)

## 2013-06-02 ENCOUNTER — Other Ambulatory Visit: Payer: Self-pay | Admitting: Internal Medicine

## 2013-06-04 ENCOUNTER — Ambulatory Visit (HOSPITAL_BASED_OUTPATIENT_CLINIC_OR_DEPARTMENT_OTHER): Payer: 59

## 2013-06-04 ENCOUNTER — Ambulatory Visit (HOSPITAL_BASED_OUTPATIENT_CLINIC_OR_DEPARTMENT_OTHER): Payer: 59 | Admitting: Internal Medicine

## 2013-06-04 ENCOUNTER — Encounter: Payer: Self-pay | Admitting: Pharmacist

## 2013-06-04 ENCOUNTER — Telehealth: Payer: Self-pay | Admitting: Internal Medicine

## 2013-06-04 ENCOUNTER — Encounter: Payer: Self-pay | Admitting: Internal Medicine

## 2013-06-04 VITALS — BP 112/55 | HR 92 | Temp 98.1°F | Resp 18 | Ht 71.0 in | Wt 140.7 lb

## 2013-06-04 DIAGNOSIS — C7949 Secondary malignant neoplasm of other parts of nervous system: Secondary | ICD-10-CM

## 2013-06-04 DIAGNOSIS — C7931 Secondary malignant neoplasm of brain: Secondary | ICD-10-CM

## 2013-06-04 DIAGNOSIS — Z5111 Encounter for antineoplastic chemotherapy: Secondary | ICD-10-CM

## 2013-06-04 DIAGNOSIS — C778 Secondary and unspecified malignant neoplasm of lymph nodes of multiple regions: Secondary | ICD-10-CM

## 2013-06-04 DIAGNOSIS — F17200 Nicotine dependence, unspecified, uncomplicated: Secondary | ICD-10-CM

## 2013-06-04 DIAGNOSIS — C3410 Malignant neoplasm of upper lobe, unspecified bronchus or lung: Secondary | ICD-10-CM

## 2013-06-04 DIAGNOSIS — C3490 Malignant neoplasm of unspecified part of unspecified bronchus or lung: Secondary | ICD-10-CM

## 2013-06-04 DIAGNOSIS — C3491 Malignant neoplasm of unspecified part of right bronchus or lung: Secondary | ICD-10-CM

## 2013-06-04 LAB — COMPREHENSIVE METABOLIC PANEL (CC13)
ALBUMIN: 3.9 g/dL (ref 3.5–5.0)
ALT: 11 U/L (ref 0–55)
ANION GAP: 12 meq/L — AB (ref 3–11)
AST: 15 U/L (ref 5–34)
Alkaline Phosphatase: 144 U/L (ref 40–150)
BUN: 11.8 mg/dL (ref 7.0–26.0)
CO2: 21 meq/L — AB (ref 22–29)
CREATININE: 0.8 mg/dL (ref 0.7–1.3)
Calcium: 9.2 mg/dL (ref 8.4–10.4)
Chloride: 107 mEq/L (ref 98–109)
Glucose: 92 mg/dl (ref 70–140)
POTASSIUM: 4 meq/L (ref 3.5–5.1)
Sodium: 139 mEq/L (ref 136–145)
Total Bilirubin: 0.2 mg/dL (ref 0.20–1.20)
Total Protein: 6.9 g/dL (ref 6.4–8.3)

## 2013-06-04 LAB — CBC WITH DIFFERENTIAL/PLATELET
BASO%: 0.3 % (ref 0.0–2.0)
BASOS ABS: 0 10*3/uL (ref 0.0–0.1)
EOS%: 1 % (ref 0.0–7.0)
Eosinophils Absolute: 0.1 10*3/uL (ref 0.0–0.5)
HEMATOCRIT: 38.6 % (ref 38.4–49.9)
HEMOGLOBIN: 13 g/dL (ref 13.0–17.1)
LYMPH#: 0.9 10*3/uL (ref 0.9–3.3)
LYMPH%: 14 % (ref 14.0–49.0)
MCH: 28.6 pg (ref 27.2–33.4)
MCHC: 33.7 g/dL (ref 32.0–36.0)
MCV: 84.8 fL (ref 79.3–98.0)
MONO#: 0.7 10*3/uL (ref 0.1–0.9)
MONO%: 10.7 % (ref 0.0–14.0)
NEUT#: 4.7 10*3/uL (ref 1.5–6.5)
NEUT%: 74 % (ref 39.0–75.0)
Platelets: 199 10*3/uL (ref 140–400)
RBC: 4.55 10*6/uL (ref 4.20–5.82)
RDW: 14.4 % (ref 11.0–14.6)
WBC: 6.3 10*3/uL (ref 4.0–10.3)

## 2013-06-04 MED ORDER — ONDANSETRON 8 MG/50ML IVPB (CHCC)
8.0000 mg | Freq: Once | INTRAVENOUS | Status: AC
  Administered 2013-06-04: 8 mg via INTRAVENOUS

## 2013-06-04 MED ORDER — ONDANSETRON 8 MG/NS 50 ML IVPB
INTRAVENOUS | Status: AC
  Filled 2013-06-04: qty 8

## 2013-06-04 MED ORDER — DEXAMETHASONE SODIUM PHOSPHATE 10 MG/ML IJ SOLN
INTRAMUSCULAR | Status: AC
  Filled 2013-06-04: qty 1

## 2013-06-04 MED ORDER — DEXAMETHASONE SODIUM PHOSPHATE 10 MG/ML IJ SOLN
10.0000 mg | Freq: Once | INTRAMUSCULAR | Status: AC
  Administered 2013-06-04: 10 mg via INTRAVENOUS

## 2013-06-04 MED ORDER — SODIUM CHLORIDE 0.9 % IV SOLN
500.0000 mg/m2 | Freq: Once | INTRAVENOUS | Status: AC
  Administered 2013-06-04: 900 mg via INTRAVENOUS
  Filled 2013-06-04: qty 36

## 2013-06-04 MED ORDER — SODIUM CHLORIDE 0.9 % IV SOLN
Freq: Once | INTRAVENOUS | Status: AC
  Administered 2013-06-04: 10:00:00 via INTRAVENOUS

## 2013-06-04 MED ORDER — OXYCODONE-ACETAMINOPHEN 5-325 MG PO TABS: 1.0000 | ORAL_TABLET | Freq: Four times a day (QID) | ORAL | Status: DC | PRN

## 2013-06-04 NOTE — Patient Instructions (Signed)
[facility] Discharge Instructions for Patients Receiving Chemotherapy  Today you received the following chemotherapy agents Alimta.   To help prevent nausea and vomiting after your treatment, we encourage you to take your nausea medication as prescribed.    If you develop nausea and vomiting that is not controlled by your nausea medication, call the clinic.   BELOW ARE SYMPTOMS THAT SHOULD BE REPORTED IMMEDIATELY:  *FEVER GREATER THAN 100.5 F  *CHILLS WITH OR WITHOUT FEVER  NAUSEA AND VOMITING THAT IS NOT CONTROLLED WITH YOUR NAUSEA MEDICATION  *UNUSUAL SHORTNESS OF BREATH  *UNUSUAL BRUISING OR BLEEDING  TENDERNESS IN MOUTH AND THROAT WITH OR WITHOUT PRESENCE OF ULCERS  *URINARY PROBLEMS  *BOWEL PROBLEMS  UNUSUAL RASH Items with * indicate a potential emergency and should be followed up as soon as possible.  Feel free to call the clinic should you have any questions or concerns. The clinic phone number is [phone]. It was my pleasure to take care of you today!  Leeanne Rio, RN

## 2013-06-04 NOTE — Progress Notes (Signed)
Checked in patient. No copay was paid--he said he doesn't have one.

## 2013-06-04 NOTE — Progress Notes (Signed)
[facility] Telephone:(336) [phone]   Fax:(336) [phone]  OFFICE PROGRESS NOTE  No PCP Per Patient No address on file  DIAGNOSIS: Metastatic non-small cell lung cancer, adenocarcinoma with unknown status of the EGFR mutation or ALK gene translocation secondary to insufficient material, diagnosed in April 2014.   PRIOR THERAPY:  1) stereotactic radiotherapy under the care of Dr. Tammi Klippel completed on 07/18/2012.  2) palliative radiotherapy to the left upper lobe and mediastinal lymph nodes. Completed 09/10/2012.  3) Systemic chemotherapy with carboplatin for AUC of 5 and Alimta 500 mg/M2 giving every 3 weeks. First dose was given on 07/15/2012. Status post 6 cycles, with partial response. 4) stereotactic radiotherapy to the 7 new brain lesions under the care of Dr. Tammi Klippel on 01/31/2013 and 02/05/2013.  CURRENT THERAPY:  1) systemic chemotherapy with single agent Alimta 500 mg/M2 every 3 weeks, status post 1 cycle. First dose on 04/23/2013.  CHEMOTHERAPY INTENT: Palliative  CURRENT # OF CHEMOTHERAPY CYCLES: 2  CURRENT ANTIEMETICS: Zofran, dexamethasone and Compazine  CURRENT SMOKING STATUS: Current smoker and I strongly advise him to quit smoking  ORAL CHEMOTHERAPY AND CONSENT: None  CURRENT BISPHOSPHONATES USE: None    PAIN MANAGEMENT: 0/10  NARCOTICS INDUCED CONSTIPATION: None  LIVING WILL AND CODE STATUS: Full code  INTERVAL HISTORY: Terrance Clark 38 y.o. male returns to the clinic today for followup visit. The patient was started on systemic chemotherapy with single agent Alimta 3 weeks ago status post 1 cycle. 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 He did not reschedule his appointment for the chemotherapy.  He is still very upset about having more metastatic brain lesions after his initial treatment. He mentioned that he felt worse after starting coming to the [facility]. He  is still in denial about his disease and the possibility of disease progression even with treatment. 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 He denied having any pain. The patient denied having any significant chest pain, shortness of breath, cough or hemoptysis. He has occasional dizzy spells. No significant weight loss or night sweats.   MEDICAL HISTORY: Past Medical History  Diagnosis Date  . H/O oral surgery 04/2012  . Cough   . Shortness of breath     with exertion  . Insomnia     d/t pain and takes Goody's PM  . Lung cancer 06/2012    RUL lung  . Hx of radiation therapy 07/16/12- 09/10/12    lower neck and upper chest region , 63 gray in 35 fx  . 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 Hx of radiation therapy 07/18/12    SRS to brain metastasis    ALLERGIES:  has No Known Allergies.  MEDICATIONS:  No current outpatient prescriptions on file.   No current facility-administered medications for this visit.    SURGICAL HISTORY:  Past Surgical History  Procedure Laterality Date  . Foot surgery    . Mouth surgery    . Video bronchoscopy with endobronchial ultrasound Right 06/19/2012    Procedure: VIDEO BRONCHOSCOPY WITH ENDOBRONCHIAL ULTRASOUND;  Surgeon: Collene Gobble, MD;  Location: [facility];  Service: Pulmonary;  Laterality: Right;    REVIEW OF SYSTEMS:  Constitutional: positive for fatigue Eyes: negative Ears, nose, mouth, throat, and face: negative Respiratory:  negative Cardiovascular: negative Gastrointestinal: negative Genitourinary:negative Integument/breast: negative Hematologic/lymphatic: negative Musculoskeletal:negative Neurological: negative Behavioral/Psych: negative Endocrine: negative Allergic/Immunologic: negative   PHYSICAL EXAMINATION: General appearance: alert, cooperative, fatigued and no distress Head: Normocephalic, without obvious abnormality,  atraumatic Neck:  palppable left lower cervical and supraclavicular lymphadenopathy. Lymph nodes: palppable left lower cervical and supraclavicular lymphadenopathy. Resp: clear to auscultation bilaterally Back: symmetric, no curvature. ROM normal. No CVA tenderness. Cardio: regular rate and rhythm, S1, S2 normal, no murmur, click, rub or gallop GI: soft, non-tender; bowel sounds normal; no masses,  no organomegaly Extremities: extremities normal, atraumatic, no cyanosis or edema Neurologic: Alert and oriented X 3, normal strength and tone. Normal symmetric reflexes. Normal coordination and gait  ECOG PERFORMANCE STATUS: 1 - Symptomatic but completely ambulatory  Blood pressure 112/55, pulse 92, temperature 98.1 F (36.7 C), temperature source Oral, resp. rate 18, height $RemoveBe'5\' 11"'DfmnqoBms$  (1.803 m), weight 140 lb 11.2 oz (63.821 kg), SpO2 98.00%.  LABORATORY DATA: Lab Results  Component Value Date   WBC 6.3 06/04/2013   HGB 13.0 06/04/2013   HCT 38.6 06/04/2013   MCV 84.8 06/04/2013   PLT 199 06/04/2013      Chemistry      Component Value Date/Time   NA 136 05/21/2013 0841   NA 141 06/19/2012 0800   K 4.0 05/21/2013 0841   K 3.9 06/19/2012 0800   CL 103 09/02/2012 0955   CL 105 06/19/2012 0800   CO2 22 05/21/2013 0841   CO2 25 06/19/2012 0800   BUN 12.7 05/21/2013 0841   BUN 18 06/19/2012 0800   CREATININE 1.0 05/21/2013 0841   CREATININE 0.86 06/19/2012 0800      Component Value Date/Time   CALCIUM 9.5 05/21/2013 0841   CALCIUM 9.5 06/19/2012 0800   ALKPHOS 150 05/21/2013 0841   AST 13 05/21/2013 0841   ALT 14 05/21/2013 0841   BILITOT 0.35 05/21/2013 0841       RADIOGRAPHIC STUDIES:  ASSESSMENT AND PLAN: This is a 38 years old African American male with metastatic non-small cell lung cancer, adenocarcinoma with multiple and recurrent brain metastasis status post systemic chemotherapy with carboplatin and Alimta status post 6 cycles. He also underwent stereotactic radiotherapy to multiple brain lesions  recently.  The patient is currently undergoing systemic chemotherapy with single agent Alimta but he is noncompliant with his scheduled treatment and he keep changing his appointment for the visits and chemotherapy at regular doses. She missed cycle #23 weeks ago because he wanted to go back home and sleep.  He is willing to proceed with cycle #2 today. I had  another lengthy discussion with the patient today about his condition and the importance to be compliant with his treatment to schedule. He understands that his treatment is every 3 weeks. I would see him back for followup visit in 3 weeks with the start of cycle #3. For smoke cessation, I advised the patient to quit smoking and offered him to smoke cessation program. He would come back for follow up visit in one month's for reevaluation and management any adverse effect of his treatment. He was advised to call immediately if he has any concerning symptoms in the interval.  The patient voices understanding of current disease status and treatment options and is in agreement with the current care plan.  All questions were answered. The patient knows to call the clinic with any problems, questions or concerns. We can certainly see the patient much sooner if necessary.  Disclaimer: This note was dictated with  voice recognition software. Similar sounding words can inadvertently be transcribed and may not be corrected upon review.

## 2013-06-04 NOTE — Telephone Encounter (Signed)
gv and printed appt sched and avs for pt for April thru May.l.Marland Kitchen.sed added tx.

## 2013-06-11 ENCOUNTER — Other Ambulatory Visit (HOSPITAL_BASED_OUTPATIENT_CLINIC_OR_DEPARTMENT_OTHER): Payer: 59

## 2013-06-11 DIAGNOSIS — C3410 Malignant neoplasm of upper lobe, unspecified bronchus or lung: Secondary | ICD-10-CM

## 2013-06-11 DIAGNOSIS — C3491 Malignant neoplasm of unspecified part of right bronchus or lung: Secondary | ICD-10-CM

## 2013-06-11 LAB — CBC WITH DIFFERENTIAL/PLATELET
BASO%: 1 % (ref 0.0–2.0)
BASOS ABS: 0 10*3/uL (ref 0.0–0.1)
EOS%: 2.2 % (ref 0.0–7.0)
Eosinophils Absolute: 0.1 10*3/uL (ref 0.0–0.5)
HCT: 39.1 % (ref 38.4–49.9)
HEMOGLOBIN: 12.7 g/dL — AB (ref 13.0–17.1)
LYMPH#: 0.8 10*3/uL — AB (ref 0.9–3.3)
LYMPH%: 21.4 % (ref 14.0–49.0)
MCH: 28.1 pg (ref 27.2–33.4)
MCHC: 32.5 g/dL (ref 32.0–36.0)
MCV: 86.5 fL (ref 79.3–98.0)
MONO#: 0.2 10*3/uL (ref 0.1–0.9)
MONO%: 6.4 % (ref 0.0–14.0)
NEUT#: 2.5 10*3/uL (ref 1.5–6.5)
NEUT%: 69 % (ref 39.0–75.0)
Platelets: 181 10*3/uL (ref 140–400)
RBC: 4.52 10*6/uL (ref 4.20–5.82)
RDW: 14.2 % (ref 11.0–14.6)
WBC: 3.7 10*3/uL — ABNORMAL LOW (ref 4.0–10.3)

## 2013-06-11 LAB — COMPREHENSIVE METABOLIC PANEL (CC13)
ALBUMIN: 4.1 g/dL (ref 3.5–5.0)
ALK PHOS: 135 U/L (ref 40–150)
ALT: 12 U/L (ref 0–55)
AST: 14 U/L (ref 5–34)
Anion Gap: 11 mEq/L (ref 3–11)
BUN: 16.6 mg/dL (ref 7.0–26.0)
CALCIUM: 9.5 mg/dL (ref 8.4–10.4)
CHLORIDE: 104 meq/L (ref 98–109)
CO2: 24 mEq/L (ref 22–29)
Creatinine: 1 mg/dL (ref 0.7–1.3)
Glucose: 90 mg/dl (ref 70–140)
Potassium: 4.2 mEq/L (ref 3.5–5.1)
SODIUM: 139 meq/L (ref 136–145)
TOTAL PROTEIN: 7.1 g/dL (ref 6.4–8.3)
Total Bilirubin: 0.22 mg/dL (ref 0.20–1.20)

## 2013-06-15 ENCOUNTER — Encounter: Payer: Self-pay | Admitting: Radiation Oncology

## 2013-06-15 NOTE — Progress Notes (Signed)
  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 07/16/12-09/10/2012, Lower neck and upper chest region, 63 gray in 35 fractions   2. 07/18/2012 stereotactic radiosurgery for three brain metastasis   Right parietal 21 mm  Right posterior parietal 6 mm  Left thalamic 5 mm   3. 01/31/2013 and 02/05/2013 stereotactic radiosurgery to 7 new lesions to 20 Gy  Right frontal 12 mm  Right parietal 5 mm  Left parietal 4 mm  Left insular 3 mm  Right occipital 3 mm  Left Thalamus 3 mm  Left Occipital 1 mm  4.  04/30/2013 5 new brain metastases were treated with SRS to 20 Gy:   Cerebellar vermis 4 mm  The first Left temporal 3 mm   The second Left temporal 3 mm  Left Occipital 2 mm   Left medial parietal 1 mm   Interval Since Last Radiation:  6  weeks  Narrative:  The patient returns today for routine follow-up.  The recent films were presented in our multidisciplinary conference with neuroradiology just prior to the clinic.  Reports dizziness often. Mostly when standing from a sitting position. Concerned back of thighs go numb often. Denies headache, nausea, vomiting, diplopia or ringing in the ears. Reports left arm numbness yesterday. Reports pain in left neck. Palpable nodularity noted left neck. Continues to take alimta every three weeks. Works full time. Steady gait                             ALLERGIES:  has No Known Allergies.  Meds: Current Outpatient Prescriptions  Medication Sig Dispense Refill  . omeprazole (PRILOSEC) 20 MG capsule Take 20 mg by mouth daily.      Marland Kitchen oxyCODONE-acetaminophen (PERCOCET/ROXICET) 5-325 MG per tablet Take  1 tablet by mouth every 6 (six) hours as needed for severe pain.  30 tablet  0   No current facility-administered medications for this encounter.    Physical Findings: The patient is in no acute distress. Patient is alert and oriented.  weight is 141 lb 11.2 oz (64.275 kg). His blood pressure is 114/73 and his pulse is 82. His respiration is 16. .  No significant changes.  Lab Findings: Lab Results  Component Value Date   WBC 3.7* 06/11/2013   HGB 12.7* 06/11/2013   HCT 39.1 06/11/2013   MCV 86.5 06/11/2013   PLT 181 06/11/2013   Radiographic Findings: No results found.  Impression:  The patient has some leg weakness and numbness with some falls.  Plan:  MRI T and L spine then follow-up.  _____________________________________  Sheral Apley. Tammi Klippel, M.D.

## 2013-06-16 ENCOUNTER — Ambulatory Visit
Admission: RE | Admit: 2013-06-16 | Discharge: 2013-06-16 | Disposition: A | Discharge status: Home / Self Care | Payer: 59 | Source: Ambulatory Visit | Attending: Radiation Oncology | Admitting: Radiation Oncology

## 2013-06-16 ENCOUNTER — Encounter: Payer: Self-pay | Admitting: Radiation Oncology

## 2013-06-16 VITALS — BP 114/73 | HR 82 | Resp 16 | Wt 141.7 lb

## 2013-06-16 DIAGNOSIS — C7931 Secondary malignant neoplasm of brain: Secondary | ICD-10-CM

## 2013-06-16 DIAGNOSIS — C3490 Malignant neoplasm of unspecified part of unspecified bronchus or lung: Secondary | ICD-10-CM

## 2013-06-16 DIAGNOSIS — C7949 Secondary malignant neoplasm of other parts of nervous system: Principal | ICD-10-CM

## 2013-06-16 NOTE — Progress Notes (Signed)
Reports dizziness often. Mostly when standing from a sitting position. Concerned back of thighs go numb often. Denies headache, nausea, vomiting, diplopia or ringing in the ears. Reports left arm numbness yesterday. Reports pain in left neck. Palpable nodularity noted left neck. Continues to take alimta every three weeks. Works full time. Steady gait.

## 2013-06-18 ENCOUNTER — Other Ambulatory Visit (HOSPITAL_BASED_OUTPATIENT_CLINIC_OR_DEPARTMENT_OTHER): Payer: 59

## 2013-06-18 ENCOUNTER — Other Ambulatory Visit: Payer: Self-pay | Admitting: Radiation Therapy

## 2013-06-18 DIAGNOSIS — C3410 Malignant neoplasm of upper lobe, unspecified bronchus or lung: Secondary | ICD-10-CM

## 2013-06-18 DIAGNOSIS — C7931 Secondary malignant neoplasm of brain: Secondary | ICD-10-CM

## 2013-06-18 DIAGNOSIS — C7949 Secondary malignant neoplasm of other parts of nervous system: Principal | ICD-10-CM

## 2013-06-18 DIAGNOSIS — C3490 Malignant neoplasm of unspecified part of unspecified bronchus or lung: Secondary | ICD-10-CM

## 2013-06-18 LAB — CBC WITH DIFFERENTIAL/PLATELET
BASO%: 0.4 % (ref 0.0–2.0)
Basophils Absolute: 0 10*3/uL (ref 0.0–0.1)
EOS%: 2.8 % (ref 0.0–7.0)
Eosinophils Absolute: 0.1 10*3/uL (ref 0.0–0.5)
HCT: 39.6 % (ref 38.4–49.9)
HGB: 12.8 g/dL — ABNORMAL LOW (ref 13.0–17.1)
LYMPH%: 22 % (ref 14.0–49.0)
MCH: 28.5 pg (ref 27.2–33.4)
MCHC: 32.4 g/dL (ref 32.0–36.0)
MCV: 87.8 fL (ref 79.3–98.0)
MONO#: 0.6 10*3/uL (ref 0.1–0.9)
MONO%: 17.4 % — AB (ref 0.0–14.0)
NEUT%: 57.4 % (ref 39.0–75.0)
NEUTROS ABS: 2.1 10*3/uL (ref 1.5–6.5)
PLATELETS: 190 10*3/uL (ref 140–400)
RBC: 4.5 10*6/uL (ref 4.20–5.82)
RDW: 14.3 % (ref 11.0–14.6)
WBC: 3.7 10*3/uL — ABNORMAL LOW (ref 4.0–10.3)
lymph#: 0.8 10*3/uL — ABNORMAL LOW (ref 0.9–3.3)

## 2013-06-18 LAB — COMPREHENSIVE METABOLIC PANEL (CC13)
ALK PHOS: 145 U/L (ref 40–150)
ALT: 11 U/L (ref 0–55)
AST: 17 U/L (ref 5–34)
Albumin: 4 g/dL (ref 3.5–5.0)
Anion Gap: 9 mEq/L (ref 3–11)
BUN: 11.6 mg/dL (ref 7.0–26.0)
CO2: 26 mEq/L (ref 22–29)
CREATININE: 1 mg/dL (ref 0.7–1.3)
Calcium: 9.5 mg/dL (ref 8.4–10.4)
Chloride: 104 mEq/L (ref 98–109)
Glucose: 118 mg/dl (ref 70–140)
POTASSIUM: 3.9 meq/L (ref 3.5–5.1)
Sodium: 139 mEq/L (ref 136–145)
Total Bilirubin: 0.42 mg/dL (ref 0.20–1.20)
Total Protein: 6.9 g/dL (ref 6.4–8.3)

## 2013-06-20 ENCOUNTER — Other Ambulatory Visit: Payer: 59

## 2013-06-23 ENCOUNTER — Ambulatory Visit: Payer: 59 | Attending: Radiation Oncology | Admitting: Radiation Oncology

## 2013-06-23 ENCOUNTER — Ambulatory Visit: Payer: 59

## 2013-06-24 ENCOUNTER — Other Ambulatory Visit: Payer: Self-pay | Admitting: Medical Oncology

## 2013-06-24 DIAGNOSIS — C3490 Malignant neoplasm of unspecified part of unspecified bronchus or lung: Secondary | ICD-10-CM

## 2013-06-25 ENCOUNTER — Telehealth: Payer: Self-pay | Admitting: Internal Medicine

## 2013-06-25 ENCOUNTER — Encounter: Payer: Self-pay | Admitting: Radiation Oncology

## 2013-06-25 ENCOUNTER — Ambulatory Visit (HOSPITAL_BASED_OUTPATIENT_CLINIC_OR_DEPARTMENT_OTHER): Payer: 59 | Admitting: Physician Assistant

## 2013-06-25 ENCOUNTER — Ambulatory Visit (HOSPITAL_BASED_OUTPATIENT_CLINIC_OR_DEPARTMENT_OTHER): Payer: 59

## 2013-06-25 ENCOUNTER — Encounter: Payer: Self-pay | Admitting: Physician Assistant

## 2013-06-25 ENCOUNTER — Other Ambulatory Visit (HOSPITAL_BASED_OUTPATIENT_CLINIC_OR_DEPARTMENT_OTHER): Payer: 59

## 2013-06-25 ENCOUNTER — Encounter: Payer: Self-pay | Admitting: Internal Medicine

## 2013-06-25 ENCOUNTER — Other Ambulatory Visit: Payer: 59

## 2013-06-25 ENCOUNTER — Ambulatory Visit: Payer: 59

## 2013-06-25 ENCOUNTER — Other Ambulatory Visit: Payer: Self-pay | Admitting: Radiation Therapy

## 2013-06-25 VITALS — BP 122/77 | HR 84 | Temp 98.0°F | Resp 18 | Ht 71.0 in | Wt 140.2 lb

## 2013-06-25 DIAGNOSIS — C7931 Secondary malignant neoplasm of brain: Secondary | ICD-10-CM

## 2013-06-25 DIAGNOSIS — C3410 Malignant neoplasm of upper lobe, unspecified bronchus or lung: Secondary | ICD-10-CM

## 2013-06-25 DIAGNOSIS — C7949 Secondary malignant neoplasm of other parts of nervous system: Secondary | ICD-10-CM

## 2013-06-25 DIAGNOSIS — C3490 Malignant neoplasm of unspecified part of unspecified bronchus or lung: Secondary | ICD-10-CM

## 2013-06-25 DIAGNOSIS — Z5111 Encounter for antineoplastic chemotherapy: Secondary | ICD-10-CM

## 2013-06-25 DIAGNOSIS — F17200 Nicotine dependence, unspecified, uncomplicated: Secondary | ICD-10-CM

## 2013-06-25 LAB — CBC WITH DIFFERENTIAL/PLATELET
BASO%: 0.7 % (ref 0.0–2.0)
Basophils Absolute: 0 10*3/uL (ref 0.0–0.1)
EOS%: 2.4 % (ref 0.0–7.0)
Eosinophils Absolute: 0.1 10*3/uL (ref 0.0–0.5)
HCT: 38.7 % (ref 38.4–49.9)
HGB: 12.8 g/dL — ABNORMAL LOW (ref 13.0–17.1)
LYMPH%: 14.2 % (ref 14.0–49.0)
MCH: 28.8 pg (ref 27.2–33.4)
MCHC: 33.1 g/dL (ref 32.0–36.0)
MCV: 86.9 fL (ref 79.3–98.0)
MONO#: 0.5 10*3/uL (ref 0.1–0.9)
MONO%: 13.8 % (ref 0.0–14.0)
NEUT#: 2.7 10*3/uL (ref 1.5–6.5)
NEUT%: 68.9 % (ref 39.0–75.0)
Platelets: 224 10*3/uL (ref 140–400)
RBC: 4.45 10*6/uL (ref 4.20–5.82)
RDW: 14.5 % (ref 11.0–14.6)
WBC: 3.9 10*3/uL — ABNORMAL LOW (ref 4.0–10.3)
lymph#: 0.6 10*3/uL — ABNORMAL LOW (ref 0.9–3.3)

## 2013-06-25 LAB — COMPREHENSIVE METABOLIC PANEL (CC13)
ALT: 11 U/L (ref 0–55)
AST: 18 U/L (ref 5–34)
Albumin: 3.9 g/dL (ref 3.5–5.0)
Alkaline Phosphatase: 139 U/L (ref 40–150)
Anion Gap: 9 mEq/L (ref 3–11)
BUN: 10.6 mg/dL (ref 7.0–26.0)
CO2: 27 mEq/L (ref 22–29)
Calcium: 9.1 mg/dL (ref 8.4–10.4)
Chloride: 107 mEq/L (ref 98–109)
Creatinine: 0.9 mg/dL (ref 0.7–1.3)
Glucose: 83 mg/dl (ref 70–140)
Potassium: 3.8 mEq/L (ref 3.5–5.1)
Sodium: 142 mEq/L (ref 136–145)
Total Bilirubin: 0.29 mg/dL (ref 0.20–1.20)
Total Protein: 6.7 g/dL (ref 6.4–8.3)

## 2013-06-25 MED ORDER — SODIUM CHLORIDE 0.9 % IV SOLN
Freq: Once | INTRAVENOUS | Status: AC
  Administered 2013-06-25: 10:00:00 via INTRAVENOUS

## 2013-06-25 MED ORDER — DEXAMETHASONE SODIUM PHOSPHATE 10 MG/ML IJ SOLN
10.0000 mg | Freq: Once | INTRAMUSCULAR | Status: AC
  Administered 2013-06-25: 10 mg via INTRAVENOUS

## 2013-06-25 MED ORDER — DEXAMETHASONE SODIUM PHOSPHATE 10 MG/ML IJ SOLN
INTRAMUSCULAR | Status: AC
Start: 2013-06-25 — End: 2013-06-25
  Filled 2013-06-25: qty 1

## 2013-06-25 MED ORDER — ONDANSETRON 8 MG/50ML IVPB (CHCC)
8.0000 mg | Freq: Once | INTRAVENOUS | Status: AC
  Administered 2013-06-25: 8 mg via INTRAVENOUS

## 2013-06-25 MED ORDER — CYANOCOBALAMIN 1000 MCG/ML IJ SOLN
1000.0000 ug | Freq: Once | INTRAMUSCULAR | Status: AC
  Administered 2013-06-25: 1000 ug via INTRAMUSCULAR

## 2013-06-25 MED ORDER — ONDANSETRON 8 MG/NS 50 ML IVPB
INTRAVENOUS | Status: AC
  Filled 2013-06-25: qty 8

## 2013-06-25 MED ORDER — PEMETREXED DISODIUM CHEMO INJECTION 500 MG
500.0000 mg/m2 | Freq: Once | INTRAVENOUS | Status: AC
  Administered 2013-06-25: 900 mg via INTRAVENOUS
  Filled 2013-06-25: qty 36

## 2013-06-25 MED ORDER — CYANOCOBALAMIN 1000 MCG/ML IJ SOLN
INTRAMUSCULAR | Status: AC
  Filled 2013-06-25: qty 1

## 2013-06-25 NOTE — Progress Notes (Addendum)
[name]   [phone]  SHARED VISIT PROGRESS NOTE  No PCP Per Patient No address on file  DIAGNOSIS: Metastatic non-small cell lung cancer, adenocarcinoma with unknown status of the EGFR mutation or ALK gene translocation secondary to insufficient material, diagnosed in April 2014.   PRIOR THERAPY:  1) stereotactic radiotherapy under the care of Dr. Tammi Klippel completed on 07/18/2012.  2) palliative radiotherapy to the left upper lobe and mediastinal lymph nodes. Completed 09/10/2012.  3) Systemic chemotherapy with carboplatin for AUC of 5 and Alimta 500 mg/M2 giving every 3 weeks. First dose was given on 07/15/2012. Status post 6 cycles, with partial response. 4) stereotactic radiotherapy to the 7 new brain lesions under the care of Dr. Tammi Klippel on 01/31/2013 and 02/05/2013.  CURRENT THERAPY:  1) systemic chemotherapy with single agent Alimta 500 mg/M2 every 3 weeks, status post 2 cycles. First dose on 04/23/2013.  CHEMOTHERAPY INTENT: Palliative  CURRENT # OF CHEMOTHERAPY CYCLES: 3  CURRENT ANTIEMETICS: Zofran, dexamethasone and Compazine  CURRENT SMOKING STATUS: Current smoker and I strongly advise him to quit smoking  ORAL CHEMOTHERAPY AND CONSENT: None  CURRENT BISPHOSPHONATES USE: None    PAIN MANAGEMENT: 0/10  NARCOTICS INDUCED CONSTIPATION: None  LIVING WILL AND CODE STATUS: Full code  INTERVAL HISTORY: Terrance Clark 38 y.o. male returns to the clinic today for followup visit. The patient was started on systemic chemotherapy with single agent Alimta status post 2 cycles. He reports an episode that occurred yesterday where he experienced sudden onset of numbness starting in his left posterior thighs extending into his foot where his toes "locked up". The episode eventually involve the entire left side of his body where he had no control of his left leg. He fell as a result of having no control of his left leg. He states  the left leg shook lasting for about 5 minutes but the inability to control his left side lasted for approximately 5 hours. He states that he felt something funny in his head and had some double vision at the time.  He was concerned that he had a stroke but did not seek emergency room evaluation. He was able to speak in the did not lose consciousness. Today he is feeling no residual effects from yesterday's episode. He presents today to start cycle 3 of single agent Alimta at 500 mg per meter squared given every 3 weeks. He was to have had a MRI of the thoracic and lumbar spine however this was scheduled in the early to mid afternoon and was not conducive to his work schedule. He did not keep the appointment for the MRI. He continues to be upset about having more metastatic brain lesions after his initial treatment. He mentioned that he felt worse after starting coming to the [facility]. He is still in denial about his disease and the possibility of disease progression even with treatment.  He denied having any pain. The patient denied having any significant chest pain, shortness of breath, cough or hemoptysis. He has occasional dizzy spells. No significant weight loss or night sweats.   MEDICAL HISTORY: Past Medical History  Diagnosis Date  . H/O oral surgery 04/2012  . Cough   . Shortness of breath     with exertion  . Insomnia     d/t pain and takes Goody's PM  . Lung cancer 06/2012    RUL lung  . Hx of radiation therapy 07/16/12- 09/10/12    lower neck  and upper chest region , 63 gray in 35 fx  . 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 Hx of radiation therapy 07/18/12    SRS to brain metastasis    ALLERGIES:  has No Known Allergies.  MEDICATIONS:  Current Outpatient Prescriptions  Medication Sig Dispense Refill  . folic acid (FOLVITE) 1 MG tablet Take 1 mg by mouth daily.      Marland Kitchen oxyCODONE-acetaminophen (PERCOCET/ROXICET) 5-325 MG per tablet Take 1 tablet by mouth every 6 (six) hours as needed for  severe pain.  30 tablet  0  . omeprazole (PRILOSEC) 20 MG capsule Take 20 mg by mouth daily.       No current facility-administered medications for this visit.    SURGICAL HISTORY:  Past Surgical History  Procedure Laterality Date  . Foot surgery    . Mouth surgery    . Video bronchoscopy with endobronchial ultrasound Right 06/19/2012    Procedure: VIDEO BRONCHOSCOPY WITH ENDOBRONCHIAL ULTRASOUND;  Surgeon: Collene Gobble, MD;  Location: [facility];  Service: Pulmonary;  Laterality: Right;    REVIEW OF SYSTEMS:  Constitutional: positive for fatigue Eyes: positive for visual disturbance Ears, nose, mouth, throat, and face: negative Respiratory: negative Cardiovascular: negative Gastrointestinal: negative Genitourinary:negative Integument/breast: negative Hematologic/lymphatic: negative Musculoskeletal:negative Neurological: positive for coordination problems, headaches, weakness and Approximately five-hour episode involving numbness and loss of control of the left side of his body Behavioral/Psych: negative Endocrine: negative Allergic/Immunologic: negative   PHYSICAL EXAMINATION: General appearance: alert, cooperative, fatigued and no distress Head: Normocephalic, without obvious abnormality, atraumatic Neck:  palppable left lower cervical and supraclavicular lymphadenopathy. Lymph nodes: palppable left lower cervical and supraclavicular lymphadenopathy. Resp: clear to auscultation bilaterally Back: symmetric, no curvature. ROM normal. No CVA tenderness. Cardio: regular rate and rhythm, S1, S2 normal, no murmur, click, rub or gallop GI: soft, non-tender; bowel sounds normal; no masses,  no organomegaly Extremities: extremities normal, atraumatic, no cyanosis or edema Neurologic: Alert and oriented X 3, normal strength and tone. Normal symmetric reflexes. Normal coordination and gait  ECOG PERFORMANCE STATUS: 1 - Symptomatic but completely ambulatory  Blood pressure 122/77, pulse  84, temperature 98 F (36.7 C), temperature source Oral, resp. rate 18, height 5' 11"  (1.803 m), weight 140 lb 3.2 oz (63.594 kg).  LABORATORY DATA: Lab Results  Component Value Date   WBC 3.9* 06/25/2013   HGB 12.8* 06/25/2013   HCT 38.7 06/25/2013   MCV 86.9 06/25/2013   PLT 224 06/25/2013      Chemistry      Component Value Date/Time   NA 142 06/25/2013 0825   NA 141 06/19/2012 0800   K 3.8 06/25/2013 0825   K 3.9 06/19/2012 0800   CL 103 09/02/2012 0955   CL 105 06/19/2012 0800   CO2 27 06/25/2013 0825   CO2 25 06/19/2012 0800   BUN 10.6 06/25/2013 0825   BUN 18 06/19/2012 0800   CREATININE 0.9 06/25/2013 0825   CREATININE 0.86 06/19/2012 0800      Component Value Date/Time   CALCIUM 9.1 06/25/2013 0825   CALCIUM 9.5 06/19/2012 0800   ALKPHOS 139 06/25/2013 0825   AST 18 06/25/2013 0825   ALT 11 06/25/2013 0825   BILITOT 0.29 06/25/2013 0825       RADIOGRAPHIC STUDIES:  ASSESSMENT AND PLAN: This is a 38 years old African American male with metastatic non-small cell lung cancer, adenocarcinoma with multiple and recurrent brain metastasis status post systemic chemotherapy with carboplatin and Alimta status post 6 cycles. He also underwent stereotactic  radiotherapy to multiple brain lesions under the care Dr. Tammi Klippel. The patient was discussed with also seen by Dr. Julien Nordmann The patient is currently undergoing systemic chemotherapy with single agent Alimta but he is noncompliant with his scheduled treatment and keeps changing his appointment for the visits and chemotherapy. His neurologic event yesterday is concerning and while a Stroke/mini stroke is in the differential diagnosis we are more concerned with recurrent brain metastasis. We have reached out to Dr. Tammi Klippel to evaluate Mr. Romig as he may need any another MRI of his brain to look for recurrent brain metastasis sooner rather than later. Laboratory data was reviewed in he will proceed with a cycle #3 of his chemotherapy with single agent  Alimta at 500 mg per meter square given every 3 weeks today as scheduled. He will followup in 3 weeks with restaging CT scan of the neck, chest, abdomen and pelvis with contrast to reevaluate his disease. He will work with scheduling to see if his appointments and restaging CT scans can be scheduled to better coincide with his work schedule He understands that his treatment is every 3 weeks. Patient was again advised to quit smoking.   He was advised to call immediately if he has any concerning symptoms in the interval.  The patient voices understanding of current disease status and treatment options and is in agreement with the current care plan.  All questions were answered. The patient knows to call the clinic with any problems, questions or concerns. We can certainly see the patient much sooner if necessary.  Carlton Adam, PA-C  ADDENDUM: Hematology/Oncology Attending: I had a face to face encounter with the patient. I recommended his care plan. This is a very pleasant 38 years old [place] American male with metastatic non-small cell lung cancer, adenocarcinoma currently undergoing systemic chemotherapy with single agent Alimta status post 2 cycles.  The patient is tolerating his treatment fairly well with no significant adverse effects. I recommended for him to proceed with cycle #3 today as scheduled. He would come back for follow up visit in 3 weeks after repeating CT scan of the chest, abdomen and pelvis for restaging of his disease. He was advised to call immediately if he has any concerning symptoms in the interval.  Disclaimer: This note was dictated with voice recognition software. Similar sounding words can inadvertently be transcribed and may not be corrected upon review. Curt Bears, MD 06/28/2013

## 2013-06-25 NOTE — Patient Instructions (Signed)
[facility] Discharge Instructions for Patients Receiving Chemotherapy  Today you received the following chemotherapy agents alimta.   To help prevent nausea and vomiting after your treatment, we encourage you to take your nausea medication as directed.    If you develop nausea and vomiting that is not controlled by your nausea medication, call the clinic.   BELOW ARE SYMPTOMS THAT SHOULD BE REPORTED IMMEDIATELY:  *FEVER GREATER THAN 100.5 F  *CHILLS WITH OR WITHOUT FEVER  NAUSEA AND VOMITING THAT IS NOT CONTROLLED WITH YOUR NAUSEA MEDICATION  *UNUSUAL SHORTNESS OF BREATH  *UNUSUAL BRUISING OR BLEEDING  TENDERNESS IN MOUTH AND THROAT WITH OR WITHOUT PRESENCE OF ULCERS  *URINARY PROBLEMS  *BOWEL PROBLEMS  UNUSUAL RASH Items with * indicate a potential emergency and should be followed up as soon as possible.  Feel free to call the clinic you have any questions or concerns. The clinic phone number is [phone].

## 2013-06-25 NOTE — Telephone Encounter (Signed)
Gave pt appt for lab ,md and chemo for April and May 2015

## 2013-06-25 NOTE — Progress Notes (Signed)
  Radiation Oncology         [phone] ________________________________  Name: Terrance Clark  MRN: [id number]  Date: 06/25/2013  DOB: 05-08-1975  Chart Note:  I got a call that this patient had an episode of left sided numbness yesterday.  Because of episodes like this, I had ordered spinal MRI imaging.  But, the patient did not have the MRI done.  I will try again to get spinal MRI and add brain MRI to assess his symptoms.  ________________________________  Sheral Apley Tammi Klippel, M.D.

## 2013-06-26 NOTE — Patient Instructions (Signed)
Dear neurologic event that she described is concerning and we will have Dr. Johny Shears office getting touch with you about further evaluation. Please keep the appointments scheduled to work up this issue. Followup with Dr. Julien Nordmann in 3 weeks with a restaging CT scan of your neck, chest, abdomen and pelvis to reevaluate your disease

## 2013-07-01 ENCOUNTER — Other Ambulatory Visit: Payer: Self-pay | Admitting: *Deleted

## 2013-07-01 ENCOUNTER — Ambulatory Visit
Admission: RE | Admit: 2013-07-01 | Discharge: 2013-07-01 | Disposition: A | Discharge status: Home / Self Care | Payer: 59 | Source: Ambulatory Visit | Attending: Radiation Oncology | Admitting: Radiation Oncology

## 2013-07-01 ENCOUNTER — Other Ambulatory Visit: Payer: Self-pay | Admitting: Radiation Oncology

## 2013-07-01 DIAGNOSIS — C7949 Secondary malignant neoplasm of other parts of nervous system: Principal | ICD-10-CM

## 2013-07-01 DIAGNOSIS — C7931 Secondary malignant neoplasm of brain: Secondary | ICD-10-CM

## 2013-07-01 DIAGNOSIS — C3490 Malignant neoplasm of unspecified part of unspecified bronchus or lung: Secondary | ICD-10-CM

## 2013-07-01 MED ORDER — GADOBENATE DIMEGLUMINE 529 MG/ML IV SOLN: 13.0000 mL | Freq: Once | INTRAVENOUS | Status: DC | PRN

## 2013-07-02 ENCOUNTER — Other Ambulatory Visit (HOSPITAL_BASED_OUTPATIENT_CLINIC_OR_DEPARTMENT_OTHER): Payer: 59

## 2013-07-02 DIAGNOSIS — C7949 Secondary malignant neoplasm of other parts of nervous system: Secondary | ICD-10-CM

## 2013-07-02 DIAGNOSIS — C3490 Malignant neoplasm of unspecified part of unspecified bronchus or lung: Secondary | ICD-10-CM

## 2013-07-02 DIAGNOSIS — C7931 Secondary malignant neoplasm of brain: Secondary | ICD-10-CM

## 2013-07-02 DIAGNOSIS — C3410 Malignant neoplasm of upper lobe, unspecified bronchus or lung: Secondary | ICD-10-CM

## 2013-07-02 LAB — COMPREHENSIVE METABOLIC PANEL (CC13)
ALBUMIN: 4.1 g/dL (ref 3.5–5.0)
ALT: 10 U/L (ref 0–55)
AST: 16 U/L (ref 5–34)
Alkaline Phosphatase: 148 U/L (ref 40–150)
Anion Gap: 11 mEq/L (ref 3–11)
BUN: 19.5 mg/dL (ref 7.0–26.0)
CHLORIDE: 103 meq/L (ref 98–109)
CO2: 23 mEq/L (ref 22–29)
Calcium: 9.5 mg/dL (ref 8.4–10.4)
Creatinine: 1 mg/dL (ref 0.7–1.3)
Glucose: 102 mg/dl (ref 70–140)
POTASSIUM: 3.9 meq/L (ref 3.5–5.1)
SODIUM: 137 meq/L (ref 136–145)
Total Bilirubin: 0.22 mg/dL (ref 0.20–1.20)
Total Protein: 7.1 g/dL (ref 6.4–8.3)

## 2013-07-02 LAB — CBC WITH DIFFERENTIAL/PLATELET
BASO%: 0.6 % (ref 0.0–2.0)
BASOS ABS: 0 10*3/uL (ref 0.0–0.1)
EOS%: 1.2 % (ref 0.0–7.0)
Eosinophils Absolute: 0 10*3/uL (ref 0.0–0.5)
HCT: 39.7 % (ref 38.4–49.9)
HEMOGLOBIN: 13.2 g/dL (ref 13.0–17.1)
LYMPH#: 0.7 10*3/uL — AB (ref 0.9–3.3)
LYMPH%: 22.1 % (ref 14.0–49.0)
MCH: 28.9 pg (ref 27.2–33.4)
MCHC: 33.2 g/dL (ref 32.0–36.0)
MCV: 86.9 fL (ref 79.3–98.0)
MONO#: 0.3 10*3/uL (ref 0.1–0.9)
MONO%: 9.4 % (ref 0.0–14.0)
NEUT#: 2 10*3/uL (ref 1.5–6.5)
NEUT%: 66.7 % (ref 39.0–75.0)
Platelets: 161 10*3/uL (ref 140–400)
RBC: 4.56 10*6/uL (ref 4.20–5.82)
RDW: 13.6 % (ref 11.0–14.6)
WBC: 3.1 10*3/uL — ABNORMAL LOW (ref 4.0–10.3)

## 2013-07-03 ENCOUNTER — Ambulatory Visit: Payer: 59 | Admitting: Radiation Oncology

## 2013-07-04 ENCOUNTER — Ambulatory Visit
Admission: RE | Admit: 2013-07-04 | Discharge: 2013-07-04 | Disposition: A | Discharge status: Home / Self Care | Payer: 59 | Source: Ambulatory Visit | Attending: Radiation Oncology | Admitting: Radiation Oncology

## 2013-07-04 DIAGNOSIS — C7949 Secondary malignant neoplasm of other parts of nervous system: Principal | ICD-10-CM

## 2013-07-04 DIAGNOSIS — C7931 Secondary malignant neoplasm of brain: Secondary | ICD-10-CM

## 2013-07-04 MED ORDER — GADOBENATE DIMEGLUMINE 529 MG/ML IV SOLN
13.0000 mL | Freq: Once | INTRAVENOUS | Status: AC | PRN
Start: 2013-07-04 — End: 2013-07-04

## 2013-07-09 ENCOUNTER — Ambulatory Visit: Payer: 59 | Admitting: Radiation Oncology

## 2013-07-09 ENCOUNTER — Other Ambulatory Visit (HOSPITAL_BASED_OUTPATIENT_CLINIC_OR_DEPARTMENT_OTHER): Payer: 59

## 2013-07-09 DIAGNOSIS — C3410 Malignant neoplasm of upper lobe, unspecified bronchus or lung: Secondary | ICD-10-CM

## 2013-07-09 DIAGNOSIS — C3490 Malignant neoplasm of unspecified part of unspecified bronchus or lung: Secondary | ICD-10-CM

## 2013-07-09 LAB — COMPREHENSIVE METABOLIC PANEL (CC13)
ALBUMIN: 3.7 g/dL (ref 3.5–5.0)
ALK PHOS: 134 U/L (ref 40–150)
ALT: 8 U/L (ref 0–55)
AST: 13 U/L (ref 5–34)
Anion Gap: 13 mEq/L — ABNORMAL HIGH (ref 3–11)
BUN: 12.9 mg/dL (ref 7.0–26.0)
CO2: 22 mEq/L (ref 22–29)
Calcium: 9.3 mg/dL (ref 8.4–10.4)
Chloride: 106 mEq/L (ref 98–109)
Creatinine: 1.1 mg/dL (ref 0.7–1.3)
GLUCOSE: 103 mg/dL (ref 70–140)
POTASSIUM: 3.7 meq/L (ref 3.5–5.1)
SODIUM: 141 meq/L (ref 136–145)
Total Protein: 6.6 g/dL (ref 6.4–8.3)

## 2013-07-09 LAB — CBC WITH DIFFERENTIAL/PLATELET
BASO%: 0.2 % (ref 0.0–2.0)
Basophils Absolute: 0 10*3/uL (ref 0.0–0.1)
EOS ABS: 0.1 10*3/uL (ref 0.0–0.5)
EOS%: 1.6 % (ref 0.0–7.0)
HCT: 36.7 % — ABNORMAL LOW (ref 38.4–49.9)
HGB: 12.1 g/dL — ABNORMAL LOW (ref 13.0–17.1)
LYMPH%: 16.7 % (ref 14.0–49.0)
MCH: 28.9 pg (ref 27.2–33.4)
MCHC: 33.1 g/dL (ref 32.0–36.0)
MCV: 87.4 fL (ref 79.3–98.0)
MONO#: 0.9 10*3/uL (ref 0.1–0.9)
MONO%: 15.8 % — AB (ref 0.0–14.0)
NEUT%: 65.7 % (ref 39.0–75.0)
NEUTROS ABS: 3.6 10*3/uL (ref 1.5–6.5)
PLATELETS: 172 10*3/uL (ref 140–400)
RBC: 4.2 10*6/uL (ref 4.20–5.82)
RDW: 14.4 % (ref 11.0–14.6)
WBC: 5.4 10*3/uL (ref 4.0–10.3)
lymph#: 0.9 10*3/uL (ref 0.9–3.3)

## 2013-07-10 ENCOUNTER — Other Ambulatory Visit: Payer: Self-pay | Admitting: Radiation Therapy

## 2013-07-11 ENCOUNTER — Ambulatory Visit (HOSPITAL_COMMUNITY): Payer: 59

## 2013-07-14 ENCOUNTER — Other Ambulatory Visit: Payer: Self-pay | Admitting: Radiation Therapy

## 2013-07-14 ENCOUNTER — Ambulatory Visit (HOSPITAL_COMMUNITY): Payer: 59

## 2013-07-14 DIAGNOSIS — C7949 Secondary malignant neoplasm of other parts of nervous system: Principal | ICD-10-CM

## 2013-07-14 DIAGNOSIS — C7931 Secondary malignant neoplasm of brain: Secondary | ICD-10-CM

## 2013-07-15 ENCOUNTER — Ambulatory Visit (HOSPITAL_COMMUNITY)
Admission: RE | Admit: 2013-07-15 | Discharge: 2013-07-15 | Disposition: A | Discharge status: Home / Self Care | Payer: 59 | Source: Ambulatory Visit | Attending: Physician Assistant | Admitting: Physician Assistant

## 2013-07-15 ENCOUNTER — Encounter (HOSPITAL_COMMUNITY): Payer: Self-pay

## 2013-07-15 DIAGNOSIS — E279 Disorder of adrenal gland, unspecified: Secondary | ICD-10-CM | POA: Insufficient documentation

## 2013-07-15 DIAGNOSIS — J438 Other emphysema: Secondary | ICD-10-CM | POA: Insufficient documentation

## 2013-07-15 DIAGNOSIS — C3490 Malignant neoplasm of unspecified part of unspecified bronchus or lung: Secondary | ICD-10-CM | POA: Insufficient documentation

## 2013-07-15 DIAGNOSIS — Y842 Radiological procedure and radiotherapy as the cause of abnormal reaction of the patient, or of later complication, without mention of misadventure at the time of the procedure: Secondary | ICD-10-CM | POA: Insufficient documentation

## 2013-07-15 DIAGNOSIS — Z9221 Personal history of antineoplastic chemotherapy: Secondary | ICD-10-CM | POA: Insufficient documentation

## 2013-07-15 DIAGNOSIS — Z923 Personal history of irradiation: Secondary | ICD-10-CM | POA: Insufficient documentation

## 2013-07-15 DIAGNOSIS — C7949 Secondary malignant neoplasm of other parts of nervous system: Secondary | ICD-10-CM

## 2013-07-15 DIAGNOSIS — C7931 Secondary malignant neoplasm of brain: Secondary | ICD-10-CM | POA: Insufficient documentation

## 2013-07-15 DIAGNOSIS — T66XXXA Radiation sickness, unspecified, initial encounter: Secondary | ICD-10-CM | POA: Insufficient documentation

## 2013-07-15 DIAGNOSIS — R599 Enlarged lymph nodes, unspecified: Secondary | ICD-10-CM | POA: Insufficient documentation

## 2013-07-15 MED ORDER — IOHEXOL 300 MG/ML  SOLN
50.0000 mL | Freq: Once | INTRAMUSCULAR | Status: AC | PRN
  Administered 2013-07-15: 50 mL via ORAL

## 2013-07-15 MED ORDER — IOHEXOL 300 MG/ML  SOLN
100.0000 mL | Freq: Once | INTRAMUSCULAR | Status: AC | PRN
  Administered 2013-07-15: 100 mL via INTRAVENOUS

## 2013-07-16 ENCOUNTER — Encounter: Payer: Self-pay | Admitting: Internal Medicine

## 2013-07-16 ENCOUNTER — Telehealth: Payer: Self-pay | Admitting: Internal Medicine

## 2013-07-16 ENCOUNTER — Ambulatory Visit (HOSPITAL_BASED_OUTPATIENT_CLINIC_OR_DEPARTMENT_OTHER): Payer: 59 | Admitting: Internal Medicine

## 2013-07-16 ENCOUNTER — Other Ambulatory Visit: Payer: Self-pay | Admitting: *Deleted

## 2013-07-16 ENCOUNTER — Ambulatory Visit: Payer: 59

## 2013-07-16 ENCOUNTER — Other Ambulatory Visit (HOSPITAL_BASED_OUTPATIENT_CLINIC_OR_DEPARTMENT_OTHER): Payer: 59

## 2013-07-16 VITALS — BP 142/84 | HR 66 | Temp 97.9°F | Resp 18 | Ht 71.0 in | Wt 138.2 lb

## 2013-07-16 DIAGNOSIS — C3410 Malignant neoplasm of upper lobe, unspecified bronchus or lung: Secondary | ICD-10-CM

## 2013-07-16 DIAGNOSIS — C7931 Secondary malignant neoplasm of brain: Secondary | ICD-10-CM

## 2013-07-16 DIAGNOSIS — C7949 Secondary malignant neoplasm of other parts of nervous system: Secondary | ICD-10-CM

## 2013-07-16 DIAGNOSIS — E279 Disorder of adrenal gland, unspecified: Secondary | ICD-10-CM

## 2013-07-16 DIAGNOSIS — C778 Secondary and unspecified malignant neoplasm of lymph nodes of multiple regions: Secondary | ICD-10-CM

## 2013-07-16 DIAGNOSIS — C3490 Malignant neoplasm of unspecified part of unspecified bronchus or lung: Secondary | ICD-10-CM

## 2013-07-16 LAB — CBC WITH DIFFERENTIAL/PLATELET
BASO%: 0 % (ref 0.0–2.0)
Basophils Absolute: 0 10*3/uL (ref 0.0–0.1)
EOS ABS: 0 10*3/uL (ref 0.0–0.5)
EOS%: 0.2 % (ref 0.0–7.0)
HEMATOCRIT: 40.8 % (ref 38.4–49.9)
HGB: 13.4 g/dL (ref 13.0–17.1)
LYMPH%: 14.8 % (ref 14.0–49.0)
MCH: 28.9 pg (ref 27.2–33.4)
MCHC: 32.8 g/dL (ref 32.0–36.0)
MCV: 88.1 fL (ref 79.3–98.0)
MONO#: 0.8 10*3/uL (ref 0.1–0.9)
MONO%: 10.5 % (ref 0.0–14.0)
NEUT%: 74.5 % (ref 39.0–75.0)
NEUTROS ABS: 5.8 10*3/uL (ref 1.5–6.5)
PLATELETS: 260 10*3/uL (ref 140–400)
RBC: 4.63 10*6/uL (ref 4.20–5.82)
RDW: 14.6 % (ref 11.0–14.6)
WBC: 7.8 10*3/uL (ref 4.0–10.3)
lymph#: 1.2 10*3/uL (ref 0.9–3.3)

## 2013-07-16 LAB — COMPREHENSIVE METABOLIC PANEL (CC13)
ALK PHOS: 129 U/L (ref 40–150)
ALT: 30 U/L (ref 0–55)
AST: 17 U/L (ref 5–34)
Albumin: 3.8 g/dL (ref 3.5–5.0)
Anion Gap: 12 mEq/L — ABNORMAL HIGH (ref 3–11)
BILIRUBIN TOTAL: 0.2 mg/dL (ref 0.20–1.20)
BUN: 18.3 mg/dL (ref 7.0–26.0)
CO2: 26 meq/L (ref 22–29)
CREATININE: 0.9 mg/dL (ref 0.7–1.3)
Calcium: 9.4 mg/dL (ref 8.4–10.4)
Chloride: 102 mEq/L (ref 98–109)
GLUCOSE: 85 mg/dL (ref 70–140)
Potassium: 3.6 mEq/L (ref 3.5–5.1)
Sodium: 140 mEq/L (ref 136–145)
Total Protein: 6.7 g/dL (ref 6.4–8.3)

## 2013-07-16 MED ORDER — FOLIC ACID 1 MG PO TABS: 1.0000 mg | ORAL_TABLET | Freq: Every day | ORAL | Status: DC

## 2013-07-16 NOTE — Patient Instructions (Signed)
Smoking Cessation, Tips for Success If you are ready to quit smoking, congratulations! You have chosen to help yourself be healthier. Cigarettes bring nicotine, tar, carbon monoxide, and other irritants into your body. Your lungs, heart, and blood vessels will be able to work better without these poisons. There are many different ways to quit smoking. Nicotine gum, nicotine patches, a nicotine inhaler, or nicotine nasal spray can help with physical craving. Hypnosis, support groups, and medicines help break the habit of smoking. WHAT THINGS CAN I DO TO MAKE QUITTING EASIER?  Here are some tips to help you quit for good:  Pick a date when you will quit smoking completely. Tell all of your friends and family about your plan to quit on that date.  Do not try to slowly cut down on the number of cigarettes you are smoking. Pick a quit date and quit smoking completely starting on that day.  Throw away all cigarettes.   Clean and remove all ashtrays from your home, work, and car.   On a card, write down your reasons for quitting. Carry the card with you and read it when you get the urge to smoke.   Cleanse your body of nicotine. Drink enough water and fluids to keep your urine clear or pale yellow. Do this after quitting to flush the nicotine from your body.   Learn to predict your moods. Do not let a bad situation be your excuse to have a cigarette. Some situations in your life might tempt you into wanting a cigarette.   Never have "just one" cigarette. It leads to wanting another and another. Remind yourself of your decision to quit.   Change habits associated with smoking. If you smoked while driving or when feeling stressed, try other activities to replace smoking. Stand up when drinking your coffee. Brush your teeth after eating. Sit in a different chair when you read the paper. Avoid alcohol while trying to quit, and try to drink fewer caffeinated beverages. Alcohol and caffeine may urge  you to smoke.   Avoid foods and drinks that can trigger a desire to smoke, such as sugary or spicy foods and alcohol.   Ask people who smoke not to smoke around you.   Have something planned to do right after eating or having a cup of coffee. For example, plan to take a walk or exercise.   Try a relaxation exercise to calm you down and decrease your stress. Remember, you may be tense and nervous for the first 2 weeks after you quit, but this will pass.   Find new activities to keep your hands busy. Play with a pen, coin, or rubber band. Doodle or draw things on paper.   Brush your teeth right after eating. This will help cut down on the craving for the taste of tobacco after meals. You can also try mouthwash.   Use oral substitutes in place of cigarettes. Try using lemon drops, carrots, cinnamon sticks, or chewing gum. Keep them handy so they are available when you have the urge to smoke.   When you have the urge to smoke, try deep breathing.   Designate your home as a nonsmoking area.   If you are a heavy smoker, ask your health care provider about a prescription for nicotine chewing gum. It can ease your withdrawal from nicotine.   Reward yourself. Set aside the cigarette money you save and buy yourself something nice.   Look for support from others. Join a support group or   smoking cessation program. Ask someone at home or at work to help you with your plan to quit smoking.   Always ask yourself, "Do I need this cigarette or is this just a reflex?" Tell yourself, "Today, I choose not to smoke," or "I do not want to smoke." You are reminding yourself of your decision to quit.  Do not replace cigarette smoking with electronic cigarettes (commonly called e-cigarettes). The safety of e-cigarettes is unknown, and some may contain harmful chemicals.  If you relapse, do not give up! Plan ahead and think about what you will do the next time you get the urge to smoke.  HOW WILL  I FEEL WHEN I QUIT SMOKING? You may have symptoms of withdrawal because your body is used to nicotine (the addictive substance in cigarettes). You may crave cigarettes, be irritable, feel very hungry, cough often, get headaches, or have difficulty concentrating. The withdrawal symptoms are only temporary. They are strongest when you first quit but will go away within 10 14 days. When withdrawal symptoms occur, stay in control. Think about your reasons for quitting. Remind yourself that these are signs that your body is healing and getting used to being without cigarettes. Remember that withdrawal symptoms are easier to treat than the major diseases that smoking can cause.  Even after the withdrawal is over, expect periodic urges to smoke. However, these cravings are generally short lived and will go away whether you smoke or not. Do not smoke!  WHAT RESOURCES ARE AVAILABLE TO HELP ME QUIT SMOKING? Your health care provider can direct you to community resources or hospitals for support, which may include:  Group support.  Education.  Hypnosis.  Therapy. Document Released: 11/26/2003 Document Revised: 12/18/2012 Document Reviewed: 08/15/2012 [employer] Patient Information 2014 [employer].  

## 2013-07-16 NOTE — Progress Notes (Signed)
[place] [phone]   [phone]  OFFICE PROGRESS NOTE  No PCP Per Patient No address on file  DIAGNOSIS: Metastatic non-small cell lung cancer, adenocarcinoma with unknown status of the EGFR mutation or ALK gene translocation secondary to insufficient material, diagnosed in April 2014.   PRIOR THERAPY:  1) stereotactic radiotherapy under the care of Dr. Tammi Klippel completed on 07/18/2012.  2) palliative radiotherapy to the left upper lobe and mediastinal lymph nodes. Completed 09/10/2012.  3) Systemic chemotherapy with carboplatin for AUC of 5 and Alimta 500 mg/M2 giving every 3 weeks. First dose was given on 07/15/2012. Status post 6 cycles, with partial response. 4) stereotactic radiotherapy to the 7 new brain lesions under the care of Dr. Tammi Klippel on 01/31/2013 and 02/05/2013.  CURRENT THERAPY:  1) systemic chemotherapy with single agent Alimta 500 mg/M2 every 3 weeks, status post 3 cycle. First dose on 04/23/2013.  CHEMOTHERAPY INTENT: Palliative  CURRENT # OF CHEMOTHERAPY CYCLES: 4  CURRENT ANTIEMETICS: Zofran, dexamethasone and Compazine  CURRENT SMOKING STATUS: Current smoker and I strongly advise him to quit smoking  ORAL CHEMOTHERAPY AND CONSENT: None  CURRENT BISPHOSPHONATES USE: None    PAIN MANAGEMENT: 0/10  NARCOTICS INDUCED CONSTIPATION: None  LIVING WILL AND CODE STATUS: Full code  INTERVAL HISTORY: Terrance Clark 38 y.o. male returns to the clinic today for followup visit. The patient tolerated the third cycle of his systemic chemotherapy with single agent Alimta fairly well with no significant adverse effects. He does not take his folic acid as prescribed. He denied having any pain. The patient denied having any significant chest pain, shortness of breath, cough or hemoptysis. He has occasional dizzy spells. No significant weight loss or night sweats. He has no nausea or vomiting. He had repeat CT scan of the chest, abdomen  and pelvis performed recently and he is here for evaluation and discussion of his scan results.  MEDICAL HISTORY: Past Medical History  Diagnosis Date  . H/O oral surgery 04/2012  . Cough   . Shortness of breath     with exertion  . Insomnia     d/t pain and takes Goody's PM  . Hx of radiation therapy 07/16/12- 09/10/12    lower neck and upper chest region , 63 gray in 35 fx  . Hx of radiation therapy 07/18/12    SRS to brain metastasis  . Lung cancer 06/2012    RUL lung  . 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  MEDICATIONS:  Current Outpatient Prescriptions  Medication Sig Dispense Refill  . folic acid (FOLVITE) 1 MG tablet Take 1 tablet (1 mg total) by mouth daily.  30 tablet  1  . omeprazole (PRILOSEC) 20 MG capsule Take 20 mg by mouth daily.      Marland Kitchen oxyCODONE-acetaminophen (PERCOCET/ROXICET) 5-325 MG per tablet Take 1 tablet by mouth every 6 (six) hours as needed for severe pain.  30 tablet  0  . UNABLE TO FIND Pt is taking a steroid but he is not sure what dose or name of drug.  Rx by Dr Vertell Limber       No current facility-administered medications for this visit.    SURGICAL HISTORY:  Past Surgical History  Procedure Laterality Date  . Foot surgery    . Mouth surgery    . Video bronchoscopy with endobronchial ultrasound Right 06/19/2012    Procedure: VIDEO BRONCHOSCOPY WITH ENDOBRONCHIAL ULTRASOUND;  Surgeon: Collene Gobble, MD;  Location: [facility] OR;  Service: Pulmonary;  Laterality: Right;    REVIEW OF SYSTEMS:  Constitutional: positive for fatigue Eyes: negative Ears, nose, mouth, throat, and face: negative Respiratory: negative Cardiovascular: negative Gastrointestinal: negative Genitourinary:negative Integument/breast: negative Hematologic/lymphatic: negative Musculoskeletal:negative Neurological: negative Behavioral/Psych: negative Endocrine: negative Allergic/Immunologic: negative   PHYSICAL EXAMINATION: General appearance: alert,  cooperative, fatigued and no distress Head: Normocephalic, without obvious abnormality, atraumatic Neck:  palppable left lower cervical and supraclavicular lymphadenopathy. Lymph nodes: palppable left lower cervical and supraclavicular lymphadenopathy. Resp: clear to auscultation bilaterally Back: symmetric, no curvature. ROM normal. No CVA tenderness. Cardio: regular rate and rhythm, S1, S2 normal, no murmur, click, rub or gallop GI: soft, non-tender; bowel sounds normal; no masses,  no organomegaly Extremities: extremities normal, atraumatic, no cyanosis or edema Neurologic: Alert and oriented X 3, normal strength and tone. Normal symmetric reflexes. Normal coordination and gait  ECOG PERFORMANCE STATUS: 1 - Symptomatic but completely ambulatory  Blood pressure 142/84, pulse 66, temperature 97.9 F (36.6 C), temperature source Oral, resp. rate 18, height 5' 11"  (1.803 m), weight 138 lb 3.2 oz (62.687 kg), SpO2 100.00%.  LABORATORY DATA: Lab Results  Component Value Date   WBC 5.4 07/09/2013   HGB 12.1* 07/09/2013   HCT 36.7* 07/09/2013   MCV 87.4 07/09/2013   PLT 172 07/09/2013      Chemistry      Component Value Date/Time   NA 141 07/09/2013 0815   NA 141 06/19/2012 0800   K 3.7 07/09/2013 0815   K 3.9 06/19/2012 0800   CL 103 09/02/2012 0955   CL 105 06/19/2012 0800   CO2 22 07/09/2013 0815   CO2 25 06/19/2012 0800   BUN 12.9 07/09/2013 0815   BUN 18 06/19/2012 0800   CREATININE 1.1 07/09/2013 0815   CREATININE 0.86 06/19/2012 0800      Component Value Date/Time   CALCIUM 9.3 07/09/2013 0815   CALCIUM 9.5 06/19/2012 0800   ALKPHOS 134 07/09/2013 0815   AST 13 07/09/2013 0815   ALT 8 07/09/2013 0815   BILITOT <0.20 07/09/2013 0815       RADIOGRAPHIC STUDIES: Ct Soft Tissue Neck W Contrast  07/15/2013   CLINICAL DATA:  Restaging.  Non-small-cell lung cancer.  EXAM: CT NECK WITH CONTRAST  TECHNIQUE: Multidetector CT imaging of the neck was performed using the standard protocol following the  bolus administration of intravenous contrast.  CONTRAST:  41m OMNIPAQUE IOHEXOL 300 MG/ML SOLN, 1032mOMNIPAQUE IOHEXOL 300 MG/ML SOLN  COMPARISON:  05/07/2013  FINDINGS: Patient positioning is different than on the previous study. This makes direct comparison somewhat challenging. Level 5B lymphadenopathy on the left appears quite similar to the previous study. Previous index node at the level of the glottis measures 12 mm as seen previously. I do not believe that there are new or obviously enlarging lymph nodes. The remainder of the neck region appears normal. Parotid, submandibular and thyroid glands are normal. No mucosal or submucosal lesion. No sign of osseous metastatic disease.  IMPRESSION: Persistent level 5 B (supraclavicular) lymphadenopathy on the left. Allowing for positioning differences, I do not believe there has been any change since the study of 05/07/2013.   Electronically Signed   By: MaNelson Chimes.D.   On: 07/15/2013 10:47   Ct Chest W Contrast  07/15/2013   CLINICAL DATA:  Non-small-cell right lung cancer with brain metastases, XRT and chemotherapy ongoing  EXAM: CT CHEST, ABDOMEN, AND PELVIS WITH CONTRAST  TECHNIQUE: Multidetector CT imaging of the chest, abdomen and  pelvis was performed following the standard protocol during bolus administration of intravenous contrast.  CONTRAST:  149m OMNIPAQUE IOHEXOL 300 MG/ML  SOLN  COMPARISON:  04/09/2013  FINDINGS: CT CHEST FINDINGS  Prior spiculated posterior right upper lobe nodule has resolved.  Additional bilateral upper lobe nodularity has essentially resolved, either infectious/inflammatory or reflecting treated metastasis.  Residual 1-2 mm subpleural nodules in the posterior right upper lobe (series 5/ images 16 and 26) and lateral right upper lobe (series 5/image 28). This appearance/distribution is not suspicious for metastatic disease.  Radiation changes in the right upper lobe. Moderate centrilobular and paraseptal emphysematous changes  at the lung apices. Mild scarring at the left lung base. No pleural effusion or pneumothorax.  Visualized thyroid is unremarkable.  The heart is normal in size. No pericardial effusion. Focal narrowing of the SVC (series 2/ image 21), which remains patent.  Right paratracheal soft tissue measures 8 mm short axis (series 2/image 19), previously 10 mm. No suspicious hilar or axillary lymphadenopathy.  Visualized osseous structures are within normal limits.  CT ABDOMEN AND PELVIS FINDINGS  Liver, spleen, pancreas, and left adrenal gland are within normal limits.  2.9 x 1.5 cm right adrenal mass (series 2/image 61), new, suspicious for metastasis.  Gallbladder is unremarkable. No intrahepatic or extrahepatic ductal dilatation.  Kidneys are within normal limits.  No hydronephrosis.  No evidence of bowel obstruction.  No evidence of abdominal aortic aneurysm.  No abdominopelvic ascites.  No suspicious abdominopelvic lymphadenopathy.  Prostate is unremarkable.  Bladder is within normal limits.  Visualized osseous structures are within normal limits.  IMPRESSION: Prior spiculated right upper lobe nodule has resolved.  Additional bilateral upper lobe nodularity has essentially resolved, with residual tiny 1-2 mm subpleural nodules which are favored to be benign.  8 mm short axis right paratracheal node, mildly improved.  2.9 x 1.5 cm right adrenal mass, new, suspicious for metastasis.   Electronically Signed   By: SJulian HyM.D.   On: 07/15/2013 12:57   Mr BJeri CosWFBContrast  07/04/2013   CLINICAL DATA:  Followup stereotactic radiation. Non small cell lung cancer.  EXAM: MRI HEAD WITHOUT AND WITH CONTRAST  TECHNIQUE: Multiplanar, multiecho pulse sequences of the brain and surrounding structures were obtained without and with intravenous contrast.  CONTRAST:  MultiHance 13 mL.  COMPARISON:  Multiple priors, most recent 04/18/2013.  FINDINGS: There has been stereotactic treatment of multiple previously documented  intracranial metastatic deposits. Most of these are stable or improved except for the following:  - undersurface of the right occipital lobe, 3 mm, image 71. New lesion.  - left parietal white matter, 2 mm, image 106.  New lesion.  - right posterior frontal cortex, 23 x 17 x 17 mm, marked increase vasogenic edema, image 132. Increased in size from prior exam which measured 18 x 14 x 12 mm.  IMPRESSION: Progression of intracranial metastatic disease as described. Worsening of one previously identified lesion, and development of two new lesions.   Electronically Signed   By: JRolla FlattenM.D.   On: 07/04/2013 11:10   Mr Thoracic Spine W Wo Contrast  07/04/2013   CLINICAL DATA:  Lung cancer with thoracic spine metastases.  EXAM: MRI THORACIC SPINE WITHOUT AND WITH CONTRAST  TECHNIQUE: Multiplanar and multiecho pulse sequences of the thoracic spine were obtained without and with intravenous contrast.  CONTRAST:  13 mL MultiHance.  COMPARISON:  None.  FINDINGS: The thoracic spinal alignment is anatomic. The thoracic cord appears normal. There are no  intra medullary lesions. No evidence of dural metastatic disease. The bone marrow signal is normal with the exception of the left side of T3 in the vertebral body, pedicle and pars interarticularis. This shows decreased T1 weighted marrow signal the on precontrast imaging and the enhancement on post gadolinium imaging (image 8 series 3). The extension of the posterior elements makes this suspicious for early metastatic disease. There is no extraosseous extension and no other foci of metastatic disease are identified. Paraspinal soft tissues demonstrate scarring in the lungs.  There is no thoracic spine degenerative disease. The alignment is within normal limits.  IMPRESSION: Ill-defined effacement of normal fatty marrow and post gadolinium enhancement centered around the pedicle of T3 on the left is suspicious for early metastatic disease. No other foci of thoracic spine  metastatic disease are identified.   Electronically Signed   By: Dereck Ligas M.D.   On: 07/04/2013 10:52   Mr Lumbar Spine W Wo Contrast  07/04/2013   CLINICAL DATA:  Lung cancer. leg weakness. Numbness. Falls.  EXAM: MRI LUMBAR SPINE WITHOUT AND WITH CONTRAST  TECHNIQUE: Multiplanar and multiecho pulse sequences of the lumbar spine were obtained without and with intravenous contrast.  CONTRAST:  13 mL MultiHance.  COMPARISON:  12/18/2012.  FINDINGS: Straightening of the normal lumbar lordosis. Five lumbar type vertebral bodies are present. Vertebral body height is preserved. There is no evidence of metastatic disease. No effacement of normal fatty marrow or abnormal post gadolinium enhancement. Spinal cord terminates posterior to the L1-L2 interspace. Intervertebral discs from T12-L1 through L4-L5 are normal.  L5-S1 shows disc desiccation with a right paracentral disc extrusion that shows a small amount of caudal migration of disc material. The central canal remains patent. The extrusion contacts both descending S1 nerves as they approach the lateral recesses but there is no neural compression. L5-S1 neural foramina appears normal.  IMPRESSION: Single level L5-S1 disc degeneration with a right paracentral extrusion. No metastatic disease in the lumbar spine.   Electronically Signed   By: Dereck Ligas M.D.   On: 07/04/2013 11:25   Mr Lumbar Spine W Wo Contrast  07/04/2013   CLINICAL DATA:  Lung cancer.  leg weakness.  Numbness.  Falls.  EXAM: MRI LUMBAR SPINE WITHOUT AND WITH CONTRAST  TECHNIQUE: Multiplanar and multiecho pulse sequences of the lumbar spine were obtained without and with intravenous contrast.  CONTRAST:  13 mL MultiHance.  COMPARISON:  12/18/2012.  FINDINGS: Straightening of the normal lumbar lordosis. Five lumbar type vertebral bodies are present. Vertebral body height is preserved. There is no evidence of metastatic disease. No effacement of normal fatty marrow or abnormal post  gadolinium enhancement. Spinal cord terminates posterior to the L1-L2 interspace. Intervertebral discs from T12-L1 through L4-L5 are normal.  L5-S1 shows disc desiccation with a right paracentral disc extrusion that shows a small amount of caudal migration of disc material. The central canal remains patent. The extrusion contacts both descending S1 nerves as they approach the lateral recesses but there is no neural compression. L5-S1 neural foramina appears normal.  IMPRESSION: Single level L5-S1 disc degeneration with a right paracentral extrusion. No metastatic disease in the lumbar spine.   Electronically Signed   By: Dereck Ligas M.D.   On: 07/04/2013 11:12   ASSESSMENT AND PLAN: This is a 38 years old [place] American male with metastatic non-small cell lung cancer, adenocarcinoma with multiple and recurrent brain metastasis status post systemic chemotherapy with carboplatin and Alimta status post 6 cycles. He also underwent stereotactic  radiotherapy to multiple brain lesions recently.  The patient underwent systemic chemotherapy with single agent Alimta status post 3 cycles and tolerated his treatment well.  His recent CT scan of the chest, abdomen and pelvis showed improvement in his disease in the chest but the patient has new lesion on the right adrenal gland. His MRI of the brain also showed evidence for disease progression. I discussed the scan results with the patient today. I would discontinue the treatment with Alimta at this point and refer the patient to Dr. Tammi Klippel for consideration of palliative radiotherapy to the new right adrenal lesion. I would see the patient back for followup visit in 3 months with repeat CT scan of the chest, abdomen and pelvis for restaging of his disease. He was advised to call immediately if he has any concerning symptoms in the interval. The patient voices understanding of current disease status and treatment options and is in agreement with the current care  plan.  All questions were answered. The patient knows to call the clinic with any problems, questions or concerns. We can certainly see the patient much sooner if necessary.  Disclaimer: This note was dictated with voice recognition software. Similar sounding words can inadvertently be transcribed and may not be corrected upon review.

## 2013-07-16 NOTE — Telephone Encounter (Signed)
gv adn printed appt sched and avs for pt for May June and Aug

## 2013-07-18 ENCOUNTER — Encounter: Payer: Self-pay | Admitting: Radiation Oncology

## 2013-07-18 NOTE — Progress Notes (Signed)
Histology and Location of Primary Cancer: metastatic non-small cell lung cancer  Location(s) of Symptomatic tumor(s): right adrenal gland and progression of brain disease  Past/Anticipated chemotherapy by medical oncology, if any: Alimta discontinued   Patient's main complaints related to symptomatic tumor(s) are: leg weakness, numbness, dizziness, falls  Pain on a scale of 0-10 is:     If Spine Met(s), symptoms, if any, include:  Bowel/Bladder retention or incontinence (please describe): None noted  Numbness or weakness in extremities (please describe): leg weakness  Current Decadron regimen, if applicable: No   Ambulatory status? Walker? Wheelchair?: ambulatory  SAFETY ISSUES:  Prior radiation? YES  Pacemaker/ICD? NO  Possible current pregnancy? NO  Is the patient on methotrexate? NO  Additional Complaints / other details:  38 year old male.

## 2013-07-20 ENCOUNTER — Encounter: Payer: Self-pay | Admitting: Radiation Oncology

## 2013-07-20 NOTE — Progress Notes (Signed)
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 07/16/12-09/10/2012, Lower neck and upper chest region, 63 gray in 35 fractions   2. 07/18/2012 stereotactic radiosurgery for three brain metastasis   Right parietal 21 mm   Right posterior parietal 6 mm   Left thalamic 5 mm   3. 01/31/2013 and 02/05/2013 stereotactic radiosurgery to 7 new lesions to 20 Gy   Right frontal 12 mm   Right parietal 5 mm   Left parietal 4 mm   Left insular 3 mm   Right occipital 3 mm   Left Thalamus 3 mm   Left Occipital 1 mm   4. 04/30/2013 5 new brain metastases were treated with SRS to 20 Gy:   Cerebellar vermis 4 mm   The first Left temporal 3 mm   The second Left temporal 3 mm   Left Occipital 2 mm   Left medial parietal 1 mm    Interval Since Last Radiation:  3  months  Narrative:  The patient returns today to discuss progressive brain metastases.  The recent films were presented in our multidisciplinary conference with neuroradiology just prior to the clinic.  We presented his most recent brain MRI in the multidisciplinary neuro-oncology conference 2 weeks ago. He has 2 new brain metastases less than 5 mm with minimal surrounding edema and one dominant enlarging lesion in the left frontal lobe involving the motor strip with significant surrounding edema. At that time, we decided he should be seen by neurosurgery to evaluate the enlarging right motor strip frontal lesion. The increasing enhancement and surrounding edema were suggestive of either radionecrosis or recurrent tumor. After meeting with neurosurgery, Dr. Vertell Limber and I elected to empirically treat for radionecrosis and edema with  dexamethasone and repeat brain MRI in one month. Since starting steroids last week, the patient's left leg weakness has improved, but he still has some weakness and some clumsiness of the left leg.                             ALLERGIES:  has No Known Allergies.  Meds: Current Outpatient Prescriptions  Medication Sig Dispense Refill  . folic acid (FOLVITE) 1 MG tablet Take 1 tablet (1 mg total) by mouth daily.  30 tablet  1  . omeprazole (PRILOSEC) 20 MG capsule Take 20 mg by mouth daily.      Marland Kitchen oxyCODONE-acetaminophen (PERCOCET/ROXICET) 5-325 MG per tablet Take 1 tablet by mouth every 6 (six) hours as needed for severe pain.  30 tablet  0  . UNABLE TO FIND Pt is taking a steroid but he is not sure what dose or name of drug.  Rx by Dr Vertell Limber       No current facility-administered medications for this encounter.    Physical Findings: The patient is in no acute distress. Patient is alert and oriented.  height is 5\' 11"  (1.803 m) and weight is 136 lb 8 oz (61.916 kg). His oral temperature is 98 F (36.7 C). His blood pressure is 139/83 and his pulse is 96. His respiration is 16 and oxygen saturation is 100%. .  No significant  changes.  Lab Findings: Lab Results  Component Value Date   WBC 7.8 07/16/2013   HGB 13.4 07/16/2013   HCT 40.8 07/16/2013   MCV 88.1 07/16/2013   PLT 260 07/16/2013    @LASTCHEM @  Radiographic Findings: Ct Soft Tissue Neck W Contrast  07/15/2013   CLINICAL DATA:  Restaging.  Non-small-cell lung cancer.  EXAM: CT NECK WITH CONTRAST  TECHNIQUE: Multidetector CT imaging of the neck was performed using the standard protocol following the bolus administration of intravenous contrast.  CONTRAST:  68mL OMNIPAQUE IOHEXOL 300 MG/ML SOLN, 161mL OMNIPAQUE IOHEXOL 300 MG/ML SOLN  COMPARISON:  05/07/2013  FINDINGS: Patient positioning is different than on the previous study. This makes direct comparison somewhat challenging. Level 5B lymphadenopathy on the left appears quite similar to  the previous study. Previous index node at the level of the glottis measures 12 mm as seen previously. I do not believe that there are new or obviously enlarging lymph nodes. The remainder of the neck region appears normal. Parotid, submandibular and thyroid glands are normal. No mucosal or submucosal lesion. No sign of osseous metastatic disease.  IMPRESSION: Persistent level 5 B (supraclavicular) lymphadenopathy on the left. Allowing for positioning differences, I do not believe there has been any change since the study of 05/07/2013.   Electronically Signed   By: Nelson Chimes M.D.   On: 07/15/2013 10:47   Ct Chest W Contrast  07/15/2013   CLINICAL DATA:  Non-small-cell right lung cancer with brain metastases, XRT and chemotherapy ongoing  EXAM: CT CHEST, ABDOMEN, AND PELVIS WITH CONTRAST  TECHNIQUE: Multidetector CT imaging of the chest, abdomen and pelvis was performed following the standard protocol during bolus administration of intravenous contrast.  CONTRAST:  133mL OMNIPAQUE IOHEXOL 300 MG/ML  SOLN  COMPARISON:  04/09/2013  FINDINGS: CT CHEST FINDINGS  Prior spiculated posterior right upper lobe nodule has resolved.  Additional bilateral upper lobe nodularity has essentially resolved, either infectious/inflammatory or reflecting treated metastasis.  Residual 1-2 mm subpleural nodules in the posterior right upper lobe (series 5/ images 16 and 26) and lateral right upper lobe (series 5/image 28). This appearance/distribution is not suspicious for metastatic disease.  Radiation changes in the right upper lobe. Moderate centrilobular and paraseptal emphysematous changes at the lung apices. Mild scarring at the left lung base. No pleural effusion or pneumothorax.  Visualized thyroid is unremarkable.  The heart is normal in size. No pericardial effusion. Focal narrowing of the SVC (series 2/ image 21), which remains patent.  Right paratracheal soft tissue measures 8 mm short axis (series 2/image 19), previously  10 mm. No suspicious hilar or axillary lymphadenopathy.  Visualized osseous structures are within normal limits.  CT ABDOMEN AND PELVIS FINDINGS  Liver, spleen, pancreas, and left adrenal gland are within normal limits.  2.9 x 1.5 cm right adrenal mass (series 2/image 61), new, suspicious for metastasis.  Gallbladder is unremarkable. No intrahepatic or extrahepatic ductal dilatation.  Kidneys are within normal limits.  No hydronephrosis.  No evidence of bowel obstruction.  No evidence of abdominal aortic aneurysm.  No abdominopelvic ascites.  No suspicious abdominopelvic lymphadenopathy.  Prostate is unremarkable.  Bladder is within normal limits.  Visualized osseous structures are within normal limits.  IMPRESSION: Prior spiculated right upper lobe nodule has resolved.  Additional bilateral upper lobe nodularity has essentially resolved, with residual tiny 1-2 mm subpleural nodules which are favored to be benign.  8 mm short axis right paratracheal node, mildly improved.  2.9 x 1.5 cm right adrenal mass,  new, suspicious for metastasis.   Electronically Signed   By: Julian Hy M.D.   On: 07/15/2013 12:57   Mr Jeri Cos AJ Contrast  07/04/2013   CLINICAL DATA:  Followup stereotactic radiation. Non small cell lung cancer.  EXAM: MRI HEAD WITHOUT AND WITH CONTRAST  TECHNIQUE: Multiplanar, multiecho pulse sequences of the brain and surrounding structures were obtained without and with intravenous contrast.  CONTRAST:  MultiHance 13 mL.  COMPARISON:  Multiple priors, most recent 04/18/2013.  FINDINGS: There has been stereotactic treatment of multiple previously documented intracranial metastatic deposits. Most of these are stable or improved except for the following:  - undersurface of the right occipital lobe, 3 mm, image 71. New lesion.  - left parietal white matter, 2 mm, image 106.  New lesion.  - right posterior frontal cortex, 23 x 17 x 17 mm, marked increase vasogenic edema, image 132. Increased in size  from prior exam which measured 18 x 14 x 12 mm.  IMPRESSION: Progression of intracranial metastatic disease as described. Worsening of one previously identified lesion, and development of two new lesions.   Electronically Signed   By: Rolla Flatten M.D.   On: 07/04/2013 11:10   Mr Thoracic Spine W Wo Contrast  07/04/2013   CLINICAL DATA:  Lung cancer with thoracic spine metastases.  EXAM: MRI THORACIC SPINE WITHOUT AND WITH CONTRAST  TECHNIQUE: Multiplanar and multiecho pulse sequences of the thoracic spine were obtained without and with intravenous contrast.  CONTRAST:  13 mL MultiHance.  COMPARISON:  None.  FINDINGS: The thoracic spinal alignment is anatomic. The thoracic cord appears normal. There are no intra medullary lesions. No evidence of dural metastatic disease. The bone marrow signal is normal with the exception of the left side of T3 in the vertebral body, pedicle and pars interarticularis. This shows decreased T1 weighted marrow signal the on precontrast imaging and the enhancement on post gadolinium imaging (image 8 series 3). The extension of the posterior elements makes this suspicious for early metastatic disease. There is no extraosseous extension and no other foci of metastatic disease are identified. Paraspinal soft tissues demonstrate scarring in the lungs.  There is no thoracic spine degenerative disease. The alignment is within normal limits.  IMPRESSION: Ill-defined effacement of normal fatty marrow and post gadolinium enhancement centered around the pedicle of T3 on the left is suspicious for early metastatic disease. No other foci of thoracic spine metastatic disease are identified.   Electronically Signed   By: Dereck Ligas M.D.   On: 07/04/2013 10:52   Mr Lumbar Spine W Wo Contrast  07/04/2013   CLINICAL DATA:  Lung cancer. leg weakness. Numbness. Falls.  EXAM: MRI LUMBAR SPINE WITHOUT AND WITH CONTRAST  TECHNIQUE: Multiplanar and multiecho pulse sequences of the lumbar spine were  obtained without and with intravenous contrast.  CONTRAST:  13 mL MultiHance.  COMPARISON:  12/18/2012.  FINDINGS: Straightening of the normal lumbar lordosis. Five lumbar type vertebral bodies are present. Vertebral body height is preserved. There is no evidence of metastatic disease. No effacement of normal fatty marrow or abnormal post gadolinium enhancement. Spinal cord terminates posterior to the L1-L2 interspace. Intervertebral discs from T12-L1 through L4-L5 are normal.  L5-S1 shows disc desiccation with a right paracentral disc extrusion that shows a small amount of caudal migration of disc material. The central canal remains patent. The extrusion contacts both descending S1 nerves as they approach the lateral recesses but there is no neural compression. L5-S1 neural foramina appears normal.  IMPRESSION: Single level L5-S1 disc degeneration with a right paracentral extrusion. No metastatic disease in the lumbar spine.   Electronically Signed   By: Dereck Ligas M.D.   On: 07/04/2013 11:25   Mr Lumbar Spine W Wo Contrast  07/04/2013   CLINICAL DATA:  Lung cancer.  leg weakness.  Numbness.  Falls.  EXAM: MRI LUMBAR SPINE WITHOUT AND WITH CONTRAST  TECHNIQUE: Multiplanar and multiecho pulse sequences of the lumbar spine were obtained without and with intravenous contrast.  CONTRAST:  13 mL MultiHance.  COMPARISON:  12/18/2012.  FINDINGS: Straightening of the normal lumbar lordosis. Five lumbar type vertebral bodies are present. Vertebral body height is preserved. There is no evidence of metastatic disease. No effacement of normal fatty marrow or abnormal post gadolinium enhancement. Spinal cord terminates posterior to the L1-L2 interspace. Intervertebral discs from T12-L1 through L4-L5 are normal.  L5-S1 shows disc desiccation with a right paracentral disc extrusion that shows a small amount of caudal migration of disc material. The central canal remains patent. The extrusion contacts both descending S1  nerves as they approach the lateral recesses but there is no neural compression. L5-S1 neural foramina appears normal.  IMPRESSION: Single level L5-S1 disc degeneration with a right paracentral extrusion. No metastatic disease in the lumbar spine.   Electronically Signed   By: Dereck Ligas M.D.   On: 07/04/2013 11:12   Ct Abdomen Pelvis W Contrast  07/15/2013   CLINICAL DATA:  Non-small-cell right lung cancer with brain metastases, XRT and chemotherapy ongoing  EXAM: CT CHEST, ABDOMEN, AND PELVIS WITH CONTRAST  TECHNIQUE: Multidetector CT imaging of the chest, abdomen and pelvis was performed following the standard protocol during bolus administration of intravenous contrast.  CONTRAST:  113mL OMNIPAQUE IOHEXOL 300 MG/ML  SOLN  COMPARISON:  04/09/2013  FINDINGS: CT CHEST FINDINGS  Prior spiculated posterior right upper lobe nodule has resolved.  Additional bilateral upper lobe nodularity has essentially resolved, either infectious/inflammatory or reflecting treated metastasis.  Residual 1-2 mm subpleural nodules in the posterior right upper lobe (series 5/ images 16 and 26) and lateral right upper lobe (series 5/image 28). This appearance/distribution is not suspicious for metastatic disease.  Radiation changes in the right upper lobe. Moderate centrilobular and paraseptal emphysematous changes at the lung apices. Mild scarring at the left lung base. No pleural effusion or pneumothorax.  Visualized thyroid is unremarkable.  The heart is normal in size. No pericardial effusion. Focal narrowing of the SVC (series 2/ image 21), which remains patent.  Right paratracheal soft tissue measures 8 mm short axis (series 2/image 19), previously 10 mm. No suspicious hilar or axillary lymphadenopathy.  Visualized osseous structures are within normal limits.  CT ABDOMEN AND PELVIS FINDINGS  Liver, spleen, pancreas, and left adrenal gland are within normal limits.  2.9 x 1.5 cm right adrenal mass (series 2/image 61), new,  suspicious for metastasis.  Gallbladder is unremarkable. No intrahepatic or extrahepatic ductal dilatation.  Kidneys are within normal limits.  No hydronephrosis.  No evidence of bowel obstruction.  No evidence of abdominal aortic aneurysm.  No abdominopelvic ascites.  No suspicious abdominopelvic lymphadenopathy.  Prostate is unremarkable.  Bladder is within normal limits.  Visualized osseous structures are within normal limits.  IMPRESSION: Prior spiculated right upper lobe nodule has resolved.  Additional bilateral upper lobe nodularity has essentially resolved, with residual tiny 1-2 mm subpleural nodules which are favored to be benign.  8 mm short axis right paratracheal node, mildly improved.  2.9 x 1.5 cm right adrenal  mass, new, suspicious for metastasis.   Electronically Signed   By: Julian Hy M.D.   On: 07/15/2013 12:57   Impression:  The patient has an enlarging left frontal brain mass with enhancement and edema at the site of previous stereotactic radiosurgery. 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  Plan:  I reviewed the current plan with the patient today. We'll work towards pursuing brain MRI in approximately 3 weeks.  _____________________________________  Sheral Apley Tammi Klippel, M.D.

## 2013-07-21 ENCOUNTER — Telehealth: Payer: Self-pay | Admitting: Radiation Oncology

## 2013-07-21 ENCOUNTER — Ambulatory Visit
Admission: RE | Admit: 2013-07-21 | Discharge: 2013-07-21 | Disposition: A | Discharge status: Home / Self Care | Payer: 59 | Source: Ambulatory Visit | Attending: Radiation Oncology | Admitting: Radiation Oncology

## 2013-07-21 ENCOUNTER — Encounter: Payer: Self-pay | Admitting: Radiation Oncology

## 2013-07-21 VITALS — BP 139/83 | HR 96 | Temp 98.0°F | Resp 16 | Ht 71.0 in | Wt 136.5 lb

## 2013-07-21 DIAGNOSIS — Z510 Encounter for antineoplastic radiation therapy: Secondary | ICD-10-CM | POA: Insufficient documentation

## 2013-07-21 DIAGNOSIS — IMO0001 Reserved for inherently not codable concepts without codable children: Secondary | ICD-10-CM

## 2013-07-21 DIAGNOSIS — C7931 Secondary malignant neoplasm of brain: Secondary | ICD-10-CM | POA: Insufficient documentation

## 2013-07-21 DIAGNOSIS — C3490 Malignant neoplasm of unspecified part of unspecified bronchus or lung: Secondary | ICD-10-CM | POA: Insufficient documentation

## 2013-07-21 DIAGNOSIS — C7949 Secondary malignant neoplasm of other parts of nervous system: Secondary | ICD-10-CM

## 2013-07-21 NOTE — Progress Notes (Signed)
Patient reports occasional left neck pain at the site of palpable cervical nodes. Reports occasionally it is difficult to swallow. Reports difficulty controlling left leg at times. Reports persistent numbness, tingling and weakness of left leg. Reports he had "stroke symptoms on his left side before he started the decadron." Reports taking decadron bid but, is unsure of the dose. Also, reports taking medication to prevent seizures but, is unsure of the name. Encouraged patient to bring pill bottles next visit so that medication list can be update. Reports he has to go to work tonight but, is working on obtaining disability. Reports occasional dizzy spells. Denies pain, headache, dizziness, nausea or vomiting. Reports one episode of urgency and incontinence after he drank a large sweet tea from [employer]. Vitals WDL.

## 2013-07-21 NOTE — Progress Notes (Signed)
See progress note under physician encounter. 

## 2013-07-21 NOTE — Telephone Encounter (Signed)
Phoned patient to wake him for appointment. No answer. Left message encouraging patient to keep appointment.

## 2013-07-23 ENCOUNTER — Other Ambulatory Visit: Payer: 59

## 2013-07-23 NOTE — Addendum Note (Signed)
Encounter addended by: Deirdre Evener, RN on: 07/23/2013  6:58 PM<BR>     Documentation filed: Charges VN

## 2013-07-30 ENCOUNTER — Other Ambulatory Visit: Payer: 59

## 2013-08-06 ENCOUNTER — Other Ambulatory Visit: Payer: 59

## 2013-08-06 ENCOUNTER — Ambulatory Visit: Payer: 59

## 2013-08-08 ENCOUNTER — Other Ambulatory Visit: Payer: 59

## 2013-08-11 ENCOUNTER — Ambulatory Visit: Payer: 59 | Admitting: Radiation Oncology

## 2013-08-11 ENCOUNTER — Telehealth: Payer: Self-pay | Admitting: Radiation Oncology

## 2013-08-11 ENCOUNTER — Encounter: Payer: Self-pay | Admitting: Radiation Oncology

## 2013-08-11 ENCOUNTER — Ambulatory Visit
Admission: RE | Admit: 2013-08-11 | Discharge: 2013-08-11 | Disposition: A | Discharge status: Home / Self Care | Payer: 59 | Source: Ambulatory Visit | Attending: Radiation Oncology | Admitting: Radiation Oncology

## 2013-08-11 ENCOUNTER — Ambulatory Visit: Payer: 59

## 2013-08-11 VITALS — BP 142/84 | HR 91 | Resp 16

## 2013-08-11 DIAGNOSIS — C7931 Secondary malignant neoplasm of brain: Secondary | ICD-10-CM

## 2013-08-11 DIAGNOSIS — C3490 Malignant neoplasm of unspecified part of unspecified bronchus or lung: Secondary | ICD-10-CM

## 2013-08-11 DIAGNOSIS — C7949 Secondary malignant neoplasm of other parts of nervous system: Principal | ICD-10-CM

## 2013-08-11 MED ORDER — OXYCODONE HCL 5 MG PO TABS: 5.0000 mg | ORAL_TABLET | ORAL | Status: DC | PRN

## 2013-08-11 NOTE — Progress Notes (Addendum)
Patient phoned today requesting refill of norco 5/325 for left side pain 10 on a scale of 0-10. In addition, patient reports low back, toes on left foot, and fingers on left hand are numbness. Reports occasionally while walking he losses control of his entire left side. Reports urinary frequency and difficulty emptying his bladder. Denies having a bowel movement in the last three day. Reports he forces himself to eat but, has no appetite. Reports he constantly has a metallic taste in his mouth. Reports throat pain and difficulty swallowing. Edema of face and neck noted.

## 2013-08-11 NOTE — Telephone Encounter (Signed)
Received word from Marcine Matar the patient phoned requesting a refill of his Percocet. Noted patient hasn't had Percocet filled since March when Dr. Julien Nordmann did. Phoned patient to question location, duration and intensity of pain. No answer. Left message requesting return call.

## 2013-08-11 NOTE — Telephone Encounter (Signed)
Phoned patient' pharmacy to verify medications and will update med list accordingly.

## 2013-08-15 ENCOUNTER — Ambulatory Visit
Admission: RE | Admit: 2013-08-15 | Discharge: 2013-08-15 | Disposition: A | Discharge status: Home / Self Care | Payer: 59 | Source: Ambulatory Visit | Attending: Radiation Oncology | Admitting: Radiation Oncology

## 2013-08-15 DIAGNOSIS — C7931 Secondary malignant neoplasm of brain: Secondary | ICD-10-CM

## 2013-08-15 DIAGNOSIS — C7949 Secondary malignant neoplasm of other parts of nervous system: Principal | ICD-10-CM

## 2013-08-15 MED ORDER — GADOBENATE DIMEGLUMINE 529 MG/ML IV SOLN
13.0000 mL | Freq: Once | INTRAVENOUS | Status: AC | PRN
  Administered 2013-08-15: 13 mL via INTRAVENOUS

## 2013-08-15 NOTE — Progress Notes (Signed)
Quick Note:  Terrance Clark, please set-up SRS ______

## 2013-08-17 ENCOUNTER — Encounter: Payer: Self-pay | Admitting: Radiation Oncology

## 2013-08-17 NOTE — Progress Notes (Signed)
Radiation Oncology         [phone]) [phone] ________________________________  Name: Terrance Clark MRN: [id number]  Date: 08/18/2013  DOB: 1975-04-12  Multidisciplinary Neuro Oncology Clinic Follow-Up Visit Note  CC: No PCP Per Patient  No ref. provider found  Diagnosis:   38 year old gentleman with stage T1 N3 M1 non-small cell lung cancer with brain metastases s/p multiple courses of radiation:   1. 07/16/12-09/10/2012, Lower neck and upper chest region, 63 gray in 35 fractions   2. 07/18/2012 stereotactic radiosurgery for three brain metastasis   Right parietal 21 mm   Right posterior parietal 6 mm   Left thalamic 5 mm   3. 01/31/2013 and 02/05/2013 stereotactic radiosurgery to 7 new lesions to 20 Gy   Right frontal 12 mm   Right parietal 5 mm   Left parietal 4 mm   Left insular 3 mm   Right occipital 3 mm   Left Thalamus 3 mm   Left Occipital 1 mm   4. 04/30/2013 5 new brain metastases were treated with SRS to 20 Gy:   Cerebellar vermis 4 mm   The first Left temporal 3 mm   The second Left temporal 3 mm   Left Occipital 2 mm   Left medial parietal 1 mm    Interval Since Last Radiation:  3.5  months  Narrative:  The patient returns today for routine follow-up with myself and Dr. Vertell Limber from neurosurgery.  The recent films were presented in our multidisciplinary conference with neuroradiology just prior to the clinic.  Continues to take decadron 4 mg bid. Reports throat continues to feel swollen and he often chocks on his saliva. Reports he occasionally wakes up and has no feeling in his left leg. Reports he often feels he can't control his left leg. Reports he continues to have a metallic taste in his mouth. Reports he had a horrible frontal headache yesterday that radiated down into his sinuses causing him to take two oxy ir to sleep for relief. Denies diplopia or ringing in the ears                               ALLERGIES:  has No Known Allergies.  Meds: Current Outpatient  Prescriptions  Medication Sig Dispense Refill  . dexamethasone (DECADRON) 4 MG tablet Take 4 mg by mouth 2 (two) times daily.      Marland Kitchen levETIRAcetam (KEPPRA) 500 MG tablet Take 500 mg by mouth 2 (two) times daily.      Marland Kitchen oxyCODONE (OXY IR/ROXICODONE) 5 MG immediate release tablet Take 1-3 tablets (5-15 mg total) by mouth every 4 (four) hours as needed for severe pain.  120 tablet  0   No current facility-administered medications for this encounter.    Physical Findings: The patient is in no acute distress. Patient is alert and oriented.  weight is 139 lb 1.6 oz (63.095 kg). His oral temperature is 98.5 F (36.9 C). His blood pressure is 132/83 and his pulse is 86. His respiration is 12. .  No significant changes.  Lab Findings: Lab Results  Component Value Date   WBC 7.8 07/16/2013   HGB 13.4 07/16/2013   HCT 40.8 07/16/2013   MCV 88.1 07/16/2013   PLT 260 07/16/2013    @LASTCHEM @  Radiographic Findings: Mr Terrance Clark NF Contrast  08/15/2013   CLINICAL DATA:  S RS restaging, 14 month study.  EXAM: MRI HEAD WITHOUT AND WITH CONTRAST  TECHNIQUE:  Multiplanar, multiecho pulse sequences of the brain and surrounding structures were obtained without and with intravenous contrast.  CONTRAST:  81mL MULTIHANCE GADOBENATE DIMEGLUMINE 529 MG/ML IV SOLN  COMPARISON:  07/04/2013 and multiple previous  FINDINGS: No sign of acute infarction, hydrocephalus or extra-axial collection.  There are 3 general categories of lesions in this patient. There are several small lesions that are new and or enlarging. In the right cerebellum on coronal image 21 and axial image 48, there is a newly seen 2.5 mm metastasis. On that same image to the left of midline in the vermis there is a newly seen 1.5 mm lesion. A right occipital lesion previously seen is enlarging, measuring 4.5 mm today as opposed to 2.7 mm on the previous study. There is a newly seen 2.5 mm lesion in the white matter adjacent to the atrium of the right lateral  ventricle on axial image 93.  The second category relates to scattered punctate enhancing foci at that are stable. These are scattered throughout the brain. It should also be noted that there have been multiple lesions demonstrated over time which are no longer visible.  The third category relates to a centrally necrotic enhancing region at the right parietal vertex. There is much less edema surrounding this area today than on the previous study. However, the outer dimensions are slightly increased, measured in the same location as 21 x 23 mm as opposed to 17 x 23 mm previously. Central necrotic portion is slightly more extensive. 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  IMPRESSION: 4 new/enlarging lesions as discussed above. The largest is a 4.5 mm metastasis in the right occipital lobe that has increased in size from 2.7 mm on the previous study.  Less edema at the right parietal vertex. Slight enlargement of the region of enhancement, which is also showing more internal necrosis. 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  Multiple punctate lesions stable since the previous study. Many lesions previously seen are no longer visible when compared to multiple previous studies.   Electronically Signed   By: Terrance Clark M.D.   On: 08/15/2013 10:26    Impression:  The patient has new brain mets amenable to [facility] and improvement in mass effect from his right parietal recurrence versus adverse radiation effect with steroids.  Today, the patient and his friend expressed frustration about how he keeps developing recurrences and questioning if the treatment may be doing him more harm than good.  We spent time educating him about the nature of these metastases and reassuring him that we actively succeeding in controlling his disease, and acknowledging that his frustration is normal.  Plan:  Decrease steroid to 2 mg BID dex and salvage  SRS.  _____________________________________  Sheral Apley Tammi Klippel, M.D. and  Erline Levine, M.D.

## 2013-08-18 ENCOUNTER — Ambulatory Visit
Admission: RE | Admit: 2013-08-18 | Discharge: 2013-08-18 | Disposition: A | Discharge status: Home / Self Care | Payer: 59 | Source: Ambulatory Visit | Attending: Radiation Oncology | Admitting: Radiation Oncology

## 2013-08-18 ENCOUNTER — Encounter: Payer: Self-pay | Admitting: Radiation Oncology

## 2013-08-18 VITALS — BP 132/83 | HR 86 | Temp 98.5°F | Resp 12 | Wt 139.1 lb

## 2013-08-18 DIAGNOSIS — C7931 Secondary malignant neoplasm of brain: Secondary | ICD-10-CM

## 2013-08-18 DIAGNOSIS — C7949 Secondary malignant neoplasm of other parts of nervous system: Principal | ICD-10-CM

## 2013-08-18 MED ORDER — DEXAMETHASONE 4 MG PO TABS
2.0000 mg | ORAL_TABLET | Freq: Two times a day (BID) | ORAL | Status: DC
Start: 2013-08-18 — End: 2013-12-18

## 2013-08-18 NOTE — Progress Notes (Signed)
Continues to take decadron 4 mg bid. Reports throat continues to feel swollen and he often chocks on his saliva. Reports he occasionally wakes up and has no feeling in his left leg. Reports he often feels he can't control his left leg. Reports he continues to have a metallic taste in his mouth. Reports he had a horrible frontal headache yesterday that radiated down into his sinuses causing him to take two oxy ir to sleep for relief. Denies diplopia or ringing in the ears.

## 2013-08-20 ENCOUNTER — Ambulatory Visit: Payer: 59 | Admitting: Radiation Oncology

## 2013-08-20 ENCOUNTER — Ambulatory Visit: Payer: 59

## 2013-08-27 ENCOUNTER — Encounter: Payer: Self-pay | Admitting: Radiation Oncology

## 2013-08-27 ENCOUNTER — Telehealth: Payer: Self-pay | Admitting: Radiation Oncology

## 2013-08-27 ENCOUNTER — Other Ambulatory Visit: Payer: Self-pay | Admitting: Radiation Oncology

## 2013-08-27 DIAGNOSIS — C7931 Secondary malignant neoplasm of brain: Secondary | ICD-10-CM

## 2013-08-27 NOTE — Telephone Encounter (Signed)
Patient works at night and sleeps during the day. Left message on patient's voicemail requesting he arrive as close to 0800 tomorrow as possible for stat CMP with IV to follow for simulation. Provided patient with contact information should he have questions.

## 2013-08-28 ENCOUNTER — Ambulatory Visit: Admission: RE | Admit: 2013-08-28 | Payer: 59 | Source: Ambulatory Visit

## 2013-08-28 ENCOUNTER — Ambulatory Visit
Admission: RE | Admit: 2013-08-28 | Payer: 59 | Source: Ambulatory Visit | Attending: Radiation Oncology | Admitting: Radiation Oncology

## 2013-08-28 ENCOUNTER — Ambulatory Visit: Admission: RE | Admit: 2013-08-28 | Payer: 59 | Source: Ambulatory Visit | Admitting: Radiation Oncology

## 2013-08-28 ENCOUNTER — Ambulatory Visit: Payer: 59 | Admitting: Radiation Oncology

## 2013-08-29 ENCOUNTER — Telehealth: Payer: Self-pay | Admitting: Radiation Oncology

## 2013-08-29 ENCOUNTER — Ambulatory Visit
Admission: RE | Admit: 2013-08-29 | Discharge: 2013-08-29 | Disposition: A | Discharge status: Home / Self Care | Payer: 59 | Source: Ambulatory Visit | Attending: Radiation Oncology | Admitting: Radiation Oncology

## 2013-08-29 DIAGNOSIS — C3490 Malignant neoplasm of unspecified part of unspecified bronchus or lung: Secondary | ICD-10-CM

## 2013-08-29 DIAGNOSIS — C3410 Malignant neoplasm of upper lobe, unspecified bronchus or lung: Secondary | ICD-10-CM

## 2013-08-29 LAB — CBC WITH DIFFERENTIAL/PLATELET
BASO%: 0.5 % (ref 0.0–2.0)
Basophils Absolute: 0 10*3/uL (ref 0.0–0.1)
EOS%: 0.1 % (ref 0.0–7.0)
Eosinophils Absolute: 0 10*3/uL (ref 0.0–0.5)
HCT: 41.4 % (ref 38.4–49.9)
HEMOGLOBIN: 13.6 g/dL (ref 13.0–17.1)
LYMPH%: 11.2 % — ABNORMAL LOW (ref 14.0–49.0)
MCH: 29.6 pg (ref 27.2–33.4)
MCHC: 33 g/dL (ref 32.0–36.0)
MCV: 89.8 fL (ref 79.3–98.0)
MONO#: 0.4 10*3/uL (ref 0.1–0.9)
MONO%: 5.6 % (ref 0.0–14.0)
NEUT%: 82.6 % — ABNORMAL HIGH (ref 39.0–75.0)
NEUTROS ABS: 6.1 10*3/uL (ref 1.5–6.5)
Platelets: 116 10*3/uL — ABNORMAL LOW (ref 140–400)
RBC: 4.61 10*6/uL (ref 4.20–5.82)
RDW: 15.9 % — AB (ref 11.0–14.6)
WBC: 7.4 10*3/uL (ref 4.0–10.3)
lymph#: 0.8 10*3/uL — ABNORMAL LOW (ref 0.9–3.3)

## 2013-08-29 LAB — COMPREHENSIVE METABOLIC PANEL (CC13)
ALK PHOS: 89 U/L (ref 40–150)
ALT: 38 U/L (ref 0–55)
AST: 16 U/L (ref 5–34)
Albumin: 3.9 g/dL (ref 3.5–5.0)
Anion Gap: 11 mEq/L (ref 3–11)
BUN: 22.1 mg/dL (ref 7.0–26.0)
CO2: 22 mEq/L (ref 22–29)
Calcium: 9.3 mg/dL (ref 8.4–10.4)
Chloride: 106 mEq/L (ref 98–109)
Creatinine: 0.9 mg/dL (ref 0.7–1.3)
Glucose: 96 mg/dl (ref 70–140)
Potassium: 3.4 mEq/L — ABNORMAL LOW (ref 3.5–5.1)
SODIUM: 139 meq/L (ref 136–145)
TOTAL PROTEIN: 6.9 g/dL (ref 6.4–8.3)
Total Bilirubin: 0.21 mg/dL (ref 0.20–1.20)

## 2013-08-29 NOTE — Telephone Encounter (Signed)
Phoned patient to inquire if he would be coming in for labs this morning. No answer. Left message requesting return call.

## 2013-09-01 ENCOUNTER — Ambulatory Visit
Admission: RE | Admit: 2013-09-01 | Discharge: 2013-09-01 | Disposition: A | Discharge status: Home / Self Care | Payer: 59 | Source: Ambulatory Visit | Attending: Radiation Oncology | Admitting: Radiation Oncology

## 2013-09-01 ENCOUNTER — Ambulatory Visit: Admission: RE | Admit: 2013-09-01 | Payer: 59 | Source: Ambulatory Visit | Admitting: Radiation Oncology

## 2013-09-01 VITALS — BP 143/84 | HR 93 | Wt 139.1 lb

## 2013-09-01 DIAGNOSIS — C7931 Secondary malignant neoplasm of brain: Secondary | ICD-10-CM

## 2013-09-01 DIAGNOSIS — C7949 Secondary malignant neoplasm of other parts of nervous system: Principal | ICD-10-CM

## 2013-09-01 MED ORDER — SODIUM CHLORIDE 0.9 % IJ SOLN
10.0000 mL | Freq: Once | INTRAMUSCULAR | Status: AC
  Administered 2013-09-01: 10 mL via INTRAVENOUS

## 2013-09-01 NOTE — Progress Notes (Signed)
  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  Identity was confirmed.  All relevant records and images related to the planned course of therapy were reviewed.  The patient freely provided informed written consent to proceed with treatment after reviewing the details related to the planned course of therapy. The consent form was witnessed and verified by the simulation staff. Intravenous access was established for contrast administration. Then, the patient was set-up in a stable reproducible supine position for radiation therapy.  A relocatable thermoplastic stereotactic head frame was fabricated for precise immobilization.  CT images were obtained.  Surface markings were placed.  The CT images were loaded into the planning software and fused with the patient's targeting MRI scan.  Then the target and avoidance structures were contoured.  Treatment planning then occurred.  The radiation prescription was entered and confirmed.  I have requested 3D planning  I have requested a DVH of the following structures: Brain stem, brain, left eye, right eye, lenses, optic chiasm, target volumes, uninvolved brain, and normal tissue.    PLAN:  The patient will receive 20 Gy in one fraction.  ________________________________  Jodelle Gross, MD, PhD  On behalf of     Sheral Apley Tammi Klippel, M.D.

## 2013-09-01 NOTE — Progress Notes (Signed)
Removed #22 gauge IV from Patient's right forearm.  IV removed intact.  Pressure and bandaid applied.  Patient tolerated well.

## 2013-09-01 NOTE — Progress Notes (Signed)
Patient presented to the clinic today unaccompanied for IV start necessary for simulation. 08/29/2013 BUN and creatinine WDL. Vitals WDL. Patient denies pain. Patient verbalizes, "the only medication I am taking is decadron once a day." Started right forearm 22 gauge IV on the first attempt. Patient tolerated well. Excellent blood return obtained. Secured IV in place. Escorted patient to CT for simulation. Steady gait noted. Patient reports, "I am so tired is there anything you can give me to improve my energy level." Respectfully explain to the patient that decadron is the only medication used that cause increase his energy level however, fatigue is expect for someone working two jobs with few days off and multiple brain mets. Explained that a MVI could help. Patient verbalized understanding.

## 2013-09-03 ENCOUNTER — Other Ambulatory Visit: Payer: Self-pay | Admitting: Pediatrics

## 2013-09-04 ENCOUNTER — Encounter: Payer: Self-pay | Admitting: Radiation Oncology

## 2013-09-04 ENCOUNTER — Ambulatory Visit
Admission: RE | Admit: 2013-09-04 | Discharge: 2013-09-04 | Disposition: A | Discharge status: Home / Self Care | Payer: 59 | Source: Ambulatory Visit

## 2013-09-04 ENCOUNTER — Ambulatory Visit
Admission: RE | Admit: 2013-09-04 | Discharge: 2013-09-04 | Disposition: A | Discharge status: Home / Self Care | Payer: 59 | Source: Ambulatory Visit | Attending: Radiation Oncology | Admitting: Radiation Oncology

## 2013-09-04 VITALS — BP 148/84 | HR 67 | Temp 97.7°F | Resp 16

## 2013-09-04 DIAGNOSIS — Z66 Do not resuscitate: Secondary | ICD-10-CM

## 2013-09-04 DIAGNOSIS — Z515 Encounter for palliative care: Secondary | ICD-10-CM

## 2013-09-04 DIAGNOSIS — Z7189 Other specified counseling: Secondary | ICD-10-CM

## 2013-09-04 DIAGNOSIS — C7931 Secondary malignant neoplasm of brain: Secondary | ICD-10-CM

## 2013-09-04 DIAGNOSIS — C7949 Secondary malignant neoplasm of other parts of nervous system: Principal | ICD-10-CM

## 2013-09-04 DIAGNOSIS — R5383 Other fatigue: Secondary | ICD-10-CM

## 2013-09-04 DIAGNOSIS — R5381 Other malaise: Secondary | ICD-10-CM

## 2013-09-04 NOTE — Op Note (Signed)
  Name: Terrance Clark  MRN: [id number]  Date: 09/04/2013   DOB: 1975-04-10  Stereotactic Radiosurgery Operative Note  PRE-OPERATIVE DIAGNOSIS:  Multiple Brain Metastases  POST-OPERATIVE DIAGNOSIS:  Multiple Brain Metastases  PROCEDURE:  Stereotactic Radiosurgery  SURGEON:  Peggyann Shoals, MD  NARRATIVE: The patient underwent a radiation treatment planning session in the radiation oncology simulation suite under the care of the radiation oncology physician and physicist.  I participated closely in the radiation treatment planning afterwards. The patient underwent planning CT which was fused to 3T high resolution MRI with 1 mm axial slices.  These images were fused on the planning system.  We contoured the gross target volumes and subsequently expanded this to yield the Planning Target Volume. I actively participated in the planning process.  I helped to define and review the target contours and also the contours of the optic pathway, eyes, brainstem and selected nearby organs at risk.  All the dose constraints for critical structures were reviewed and compared to AAPM Task Group 101.  The prescription dose conformity was reviewed.  I approved the plan electronically.    Accordingly, Terrance Clark was brought to the TrueBeam stereotactic radiation treatment linac and placed in the custom immobilization mask.  The patient was aligned according to the IR fiducial markers with BrainLab Exactrac, then orthogonal x-rays were used in ExacTrac with the 6DOF robotic table and the shifts were made to align the patient  Terrance Clark received stereotactic radiosurgery uneventfully.    Lesions treated:  3   Complex lesions treated:  0 (>3.5 cm, <39mm of optic path, or within the brainstem)   The detailed description of the procedure is recorded in the radiation oncology procedure note.  I was present for the duration of the procedure.  DISPOSITION:  Following delivery, the patient was transported to nursing in  stable condition and monitored for possible acute effects to be discharged to home in stable condition with follow-up in one month.  Peggyann Shoals, MD 09/04/2013 5:06 PM

## 2013-09-04 NOTE — Progress Notes (Signed)
  Radiation Oncology         [phone]) [phone] ________________________________  Stereotactic Treatment Procedure Note  Name: Terrance Clark MRN: [id number]  Date: 09/04/2013  DOB: 05-07-1975  SPECIAL TREATMENT PROCEDURE  3D TREATMENT PLANNING AND DOSIMETRY:  The patient's radiation plan was reviewed and approved by neurosurgery and radiation oncology prior to treatment.  It showed 3-dimensional radiation distributions overlaid onto the planning CT/MRI image set.  The [facility] for the target structures as well as the organs at risk were reviewed. The documentation of the 3D plan and dosimetry are filed in the radiation oncology EMR.  NARRATIVE:  Terrance Clark was brought to the TrueBeam stereotactic radiation treatment machine and placed supine on the CT couch. The head frame was applied, and the patient was set up for stereotactic radiosurgery.  Neurosurgery was present for the set-up and delivery  SIMULATION VERIFICATION:  In the couch zero-angle position, the patient underwent Exactrac imaging using the Brainlab system with orthogonal KV images.  These were carefully aligned and repeated to confirm treatment position for each of the isocenters.  The Exactrac snap film verification was repeated at each couch angle.  SPECIAL TREATMENT PROCEDURE: Terrance Clark received stereotactic radiosurgery to the following targets: Right occiptal 4.5 mm target was treated using 3 Circular Arcs to a prescription dose of 20 Gy.  ExacTrac registration was performed for each couch angle.  The 78.7% isodose line was prescribed.  The 7.5 mm collimator was used. Right cerebellar 2.5 mm target was treated using 3 Circular Arcs to a prescription dose of 20 Gy.  ExacTrac registration was performed for each couch angle.  The 77.5% isodose line was prescribed.  The 6 mm collimator was used. Right lat ventricle 2.5 mm target was treated using 3 Circular Arcs to a prescription dose of 20 Gy.  ExacTrac registration was performed for  each couch angle.  The 78.7% isodose line was prescribed.  The 6 mm collimator was used.  STEREOTACTIC TREATMENT MANAGEMENT:  Following delivery, the patient was transported to nursing in stable condition and monitored for possible acute effects.  Vital signs were recorded BP 148/84  Pulse 67  Temp(Src) 97.7 F (36.5 C) (Oral)  Resp 16  SpO2 98%. The patient tolerated treatment without significant acute effects, and was discharged to home in stable condition.    PLAN: Follow-up in one month.  ________________________________  Sheral Apley. Tammi Klippel, M.D.

## 2013-09-04 NOTE — Progress Notes (Signed)
Terrance Clark here for post [facility] monitoring.  He is alert and oriented to person, place and time.  He denies pain, headache, vision changes and nausea.  He reports taking 2 mg of decadron once a day.  Patient is meeting with Ander Purpura, CSW and Stanton Kidney, Palliative Care NP.

## 2013-09-05 NOTE — Consult Note (Signed)
Patient Terrance Clark      DOB: [birth year]      YWV:[id number]     Consult Note from the Palliative Medicine Team at [place] [facility] Requested by: Dr Tammi Klippel     PCP: No PCP Per Patient Reason for Consultation; Clarification of [place] and options             Phone                                                                                                                                Number:None  Assessment of patients Current state:     Metastatic non-small cell lung cancer, adenocarcinoma with unknown status of the EGFR mutation or ALK gene translocation secondary to insufficient material, diagnosed in April 2014.   S/P Chemotherapy (Dr Earlie Server) and  SRS  (Dr Tammi Klippel)  Patient struggles with continues to attempt to live his full life  and balance the  emotional and physical components of living with a terminal disease.   Consult is for introduction to the concept of Palliative Medicine;clarification of Advanced Directives,  holistic support and symptom management as indicated  This NP Wadie Lessen reviewed medical records, received report from team, assessed the patient and then meet with  patient in the outpatient oncology clinic s/p SRS procedure.  Laurin Mullis LCSW present for meeting  A detailed discussion was had today regarding advanced directives.  Concepts specific to code status, artifical feeding and hydration and designating an HPOA.   Values and goals of care important to patient and family were attempted to be elicited.  Concept of Palliative Care was discussed  He is encouraged to call with questions or concerns.     Goals of Care: 1.  Code Status:  DNR/DNI   2. Scope of Treatment:  At this time patietn is open to all available and offered medical interventions to prolong quality life.  He verbalizes an understanding of his long term limited prognosis.  For now he plans to works as long as he can and continue living the life he knew (prior to  diagnosis)    4. Symptom Management:   Fatigue-Pace yourself -Plan your day -Include naps and breaks -schedule a relaxing day -get a little exercise -fuel the body -consider complementary therapies -deep breathing -prayer/medication  Although these suggestions were discussed patient reports that he continues to work two jobs.  He understands the long term prognosis and side effects of his treatments and the importance of "balance" but "this is how I'm doing it"   5. Psychosocial:  Emotional support offered to patient    Patient Documents Completed or Given: Document Given Completed  Advanced Directives Pkt yes   MOST yes   DNR    Gone from My Sight    Hard Choices      Brief HPI: DIAGNOSIS: Metastatic non-small cell lung cancer, adenocarcinoma with unknown status of the EGFR mutation or ALK  gene translocation secondary to insufficient material, diagnosed in April 2014.  PRIOR THERAPY:  1) stereotactic radiotherapy under the care of Dr. Tammi Klippel completed on 07/18/2012.  2) palliative radiotherapy to the left upper lobe and mediastinal lymph nodes. Completed 09/10/2012.  3) Systemic chemotherapy with carboplatin for AUC of 5 and Alimta 500 mg/M2 giving every 3 weeks. First dose was given on 07/15/2012. Status post 6 cycles, with partial response.  4) stereotactic radiotherapy to the 7 new brain lesions under the care of Dr. Tammi Klippel on 01/31/2013 and 02/05/2013.   CURRENT THERAPY:  1) systemic chemotherapy with single agent Alimta 500 mg/M2 every 3 weeks, status post 3 cycle. First dose on 04/23/2013.   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 H/O oral surgery 04/2012  . Cough   . Shortness of breath     with exertion  . Insomnia     d/t pain  and takes [employer] PM  . Hx of radiation therapy 07/16/12- 09/10/12    lower neck and upper chest region , 63 gray in 35 fx  . Hx of radiation therapy 07/18/12    SRS to brain metastasis  . Lung cancer 06/2012    RUL lung  . 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 Cancer     adrenal mets     PSH: Past Surgical History  Procedure Laterality Date  . Foot surgery    . Mouth surgery    . Video bronchoscopy with endobronchial ultrasound Right 06/19/2012    Procedure: VIDEO BRONCHOSCOPY WITH ENDOBRONCHIAL ULTRASOUND;  Surgeon: Collene Gobble, MD;  Location: [facility];  Service: Pulmonary;  Laterality: Right;   I have reviewed the [place] and SH and  If appropriate update it with new information. No Known Allergies Scheduled Meds: Continuous Infusions: PRN Meds:.      There were no vitals taken for this visit.   ECOG  - (0)  No intake or output data in the 24 hours ending 09/05/13 1132   Physical Exam:  General: fatigued appearing, NAD HEENT:  Moist buccal mmebranes Ext: without edema Skin: warm and dry Neuro: alert and oriented X3  Labs: CBC    Component Value Date/Time   WBC 7.4 08/29/2013 1136   WBC 6.3 08/12/2012 1049   RBC 4.61 08/29/2013 1136   RBC 4.53 08/12/2012 1049   HGB 13.6 08/29/2013 1136   HGB 13.3 08/12/2012 1049   HCT 41.4 08/29/2013 1136   HCT 38.7* 08/12/2012 1049   PLT 116* 08/29/2013 1136   PLT 118* 08/12/2012 1049   MCV 89.8 08/29/2013 1136   MCV 85.4 08/12/2012 1049   MCH 29.6 08/29/2013 1136   MCH 29.4 08/12/2012 1049   MCHC 33.0 08/29/2013 1136   MCHC 34.4 08/12/2012 1049   RDW 15.9* 08/29/2013 1136   RDW 13.5 08/12/2012 1049   LYMPHSABS 0.8* 08/29/2013 1136   MONOABS 0.4 08/29/2013 1136   EOSABS 0.0 08/29/2013 1136   BASOSABS 0.0 08/29/2013 1136    BMET    Component Value Date/Time   NA 139 08/29/2013 1136   NA 141 06/19/2012 0800   K 3.4* 08/29/2013 1136   K 3.9 06/19/2012 0800   CL 103 09/02/2012 0955   CL 105 06/19/2012 0800   CO2 22 08/29/2013 1136  CO2 25 06/19/2012 0800   GLUCOSE  96 08/29/2013 1136   GLUCOSE 85 09/02/2012 0955   GLUCOSE 73 06/19/2012 0800   BUN 22.1 08/29/2013 1136   BUN 18 06/19/2012 0800   CREATININE 0.9 08/29/2013 1136   CREATININE 0.86 06/19/2012 0800   CALCIUM 9.3 08/29/2013 1136   CALCIUM 9.5 06/19/2012 0800   GFRNONAA >90 06/19/2012 0800   GFRAA >90 06/19/2012 0800    CMP     Component Value Date/Time   NA 139 08/29/2013 1136   NA 141 06/19/2012 0800   K 3.4* 08/29/2013 1136   K 3.9 06/19/2012 0800   CL 103 09/02/2012 0955   CL 105 06/19/2012 0800   CO2 22 08/29/2013 1136   CO2 25 06/19/2012 0800   GLUCOSE 96 08/29/2013 1136   GLUCOSE 85 09/02/2012 0955   GLUCOSE 73 06/19/2012 0800   BUN 22.1 08/29/2013 1136   BUN 18 06/19/2012 0800   CREATININE 0.9 08/29/2013 1136   CREATININE 0.86 06/19/2012 0800   CALCIUM 9.3 08/29/2013 1136   CALCIUM 9.5 06/19/2012 0800   PROT 6.9 08/29/2013 1136   ALBUMIN 3.9 08/29/2013 1136   AST 16 08/29/2013 1136   ALT 38 08/29/2013 1136   ALKPHOS 89 08/29/2013 1136   BILITOT 0.21 08/29/2013 1136   GFRNONAA >90 06/19/2012 0800   GFRAA >90 06/19/2012 0800     Time In Time Out Total Time Spent with Patient Total Overall Time  1645 1745 50 min 60 min    Greater than 50%  of this time was spent counseling and coordinating care related to the above assessment and plan.   Wadie Lessen NP  Palliative Medicine Team Team Phone # [phone] Pager [phone]  Discussed with Dr  Tammi Klippel

## 2013-09-05 NOTE — Progress Notes (Signed)
  Radiation Oncology         [phone]) [phone] ________________________________  Name: Terrance Clark  MRN: [id number]  Date: 09/04/2013  DOB: February 15, 1976  End of Treatment Note  Diagnosis:  38 yo man with 3 new subcentimeter brain metastases from non-small cell lung cancer  Indication for treatment:  Palliation, Local Control       Radiation treatment dates:   09/04/2013  Site/dose/beams/energy:    -  Right occiptal 4.5 mm target was treated using 3 Circular Arcs to a prescription dose of 20 Gy. ExacTrac registration was performed for each couch angle. The 78.7% isodose line was prescribed. The 7.5 mm collimator was used.  -  Right cerebellar 2.5 mm target was treated using 3 Circular Arcs to a prescription dose of 20 Gy. ExacTrac registration was performed for each couch angle. The 77.5% isodose line was prescribed. The 6 mm collimator was used.  -  Right lateral ventricle 2.5 mm target was treated using 3 Circular Arcs to a prescription dose of 20 Gy. ExacTrac registration was performed for each couch angle. The 78.7% isodose line was prescribed. The 6 mm collimator was used  Narrative: The patient tolerated radiation treatment relatively well.     Plan: The patient has completed radiation treatment. The patient will return to radiation oncology clinic for routine followup in one month. I advised them to call or return sooner if they have any questions or concerns related to their recovery or treatment. ________________________________  Sheral Apley. Tammi Klippel, M.D.

## 2013-09-08 ENCOUNTER — Ambulatory Visit: Payer: 59

## 2013-09-10 DIAGNOSIS — R5383 Other fatigue: Secondary | ICD-10-CM

## 2013-09-10 DIAGNOSIS — Z66 Do not resuscitate: Secondary | ICD-10-CM | POA: Insufficient documentation

## 2013-09-10 DIAGNOSIS — R5381 Other malaise: Secondary | ICD-10-CM | POA: Insufficient documentation

## 2013-09-10 DIAGNOSIS — Z7189 Other specified counseling: Secondary | ICD-10-CM | POA: Insufficient documentation

## 2013-09-16 NOTE — Consult Note (Signed)
I have reviewed this case with our NP and agree with the Assessment and Plan as stated.  Melissa L. Taylor, MD MBA The Palliative Medicine Team at [place] Team Phone: [phone] Pager: [phone]   

## 2013-09-18 ENCOUNTER — Telehealth: Payer: Self-pay

## 2013-09-18 NOTE — Telephone Encounter (Signed)
Call from [place]. Navigator that patient needs refill on dexamethasone as he stated he took last pill today.I called patient to check  bottle as he has 3 refills at [facility].He verified he still has 3 refills.I have called [place] to have them get ready for patient  to pick up today and then called [facility] to pick up prior to 6:00 pm.

## 2013-10-12 ENCOUNTER — Encounter: Payer: Self-pay | Admitting: Radiation Oncology

## 2013-10-12 NOTE — Progress Notes (Signed)
  Radiation Oncology         [phone]) [phone] ________________________________  Name: Terrance Clark  MRN: [id number]  Date: 10/13/2013  DOB: 11-22-1975  NO SHOW

## 2013-10-13 ENCOUNTER — Ambulatory Visit
Admission: RE | Admit: 2013-10-13 | Discharge: 2013-10-13 | Disposition: A | Discharge status: Home / Self Care | Payer: 59 | Source: Ambulatory Visit | Attending: Radiation Oncology | Admitting: Radiation Oncology

## 2013-10-13 ENCOUNTER — Telehealth: Payer: Self-pay | Admitting: Radiation Oncology

## 2013-10-13 ENCOUNTER — Ambulatory Visit (HOSPITAL_COMMUNITY): Payer: 59

## 2013-10-13 DIAGNOSIS — C3490 Malignant neoplasm of unspecified part of unspecified bronchus or lung: Secondary | ICD-10-CM

## 2013-10-13 DIAGNOSIS — C7931 Secondary malignant neoplasm of brain: Secondary | ICD-10-CM

## 2013-10-13 NOTE — Telephone Encounter (Signed)
Patient a no show for 0900 appointment. Phoned patient at home no answer. Left message requesting return call to reschedule.

## 2013-10-15 ENCOUNTER — Ambulatory Visit: Payer: 59 | Admitting: Internal Medicine

## 2013-10-15 ENCOUNTER — Other Ambulatory Visit: Payer: 59

## 2013-10-20 ENCOUNTER — Telehealth: Payer: Self-pay | Admitting: Radiation Oncology

## 2013-10-20 ENCOUNTER — Other Ambulatory Visit: Payer: Self-pay | Admitting: Radiation Oncology

## 2013-10-20 ENCOUNTER — Telehealth: Payer: Self-pay | Admitting: Medical Oncology

## 2013-10-20 DIAGNOSIS — C7931 Secondary malignant neoplasm of brain: Secondary | ICD-10-CM

## 2013-10-20 DIAGNOSIS — C3490 Malignant neoplasm of unspecified part of unspecified bronchus or lung: Secondary | ICD-10-CM

## 2013-10-20 DIAGNOSIS — C7949 Secondary malignant neoplasm of other parts of nervous system: Principal | ICD-10-CM

## 2013-10-20 MED ORDER — OXYCODONE HCL 5 MG PO TABS: 5.0000 mg | ORAL_TABLET | ORAL | Status: DC | PRN

## 2013-10-20 NOTE — Telephone Encounter (Signed)
Phoned patient. Left message explaining pain script is ready for pick up in the rad onc nursing station. Encouraged to call with questions and advised to bring drivers license for pick up.

## 2013-10-20 NOTE — Telephone Encounter (Signed)
I returned pt call-no answer so I left message to call back.

## 2013-10-22 ENCOUNTER — Other Ambulatory Visit: Payer: Self-pay | Admitting: Radiation Therapy

## 2013-10-22 DIAGNOSIS — C7931 Secondary malignant neoplasm of brain: Secondary | ICD-10-CM

## 2013-10-22 DIAGNOSIS — C7949 Secondary malignant neoplasm of other parts of nervous system: Principal | ICD-10-CM

## 2013-11-29 ENCOUNTER — Emergency Department (HOSPITAL_COMMUNITY): Payer: 59

## 2013-11-29 ENCOUNTER — Encounter (HOSPITAL_COMMUNITY): Payer: Self-pay | Admitting: Emergency Medicine

## 2013-11-29 ENCOUNTER — Emergency Department (HOSPITAL_COMMUNITY)
Admission: EM | Admit: 2013-11-29 | Discharge: 2013-11-29 | Disposition: A | Discharge status: Home / Self Care | Payer: 59 | Attending: Emergency Medicine | Admitting: Emergency Medicine

## 2013-11-29 DIAGNOSIS — IMO0002 Reserved for concepts with insufficient information to code with codable children: Secondary | ICD-10-CM | POA: Insufficient documentation

## 2013-11-29 DIAGNOSIS — C3490 Malignant neoplasm of unspecified part of unspecified bronchus or lung: Secondary | ICD-10-CM

## 2013-11-29 DIAGNOSIS — C7800 Secondary malignant neoplasm of unspecified lung: Secondary | ICD-10-CM | POA: Insufficient documentation

## 2013-11-29 DIAGNOSIS — C7949 Secondary malignant neoplasm of other parts of nervous system: Secondary | ICD-10-CM

## 2013-11-29 DIAGNOSIS — M545 Low back pain, unspecified: Secondary | ICD-10-CM | POA: Insufficient documentation

## 2013-11-29 DIAGNOSIS — K5900 Constipation, unspecified: Secondary | ICD-10-CM | POA: Insufficient documentation

## 2013-11-29 DIAGNOSIS — Z85841 Personal history of malignant neoplasm of brain: Secondary | ICD-10-CM | POA: Diagnosis not present

## 2013-11-29 DIAGNOSIS — F17200 Nicotine dependence, unspecified, uncomplicated: Secondary | ICD-10-CM | POA: Insufficient documentation

## 2013-11-29 DIAGNOSIS — Z79899 Other long term (current) drug therapy: Secondary | ICD-10-CM | POA: Insufficient documentation

## 2013-11-29 DIAGNOSIS — C7931 Secondary malignant neoplasm of brain: Secondary | ICD-10-CM

## 2013-11-29 MED ORDER — KETOROLAC TROMETHAMINE 30 MG/ML IJ SOLN
30.0000 mg | Freq: Once | INTRAMUSCULAR | Status: AC
  Administered 2013-11-29: 30 mg via INTRAVENOUS
  Filled 2013-11-29: qty 1

## 2013-11-29 MED ORDER — POLYETHYLENE GLYCOL 3350 17 G PO PACK
17.0000 g | PACK | Freq: Every day | ORAL | Status: DC
Start: 2013-11-29 — End: 2013-12-22

## 2013-11-29 MED ORDER — HYDROMORPHONE HCL 1 MG/ML IJ SOLN
1.0000 mg | Freq: Once | INTRAMUSCULAR | Status: AC
  Administered 2013-11-29: 1 mg via INTRAMUSCULAR
  Filled 2013-11-29: qty 1

## 2013-11-29 MED ORDER — OXYCODONE HCL 5 MG PO TABS: 5.0000 mg | ORAL_TABLET | ORAL | Status: DC | PRN

## 2013-11-29 MED ORDER — CYCLOBENZAPRINE HCL 10 MG PO TABS: 10.0000 mg | ORAL_TABLET | Freq: Two times a day (BID) | ORAL | Status: DC | PRN

## 2013-11-29 NOTE — ED Provider Notes (Signed)
CSN: [id number]     Arrival date & time 11/29/13  0708 History   First MD Initiated Contact with Patient 11/29/13 0735     Chief Complaint  Patient presents with  . Back Pain    uncontrolled  . Constipation    2 weeks last BM     (Consider location/radiation/quality/duration/timing/severity/associated sxs/prior Treatment) Patient is a 38 y.o. male presenting with back pain and constipation. The history is provided by the patient.  Back Pain Location:  Lumbar spine Associated symptoms: no abdominal pain, no chest pain, no headaches, no numbness and no weakness   Constipation Associated symptoms: back pain   Associated symptoms: no abdominal pain, no diarrhea, no nausea and no vomiting    patient with pain in his lower back. Worse with movement. No trauma. Has been taking oxycodone and it initially would relieve the pain. Or last 2 days it is not relieved it. No numbness or weakness. No loss of bladder or bowel control. He does have constipation. Last good bowel movement was 2 weeks ago. Has had some relief with laxatives since. He has metastatic lung cancer and is on radiation therapy. 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 No previous spinal metastases. No fevers. No nausea vomiting. No diarrhea.  Past Medical History  Diagnosis Date  . H/O oral surgery 04/2012  . Cough   . Shortness of breath     with exertion  . Insomnia     d/t pain and takes Goody's PM  . Hx of radiation therapy 07/16/12- 09/10/12    lower neck and upper chest region , 63 gray in 35 fx  . Hx of radiation therapy 07/18/12    SRS to brain metastasis  . Lung cancer 06/2012    RUL lung  . 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 Cancer     adrenal mets   Past Surgical History  Procedure Laterality Date  . Foot surgery    . Mouth surgery    . Video bronchoscopy with endobronchial ultrasound Right 06/19/2012    Procedure: VIDEO BRONCHOSCOPY WITH ENDOBRONCHIAL ULTRASOUND;  Surgeon: Collene Gobble, MD;  Location: [facility];  Service: Pulmonary;  Laterality: Right;   Family History  Problem Relation Age of Onset  . Cancer Maternal Aunt     unknown  . Cancer Mother     breast ca 2007, surgery right breast,chemotherapy,no rad txx  . Cancer Maternal Grandfather     colon   History  Substance Use Topics  . Smoking status: Current Some Day Smoker -- 1.00 packs/day for 22 years    Types: Cigarettes  . Smokeless tobacco: Never Used     Comment: 2 cigs since 06/03/12///ldc  . Alcohol Use: No    Review of Systems  Constitutional: Negative for activity change and appetite change.  Eyes: Negative for pain.  Respiratory: Negative for chest tightness and shortness of breath.   Cardiovascular: Negative for chest pain and leg swelling.  Gastrointestinal: Positive for constipation. Negative for nausea, vomiting, abdominal pain and diarrhea.  Genitourinary: Negative for flank pain.  Musculoskeletal: Positive for back pain. Negative for neck stiffness.  Skin: Negative for rash.  Neurological: Negative for weakness, numbness and headaches.  Psychiatric/Behavioral: Negative for behavioral problems.      Allergies  Review of patient's allergies indicates no known allergies.  Home Medications   Prior to Admission medications   Medication Sig Start Date End Date Taking? Authorizing Provider  dexamethasone (DECADRON) 4 MG tablet Take 0.5  tablets (2 mg total) by mouth 2 (two) times daily. 08/18/13  Yes Lora Paula, MD  cyclobenzaprine (FLEXERIL) 10 MG tablet Take 1 tablet (10 mg total) by mouth 2 (two) times daily as needed for muscle spasms. 11/29/13   Jasper Riling. Danniela Mcbrearty, MD  oxyCODONE (OXY IR/ROXICODONE) 5 MG immediate release tablet Take 1-3 tablets (5-15 mg total) by mouth every 4 (four) hours as needed for severe pain. 11/29/13   Jasper Riling. Nainika Newlun, MD  polyethylene glycol (MIRALAX / GLYCOLAX) packet Take 17 g by mouth daily. 11/29/13   Jasper Riling. Cougar Imel, MD   BP 118/83  Pulse 71  Temp(Src) 98.6 F  (37 C) (Oral)  Resp 13  Ht 5' 10"  (1.778 m)  Wt 147 lb (66.679 kg)  BMI 21.09 kg/m2  SpO2 98% Physical Exam  Nursing note and vitals reviewed. Constitutional: He is oriented to person, place, and time. He appears well-developed and well-nourished.  HENT:  Head: Normocephalic and atraumatic.  Eyes: EOM are normal. Pupils are equal, round, and reactive to light.  Neck: Normal range of motion. Neck supple.  Cardiovascular: Normal rate, regular rhythm and normal heart sounds.   No murmur heard. Pulmonary/Chest: Effort normal and breath sounds normal.  Abdominal: Soft. Bowel sounds are normal. He exhibits no distension and no mass. There is no tenderness. There is no rebound and no guarding.  Musculoskeletal: Normal range of motion. He exhibits tenderness. He exhibits no edema.  Lower lumbar tenderness. No step-off or deformity. Some muscle spasm. Mild pain with straight leg raise bilaterally. Sensation intact bilateral extremities. Good strength in bilateral lower extremities.  Neurological: He is alert and oriented to person, place, and time. No cranial nerve deficit.  Skin: Skin is warm and dry.  Psychiatric: He has a normal mood and affect.    ED Course  Procedures (including critical care time) Labs Review Labs Reviewed - No data to display  Imaging Review Dg Lumbar Spine Complete  11/29/2013   CLINICAL DATA:  Low back pain for the past 2 weeks. Metastatic lung cancer.  EXAM: LUMBAR SPINE - COMPLETE 4+ VIEW  COMPARISON:  Abdomen and pelvis CT dated 04/09/2013.  FINDINGS: Five non-rib-bearing lumbar vertebrae. Minimal levoconvex scoliosis. No bony abnormalities. No fractures, pars defects or subluxations. Mild atheromatous arterial calcifications.  IMPRESSION: Normal appearing lumbar spine   Electronically Signed   By: Enrique Sack M.D.   On: 11/29/2013 09:30   Dg Abd 2 Views  11/29/2013   CLINICAL DATA:  Lower abdominal pain for the past 2 weeks with no bowel movement. Metastatic  lung cancer.  EXAM: ABDOMEN - 2 VIEW  COMPARISON:  Abdomen and pelvis CT dated 04/09/2013.  FINDINGS: Normal bowel gas pattern without free peritoneal air. Normal amount of stool. Probable partially distended urinary bladder in the inferior pelvis.  IMPRESSION: No acute abnormality.   Electronically Signed   By: Enrique Sack M.D.   On: 11/29/2013 09:32     EKG Interpretation None      MDM   Final diagnoses:  Midline low back pain without sciatica    Patient with back pain. Has history of bulging disc. This likely the cause. X-ray done and does not show metastatic disease. Abdominal xray does not show severe obstruction. Has had previous intra-abdominal metastases. May be disc, but metastatic disease considered. At this point he does not appear to be severely obstructed. Pain improved somewhat. Will discharge to followup with his oncologist.    Jasper Riling. Alvino Chapel, MD 11/30/13 [phone]

## 2013-11-29 NOTE — Discharge Instructions (Signed)
Back Pain, Adult Low back pain is very common. About 1 in 5 people have back pain.The cause of low back pain is rarely dangerous. The pain often gets better over time.About half of people with a sudden onset of back pain feel better in just 2 weeks. About 8 in 10 people feel better by 6 weeks.  CAUSES Some common causes of back pain include:  Strain of the muscles or ligaments supporting the spine.  Wear and tear (degeneration) of the spinal discs.  Arthritis.  Direct injury to the back. DIAGNOSIS Most of the time, the direct cause of low back pain is not known.However, back pain can be treated effectively even when the exact cause of the pain is unknown.Answering your caregiver's questions about your overall health and symptoms is one of the most accurate ways to make sure the cause of your pain is not dangerous. If your caregiver needs more information, he or she may order lab work or imaging tests (X-rays or MRIs).However, even if imaging tests show changes in your back, this usually does not require surgery. HOME CARE INSTRUCTIONS For many people, back pain returns.Since low back pain is rarely dangerous, it is often a condition that people can learn to manageon their own.   Remain active. It is stressful on the back to sit or stand in one place. Do not sit, drive, or stand in one place for more than 30 minutes at a time. Take short walks on level surfaces as soon as pain allows.Try to increase the length of time you walk each day.  Do not stay in bed.Resting more than 1 or 2 days can delay your recovery.  Do not avoid exercise or work.Your body is made to move.It is not dangerous to be active, even though your back may hurt.Your back will likely heal faster if you return to being active before your pain is gone.  Pay attention to your body when you bend and lift. Many people have less discomfortwhen lifting if they bend their knees, keep the load close to their bodies,and  avoid twisting. Often, the most comfortable positions are those that put less stress on your recovering back.  Find a comfortable position to sleep. Use a firm mattress and lie on your side with your knees slightly bent. If you lie on your back, put a pillow under your knees.  Only take over-the-counter or prescription medicines as directed by your caregiver. Over-the-counter medicines to reduce pain and inflammation are often the most helpful.Your caregiver may prescribe muscle relaxant drugs.These medicines help dull your pain so you can more quickly return to your normal activities and healthy exercise.  Put ice on the injured area.  Put ice in a plastic bag.  Place a towel between your skin and the bag.  Leave the ice on for 15-20 minutes, 03-04 times a day for the first 2 to 3 days. After that, ice and heat may be alternated to reduce pain and spasms.  Ask your caregiver about trying back exercises and gentle massage. This may be of some benefit.  Avoid feeling anxious or stressed.Stress increases muscle tension and can worsen back pain.It is important to recognize when you are anxious or stressed and learn ways to manage it.Exercise is a great option. SEEK MEDICAL CARE IF:  You have pain that is not relieved with rest or medicine.  You have pain that does not improve in 1 week.  You have new symptoms.  You are generally not feeling well. SEEK   IMMEDIATE MEDICAL CARE IF:   You have pain that radiates from your back into your legs.  You develop new bowel or bladder control problems.  You have unusual weakness or numbness in your arms or legs.  You develop nausea or vomiting.  You develop abdominal pain.  You feel faint. Document Released: 02/27/2005 Document Revised: 08/29/2011 Document Reviewed: 07/01/2013 Exit[employer] Patient Information 2015 [employer]. This information is not intended to replace advice given to you by your health care provider. Make sure you  discuss any questions you have with your health care provider.  

## 2013-11-29 NOTE — ED Notes (Signed)
Pt reports back pain that is not controlled with current Pain meds . Pt reports last BM 2 weeks ago. Pt i currently receiving chemo for Lung CA.

## 2013-12-10 ENCOUNTER — Emergency Department (HOSPITAL_COMMUNITY)
Admission: EM | Admit: 2013-12-10 | Discharge: 2013-12-11 | Disposition: A | Discharge status: Home / Self Care | Payer: 59 | Attending: Emergency Medicine | Admitting: Emergency Medicine

## 2013-12-10 ENCOUNTER — Emergency Department (HOSPITAL_COMMUNITY): Payer: 59

## 2013-12-10 ENCOUNTER — Encounter (HOSPITAL_COMMUNITY): Payer: Self-pay | Admitting: Emergency Medicine

## 2013-12-10 DIAGNOSIS — C787 Secondary malignant neoplasm of liver and intrahepatic bile duct: Secondary | ICD-10-CM | POA: Insufficient documentation

## 2013-12-10 DIAGNOSIS — C786 Secondary malignant neoplasm of retroperitoneum and peritoneum: Secondary | ICD-10-CM | POA: Diagnosis not present

## 2013-12-10 DIAGNOSIS — F17210 Nicotine dependence, cigarettes, uncomplicated: Secondary | ICD-10-CM | POA: Insufficient documentation

## 2013-12-10 DIAGNOSIS — Z85118 Personal history of other malignant neoplasm of bronchus and lung: Secondary | ICD-10-CM | POA: Insufficient documentation

## 2013-12-10 DIAGNOSIS — C772 Secondary and unspecified malignant neoplasm of intra-abdominal lymph nodes: Secondary | ICD-10-CM

## 2013-12-10 DIAGNOSIS — Z9889 Other specified postprocedural states: Secondary | ICD-10-CM | POA: Diagnosis not present

## 2013-12-10 DIAGNOSIS — Z79899 Other long term (current) drug therapy: Secondary | ICD-10-CM | POA: Diagnosis not present

## 2013-12-10 DIAGNOSIS — Z923 Personal history of irradiation: Secondary | ICD-10-CM | POA: Diagnosis not present

## 2013-12-10 DIAGNOSIS — M545 Low back pain, unspecified: Secondary | ICD-10-CM | POA: Diagnosis present

## 2013-12-10 DIAGNOSIS — R1110 Vomiting, unspecified: Secondary | ICD-10-CM | POA: Diagnosis not present

## 2013-12-10 DIAGNOSIS — R1084 Generalized abdominal pain: Secondary | ICD-10-CM | POA: Diagnosis not present

## 2013-12-10 DIAGNOSIS — Z85841 Personal history of malignant neoplasm of brain: Secondary | ICD-10-CM | POA: Diagnosis not present

## 2013-12-10 DIAGNOSIS — C801 Malignant (primary) neoplasm, unspecified: Secondary | ICD-10-CM

## 2013-12-10 DIAGNOSIS — F17200 Nicotine dependence, unspecified, uncomplicated: Secondary | ICD-10-CM | POA: Diagnosis not present

## 2013-12-10 DIAGNOSIS — R1111 Vomiting without nausea: Secondary | ICD-10-CM | POA: Insufficient documentation

## 2013-12-10 LAB — CBC WITH DIFFERENTIAL/PLATELET
Basophils Absolute: 0 10*3/uL (ref 0.0–0.1)
Basophils Relative: 0 % (ref 0–1)
Eosinophils Absolute: 0 10*3/uL (ref 0.0–0.7)
Eosinophils Relative: 0 % (ref 0–5)
HEMATOCRIT: 37.4 % — AB (ref 39.0–52.0)
HEMOGLOBIN: 12.5 g/dL — AB (ref 13.0–17.0)
LYMPHS ABS: 0.8 10*3/uL (ref 0.7–4.0)
LYMPHS PCT: 9 % — AB (ref 12–46)
MCH: 29.3 pg (ref 26.0–34.0)
MCHC: 33.4 g/dL (ref 30.0–36.0)
MCV: 87.8 fL (ref 78.0–100.0)
MONO ABS: 0.9 10*3/uL (ref 0.1–1.0)
MONOS PCT: 10 % (ref 3–12)
NEUTROS ABS: 7.4 10*3/uL (ref 1.7–7.7)
Neutrophils Relative %: 81 % — ABNORMAL HIGH (ref 43–77)
Platelets: 263 10*3/uL (ref 150–400)
RBC: 4.26 MIL/uL (ref 4.22–5.81)
RDW: 12.6 % (ref 11.5–15.5)
WBC: 9.1 10*3/uL (ref 4.0–10.5)

## 2013-12-10 LAB — COMPREHENSIVE METABOLIC PANEL
ALT: 11 U/L (ref 0–53)
AST: 17 U/L (ref 0–37)
Albumin: 3.4 g/dL — ABNORMAL LOW (ref 3.5–5.2)
Alkaline Phosphatase: 145 U/L — ABNORMAL HIGH (ref 39–117)
Anion gap: 16 — ABNORMAL HIGH (ref 5–15)
BUN: 14 mg/dL (ref 6–23)
CHLORIDE: 100 meq/L (ref 96–112)
CO2: 23 mEq/L (ref 19–32)
CREATININE: 0.98 mg/dL (ref 0.50–1.35)
Calcium: 9.3 mg/dL (ref 8.4–10.5)
GFR calc Af Amer: >90 mL/min (ref >90)
GFR calc non Af Amer: >90 mL/min (ref >90)
Glucose, Bld: 77 mg/dL (ref 70–99)
Potassium: 4.2 mEq/L (ref 3.7–5.3)
Sodium: 139 mEq/L (ref 137–147)
Total Protein: 6.8 g/dL (ref 6.0–8.3)

## 2013-12-10 LAB — URINALYSIS, ROUTINE W REFLEX MICROSCOPIC
BILIRUBIN URINE: NEGATIVE
Glucose, UA: NEGATIVE mg/dL
HGB URINE DIPSTICK: NEGATIVE
Ketones, ur: NEGATIVE mg/dL
Leukocytes, UA: NEGATIVE
Nitrite: NEGATIVE
PH: 5.5 (ref 5.0–8.0)
Protein, ur: NEGATIVE mg/dL
Specific Gravity, Urine: 1.025 (ref 1.005–1.030)
UROBILINOGEN UA: 0.2 mg/dL (ref 0.0–1.0)

## 2013-12-10 LAB — LIPASE, BLOOD: LIPASE: 17 U/L (ref 11–59)

## 2013-12-10 MED ORDER — SODIUM CHLORIDE 0.9 % IV BOLUS (SEPSIS)
1000.0000 mL | Freq: Once | INTRAVENOUS | Status: AC
  Administered 2013-12-10: 1000 mL via INTRAVENOUS

## 2013-12-10 MED ORDER — HYDROMORPHONE HCL 1 MG/ML IJ SOLN
1.0000 mg | Freq: Once | INTRAMUSCULAR | Status: AC
  Administered 2013-12-10: 1 mg via INTRAVENOUS
  Filled 2013-12-10: qty 1

## 2013-12-10 MED ORDER — IOHEXOL 300 MG/ML  SOLN
100.0000 mL | Freq: Once | INTRAMUSCULAR | Status: AC | PRN
  Administered 2013-12-10: 100 mL via INTRAVENOUS

## 2013-12-10 MED ORDER — ONDANSETRON HCL 4 MG/2ML IJ SOLN
4.0000 mg | Freq: Once | INTRAMUSCULAR | Status: AC
  Administered 2013-12-10: 4 mg via INTRAVENOUS
  Filled 2013-12-10: qty 2

## 2013-12-10 NOTE — ED Provider Notes (Signed)
CSN: [id number]     Arrival date & time 12/10/13  1810 History  This chart was scribed for non-physician practitioner, Domenic Moras, PA-C, working with Leota Jacobsen, MD, by Jeanell Sparrow, ED Scribe. This patient was seen in room WTR8/WTR8 and the patient's care was started at 6:18 PM.  Chief Complaint  Patient presents with  . Abdominal Pain  . Back Pain   The history is provided by the patient. No language interpreter was used.   HPI Comments: Terrance Clark is a 38 y.o. male who presents to the Emergency Department complaining of constant moderate lower back pain that started about 4 weeks ago. He states that he went to the doctor and was given pain medication with relief. He reports that later on he took about 11 pain pills a day without any relief. He states that the pain radiates that his abdomen. He describes the pain as a stabbing sensation. He rates the severity of the pain currently as a 10/10. He reports that he has been having 3 episodes of associated emesis everyday for the past 2-3 weeks. He states that he has also has chills. He reports that his BMs have been regular especially after receiving stool softener prescribed from prior visit. He states that he has a hx of cancer that was treated with radiation and chemo. He reports that he is seeing his oncologist next month. He denies any fever or numbness in his lower extremities.  Denies dysuria.  Pt noticed skin changes to his lower back for the past 2 weeks, report skin in lower back is sensitive to the touch.  Denies any recent trauma.    Past Medical History  Diagnosis Date  . H/O oral surgery 04/2012  . Cough   . Shortness of breath     with exertion  . Insomnia     d/t pain and takes Goody's PM  . Hx of radiation therapy 07/16/12- 09/10/12    lower neck and upper chest region , 63 gray in 35 fx  . Hx of radiation therapy 07/18/12    SRS to brain metastasis  . Lung cancer 06/2012    RUL lung  . 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 Cancer      adrenal mets   Past Surgical History  Procedure Laterality Date  . Foot surgery    . Mouth surgery    . Video bronchoscopy with endobronchial ultrasound Right 06/19/2012    Procedure: VIDEO BRONCHOSCOPY WITH ENDOBRONCHIAL ULTRASOUND;  Surgeon: Collene Gobble, MD;  Location: [facility];  Service: Pulmonary;  Laterality: Right;   Family History  Problem Relation Age of Onset  . Cancer Maternal Aunt     unknown  . Cancer Mother     breast ca 2007, surgery right breast,chemotherapy,no rad txx  . Cancer Maternal Grandfather     colon   History  Substance Use Topics  . Smoking status: Current Some Day Smoker -- 1.00 packs/day for 22 years    Types: Cigarettes  . Smokeless tobacco: Never Used     Comment: 2 cigs since 06/03/12///ldc  . Alcohol Use: No    Review of Systems  Constitutional: Negative for fever.  Gastrointestinal: Positive for vomiting and abdominal pain.  Musculoskeletal: Positive for back pain.  Neurological: Negative for numbness.  All other systems reviewed and are negative.  Allergies  Review of patient's allergies indicates no known allergies.  Home Medications   Prior to Admission medications   Medication Sig Start Date End Date  Taking? Authorizing Provider  cyclobenzaprine (FLEXERIL) 10 MG tablet Take 1 tablet (10 mg total) by mouth 2 (two) times daily as needed for muscle spasms. 11/29/13   Jasper Riling. Pickering, MD  dexamethasone (DECADRON) 4 MG tablet Take 0.5 tablets (2 mg total) by mouth 2 (two) times daily. 08/18/13   Lora Paula, MD  oxyCODONE (OXY IR/ROXICODONE) 5 MG immediate release tablet Take 1-3 tablets (5-15 mg total) by mouth every 4 (four) hours as needed for severe pain. 11/29/13   Jasper Riling. Pickering, MD  polyethylene glycol (MIRALAX / GLYCOLAX) packet Take 17 g by mouth daily. 11/29/13   Jasper Riling. Pickering, MD   BP 152/89  Pulse 111  Temp(Src) 98.2 F (36.8 C) (Oral)  Resp 15  SpO2 100% Physical Exam  Nursing note and vitals  reviewed. Constitutional: He is oriented to person, place, and time. He appears well-developed and well-nourished.  HENT:  Head: Normocephalic and atraumatic.  Neck: Neck supple. No tracheal deviation present.  Cardiovascular: Normal rate.   Pulmonary/Chest: Effort normal. No respiratory distress.  Abdominal: There is tenderness.  Diffuse abdominal TTP   Musculoskeletal: Normal range of motion. He exhibits tenderness.  TTP throughout the entire spine especially in the lumbar and paraspinal area.   Neurological: He is alert and oriented to person, place, and time. He has normal reflexes.  Sensations intact. Pt able to ambulate. No foot drops.    Intact distal pulses  Skin: Skin is warm and dry.  Evidence of mottled skin changes in a reticular formation throughout lower back.  Psychiatric: He has a normal mood and affect. His behavior is normal.    ED Course  Procedures (including critical care time) DIAGNOSTIC STUDIES: Oxygen Saturation is 100% on RA, normal by my interpretation.    COORDINATION OF CARE: 6:22 PM- Pt advised of plan for treatment which includes medication, radiology, and labs and pt agrees.  6:41 PM Given hx of metastatic cancer here with progressive worsening lower back pain now radiates to abdomen, my concern is for metastatic disease.  Pt has had prior xray of lower back without malignancy.  However, he will need abd/pelvic CT to evaluate further.  No radicular pain on exam.  Pt also nauseous and has vomited on a consistent basis.  Although SBO is low on my differential, pt will need to be evaluated for that as well.  Care discussed with my PA partner, who will continue with management.  Pt is tachycardic, likely from pain but pt does appears dehydrated, IVF started, pain medication and antinausea medication provided.  Labs and Xray ordered.    Labs Review Labs Reviewed - No data to display  Imaging Review No results found.   EKG Interpretation None       MDM   Final diagnoses:  None    BP 152/89  Pulse 111  Temp(Src) 98.2 F (36.8 C) (Oral)  Resp 15  SpO2 100%    I personally performed the services described in this documentation, which was scribed in my presence. The recorded information has been reviewed and is accurate.      Domenic Moras, PA-C 12/10/13 [place], PA-C 12/10/13 3664

## 2013-12-10 NOTE — ED Notes (Signed)
Pt states he has n/v along with abdominal pain and back pain. Pt states he was seen on the 9/15 at [facility] and is a cancer pt. Pt has bruising to his lower back denies falling states it started 2wks.

## 2013-12-10 NOTE — ED Provider Notes (Signed)
Medical screening examination/treatment/procedure(s) were conducted as a shared visit with non-physician practitioner(s) and myself.  I personally evaluated the patient during the encounter.   EKG Interpretation None     Pt seen and examined, tenderness and mottling noted along l/s spine, no focal neuro deficits, awaiting MRI  Leota Jacobsen, MD 12/10/13 2323

## 2013-12-10 NOTE — ED Provider Notes (Signed)
Terrance Clark is a 38 y.o. male presents with back pain that radiates to the abd.  Pt with Hx of lung cancer and sees Dr. Neil Crouch.  Pt was seen 2 weeks ago with back pain and no mets were seen to the spine at that time.  Pt reports his significant other reports that the discoloration of his back began today.  Pt is taking oxycodone, dexamethasone and flexeril for pain without relief with acute worsening today.  Pt reports all movement makes things worse and nothing makes it better.    Physical Exam  BP 152/89  Pulse 111  Temp(Src) 98.2 F (36.8 C) (Oral)  Resp 15  SpO2 100%  Physical Exam  Nursing note and vitals reviewed. Constitutional: He appears well-developed and well-nourished. No distress.  HENT:  Head: Normocephalic and atraumatic.  Mouth/Throat: Oropharynx is clear and moist. No oropharyngeal exudate.  Eyes: Conjunctivae are normal.  Neck: Normal range of motion. Neck supple.  Full ROM without pain  Cardiovascular: Regular rhythm, normal heart sounds and intact distal pulses.   tachycardia  Pulmonary/Chest: Effort normal and breath sounds normal. No respiratory distress. He has no wheezes.  Abdominal: Soft. He exhibits no distension. There is no tenderness.  Musculoskeletal:  Full range of motion of the T-spine and L-spine Tenderness to palpation of the spinous processes of the T-spine or L-spine Tenderness to palpation of the paraspinous muscles of the T-spine and L-spine  Lymphadenopathy:    He has no cervical adenopathy.  Neurological: He is alert. He has normal reflexes. Coordination normal.  Speech is clear and goal oriented, follows commands Normal 5/5 strength in upper bilaterally including strong and equal grip strength Normal 5/5 strength in right lower extremity bilaterally including dorsiflexion and plantar flexion Decreased 3/5 strength in left lower extremity bilaterally including dorsiflexion and plantar flexion Sensation normal to light and sharp  touch Moves extremities without ataxia, coordination intact Normal gait Normal balance   Skin: Skin is warm and dry. No rash noted. He is not diaphoretic. No erythema.  Mottling noted over the entire lower back   Psychiatric: He has a normal mood and affect. His behavior is normal.    ED Course  Procedures  MDM  Donah Driver presents with significant worsening of back pain with new and gradually worsening generalized abdominal pain.  Patient with history of lung cancer on chemotherapy and radiation. Concern for potential abdominal and spinal metastasis.  Will obtain labs and CT scan.  9:45PM CT scan with no metastasis to liver, bilateral adrenal glands and retroperitoneal lymph nodes.  Concern for cord compression due to spinal metastasis unable to be seen on CT scan.  The patient was discussed with and seen by Dr. Zenia Resides who agrees with the treatment plan.  12:14 AM Unable to obtain MRI of the spine tonight.  Pt pain is controlled and he will be d/c home with instructions to return if symptoms of cauda equina develop. He is to followup with his oncologist this week for further evaluation and discussion of a spinal MRI.    BP 132/83  Pulse 77  Temp(Src) 97.9 F (36.6 C) (Oral)  Resp 18  SpO2 97%     ICD-9-CM ICD-10-CM  1. Bilateral low back pain without sciatica 724.2 M54.5  2. 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

## 2013-12-10 NOTE — ED Provider Notes (Signed)
Medical screening examination/treatment/procedure(s) were performed by non-physician practitioner and as supervising physician I was immediately available for consultation/collaboration.  Leota Jacobsen, MD 12/10/13 [phone]

## 2013-12-11 MED ORDER — OXYCODONE HCL 5 MG PO TABS: 5.0000 mg | ORAL_TABLET | ORAL | Status: DC | PRN

## 2013-12-11 MED ORDER — OXYCODONE-ACETAMINOPHEN 10-325 MG PO TABS: 1.0000 | ORAL_TABLET | ORAL | Status: DC | PRN

## 2013-12-11 NOTE — Discharge Instructions (Signed)
1. Medications: percocet and roxicodine, usual home medications 2. Treatment: rest, drink plenty of fluids,  3. Follow Up: Please followup with your oncologist for further evaluation and discussion of today's findings.

## 2013-12-12 ENCOUNTER — Telehealth: Payer: Self-pay | Admitting: Radiation Therapy

## 2013-12-12 NOTE — Telephone Encounter (Signed)
[name] called to let us know that he went to the ED on 9/30 c/o low back and abdominal pain. His Lt Leg is also very weak. He was given pain medication that makes him sleepy, but when he wakes up the pain is just as strong and the weakness in his leg a problem. He was advised  By the ED physician to follow-up with his Medical oncologist.           I reached out to [facility] the thoracic navigator. She had already gone for the day, but replied that she will work on getting him in first thing Monday morning. I called to let Christorpher know and instructed him to go to the ED if his symptoms get worse over the weekend.   Terrance Clark

## 2013-12-15 ENCOUNTER — Telehealth: Payer: Self-pay | Admitting: *Deleted

## 2013-12-15 NOTE — Telephone Encounter (Signed)
Called and spoke with patient's mother.  She stated she would call him and let him know I was trying to reach patient.

## 2013-12-15 NOTE — Telephone Encounter (Signed)
Called patient left vm message to call me.  I left my name and phone number

## 2013-12-15 NOTE — Telephone Encounter (Signed)
Called patient.  He stated he was having back pain.  I explained to him per Dr. Worthy Flank request I have an appt to see another medical oncologist due to several missed appointments with Dr. Julien Nordmann.  He stated this was to long to wait.  I stated I would let our scheduling department know and put him on a first appt if there was a cancellation.  I tried to talk to patient about going to ED for evaluation.  He hung the phone up on me.  I tried to call back with no answer.  I updated Dr. Julien Nordmann.

## 2013-12-15 NOTE — ED Provider Notes (Signed)
Medical screening examination/treatment/procedure(s) were performed by non-physician practitioner and as supervising physician I was immediately available for consultation/collaboration.  Leota Jacobsen, MD 12/15/13 [phone]

## 2013-12-16 ENCOUNTER — Other Ambulatory Visit: Payer: Self-pay | Admitting: Radiation Therapy

## 2013-12-16 DIAGNOSIS — C7949 Secondary malignant neoplasm of other parts of nervous system: Principal | ICD-10-CM

## 2013-12-16 DIAGNOSIS — C7931 Secondary malignant neoplasm of brain: Secondary | ICD-10-CM

## 2013-12-17 ENCOUNTER — Ambulatory Visit
Admission: RE | Admit: 2013-12-17 | Discharge: 2013-12-17 | Disposition: A | Discharge status: Home / Self Care | Payer: 59 | Source: Ambulatory Visit | Attending: Radiation Oncology | Admitting: Radiation Oncology

## 2013-12-17 DIAGNOSIS — C7931 Secondary malignant neoplasm of brain: Secondary | ICD-10-CM

## 2013-12-17 DIAGNOSIS — C7949 Secondary malignant neoplasm of other parts of nervous system: Principal | ICD-10-CM

## 2013-12-17 MED ORDER — GADOBENATE DIMEGLUMINE 529 MG/ML IV SOLN
11.0000 mL | Freq: Once | INTRAVENOUS | Status: AC | PRN
  Administered 2013-12-17: 11 mL via INTRAVENOUS

## 2013-12-18 ENCOUNTER — Encounter: Payer: Self-pay | Admitting: *Deleted

## 2013-12-18 ENCOUNTER — Ambulatory Visit
Admission: RE | Admit: 2013-12-18 | Discharge: 2013-12-18 | Disposition: A | Discharge status: Home / Self Care | Payer: 59 | Source: Ambulatory Visit | Attending: Radiation Oncology | Admitting: Radiation Oncology

## 2013-12-18 DIAGNOSIS — Z720 Tobacco use: Secondary | ICD-10-CM | POA: Insufficient documentation

## 2013-12-18 DIAGNOSIS — C3490 Malignant neoplasm of unspecified part of unspecified bronchus or lung: Secondary | ICD-10-CM | POA: Diagnosis not present

## 2013-12-18 DIAGNOSIS — M549 Dorsalgia, unspecified: Secondary | ICD-10-CM | POA: Diagnosis not present

## 2013-12-18 DIAGNOSIS — C7951 Secondary malignant neoplasm of bone: Secondary | ICD-10-CM | POA: Insufficient documentation

## 2013-12-18 DIAGNOSIS — Z510 Encounter for antineoplastic radiation therapy: Secondary | ICD-10-CM | POA: Diagnosis not present

## 2013-12-18 DIAGNOSIS — C7931 Secondary malignant neoplasm of brain: Secondary | ICD-10-CM

## 2013-12-18 DIAGNOSIS — C7949 Secondary malignant neoplasm of other parts of nervous system: Secondary | ICD-10-CM

## 2013-12-18 DIAGNOSIS — R52 Pain, unspecified: Secondary | ICD-10-CM | POA: Diagnosis not present

## 2013-12-18 MED ORDER — DEXAMETHASONE 4 MG PO TABS: 2.0000 mg | ORAL_TABLET | Freq: Two times a day (BID) | ORAL | Status: DC

## 2013-12-18 NOTE — Addendum Note (Signed)
Encounter addended by: Lora Paula, MD on: 12/18/2013 10:03 AM<BR>     Documentation filed: Medications, Orders

## 2013-12-18 NOTE — [facility] Oncology Navigator Note (Unsigned)
Saw patient today at [facility] appt.  He has just spoken with Dr. Tammi Klippel regarding disease progression.  Patient was tearful and we talked about plan of care.  I updated patient on his appt with Dr. Hyman Bible.  I also told him I would call Social Worker to come and speak to patient.    I called Polo Riley and updated her on patient status.  She stated she would see him today.

## 2013-12-18 NOTE — Progress Notes (Signed)
  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  Identity was confirmed.  All relevant records and images related to the planned course of therapy were reviewed.  The patient freely provided informed written consent to proceed with treatment after reviewing the details related to the planned course of therapy. The consent form was witnessed and verified by the simulation staff.  Then, the patient was set-up in a stable reproducible  supine position for radiation therapy.  CT images were obtained.  Surface markings were placed.  The CT images were loaded into the planning software.  Then the target and avoidance structures were contoured.  Treatment planning then occurred.  The radiation prescription was entered and confirmed.  Then, I designed and supervised the construction of a total of 3 medically necessary complex treatment devices including a body fix immobilization device and 2 multileaf collimator aperture it.  I have requested : Isodose Plan.  I have ordered:Nutrition Consult  PLAN:  The patient will receive 30 Gy in 10 fractions.  ________________________________  Sheral Apley Tammi Klippel, M.D.

## 2013-12-19 ENCOUNTER — Other Ambulatory Visit: Payer: 59

## 2013-12-19 ENCOUNTER — Other Ambulatory Visit: Payer: Self-pay | Admitting: Radiation Oncology

## 2013-12-19 ENCOUNTER — Telehealth: Payer: Self-pay | Admitting: Radiation Oncology

## 2013-12-19 DIAGNOSIS — C7931 Secondary malignant neoplasm of brain: Secondary | ICD-10-CM

## 2013-12-19 DIAGNOSIS — C3490 Malignant neoplasm of unspecified part of unspecified bronchus or lung: Secondary | ICD-10-CM

## 2013-12-19 DIAGNOSIS — Z510 Encounter for antineoplastic radiation therapy: Secondary | ICD-10-CM | POA: Diagnosis not present

## 2013-12-19 DIAGNOSIS — C7949 Secondary malignant neoplasm of other parts of nervous system: Principal | ICD-10-CM

## 2013-12-19 MED ORDER — OXYCODONE HCL ER 30 MG PO T12A: 40.0000 mg | EXTENDED_RELEASE_TABLET | Freq: Two times a day (BID) | ORAL | Status: DC

## 2013-12-19 MED ORDER — OXYCODONE HCL 5 MG PO TABS: 5.0000 mg | ORAL_TABLET | ORAL | Status: DC | PRN

## 2013-12-19 NOTE — Telephone Encounter (Signed)
Met patient just outside [facility] lobby. Patient picked up and signed for pain scripts (2).

## 2013-12-22 ENCOUNTER — Ambulatory Visit: Payer: 59

## 2013-12-22 ENCOUNTER — Encounter (HOSPITAL_COMMUNITY): Payer: Self-pay | Admitting: Emergency Medicine

## 2013-12-22 ENCOUNTER — Emergency Department (HOSPITAL_COMMUNITY)
Admission: EM | Admit: 2013-12-22 | Discharge: 2013-12-22 | Disposition: A | Discharge status: Home / Self Care | Payer: 59 | Attending: Emergency Medicine | Admitting: Emergency Medicine

## 2013-12-22 ENCOUNTER — Ambulatory Visit: Payer: 59 | Admitting: Radiation Oncology

## 2013-12-22 ENCOUNTER — Emergency Department (HOSPITAL_COMMUNITY): Payer: 59

## 2013-12-22 ENCOUNTER — Ambulatory Visit: Admission: RE | Admit: 2013-12-22 | Payer: 59 | Source: Ambulatory Visit

## 2013-12-22 ENCOUNTER — Other Ambulatory Visit: Payer: Self-pay | Admitting: Radiation Oncology

## 2013-12-22 DIAGNOSIS — Z85841 Personal history of malignant neoplasm of brain: Secondary | ICD-10-CM | POA: Insufficient documentation

## 2013-12-22 DIAGNOSIS — C7951 Secondary malignant neoplasm of bone: Secondary | ICD-10-CM

## 2013-12-22 DIAGNOSIS — M545 Low back pain, unspecified: Secondary | ICD-10-CM

## 2013-12-22 DIAGNOSIS — Z720 Tobacco use: Secondary | ICD-10-CM | POA: Diagnosis not present

## 2013-12-22 DIAGNOSIS — R100 Acute abdomen: Secondary | ICD-10-CM | POA: Insufficient documentation

## 2013-12-22 DIAGNOSIS — Z79899 Other long term (current) drug therapy: Secondary | ICD-10-CM | POA: Insufficient documentation

## 2013-12-22 DIAGNOSIS — Z85858 Personal history of malignant neoplasm of other endocrine glands: Secondary | ICD-10-CM | POA: Insufficient documentation

## 2013-12-22 DIAGNOSIS — Z7952 Long term (current) use of systemic steroids: Secondary | ICD-10-CM | POA: Insufficient documentation

## 2013-12-22 DIAGNOSIS — Z85118 Personal history of other malignant neoplasm of bronchus and lung: Secondary | ICD-10-CM | POA: Diagnosis not present

## 2013-12-22 DIAGNOSIS — Z923 Personal history of irradiation: Secondary | ICD-10-CM | POA: Insufficient documentation

## 2013-12-22 DIAGNOSIS — G8929 Other chronic pain: Secondary | ICD-10-CM | POA: Insufficient documentation

## 2013-12-22 DIAGNOSIS — M549 Dorsalgia, unspecified: Secondary | ICD-10-CM | POA: Diagnosis present

## 2013-12-22 MED ORDER — MORPHINE SULFATE ER 30 MG PO TBCR: 30.0000 mg | EXTENDED_RELEASE_TABLET | Freq: Two times a day (BID) | ORAL | Status: DC

## 2013-12-22 MED ORDER — ONDANSETRON HCL 4 MG/2ML IJ SOLN
4.0000 mg | Freq: Once | INTRAMUSCULAR | Status: AC
  Administered 2013-12-22: 4 mg via INTRAVENOUS
  Filled 2013-12-22: qty 2

## 2013-12-22 MED ORDER — LORAZEPAM 2 MG/ML IJ SOLN
1.0000 mg | Freq: Once | INTRAMUSCULAR | Status: AC
  Administered 2013-12-22: 1 mg via INTRAVENOUS
  Filled 2013-12-22: qty 1

## 2013-12-22 MED ORDER — DOCUSATE SODIUM 100 MG PO CAPS: 100.0000 mg | ORAL_CAPSULE | Freq: Two times a day (BID) | ORAL | Status: DC

## 2013-12-22 MED ORDER — HYDROMORPHONE HCL 1 MG/ML IJ SOLN
1.0000 mg | Freq: Once | INTRAMUSCULAR | Status: AC
  Administered 2013-12-22: 1 mg via INTRAVENOUS
  Filled 2013-12-22: qty 1

## 2013-12-22 MED ORDER — SODIUM CHLORIDE 0.9 % IV SOLN
INTRAVENOUS | Status: DC
  Administered 2013-12-22: 02:00:00 via INTRAVENOUS

## 2013-12-22 MED ORDER — OXYCODONE-ACETAMINOPHEN 5-325 MG PO TABS
2.0000 | ORAL_TABLET | Freq: Once | ORAL | Status: AC
  Administered 2013-12-22: 2 via ORAL
  Filled 2013-12-22: qty 2

## 2013-12-22 MED ORDER — SODIUM CHLORIDE 0.9 % IV BOLUS (SEPSIS)
1000.0000 mL | Freq: Once | INTRAVENOUS | Status: AC
  Administered 2013-12-22: 1000 mL via INTRAVENOUS

## 2013-12-22 NOTE — ED Notes (Signed)
Patient is alert and oriented x3.  He was given DC instructions and follow up visit instructions.  Patient gave verbal understanding.  He was DC ambulatory under his own power to home.  V/S stable.  He was not showing any signs of distress on DC 

## 2013-12-22 NOTE — ED Provider Notes (Signed)
CSN: [id number]     Arrival date & time 12/22/13  0037 History   First MD Initiated Contact with Patient 12/22/13 0106     Chief Complaint  Patient presents with  . Back Pain  . Abdominal Pain     (Consider location/radiation/quality/duration/timing/severity/associated sxs/prior Treatment) HPI Comments: Patient here complaining of worsening chronic back pain. 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 He is scheduled for radiation treatment next week. I have seen him for similar symptoms in the past. Denies any bowel or bladder dysfunction. No fever or chills. Does complain of constipation and crampy abdominal pain. The pain did cause him to have emesis x2 today which was nonbilious. He has passed some hard stools. Patient has been taking over-the-counter stool softeners without relief. Has any recent history of back trauma. Denies any numbness or tingling to his legs.  Patient is a 38 y.o. male presenting with back pain and abdominal pain. The history is provided by the patient and a significant other.  Back Pain Associated symptoms: abdominal pain   Abdominal Pain   Past Medical History  Diagnosis Date  . H/O oral surgery 04/2012  . Cough   . Shortness of breath     with exertion  . Insomnia     d/t pain and takes Goody's PM  . Hx of radiation therapy 07/16/12- 09/10/12    lower neck and upper chest region , 63 gray in 35 fx  . Hx of radiation therapy 07/18/12    SRS to brain metastasis  . Lung cancer 06/2012    RUL lung  . 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 Cancer     adrenal mets   Past Surgical History  Procedure Laterality Date  . Foot surgery    . Mouth surgery    . Video bronchoscopy with endobronchial ultrasound Right 06/19/2012    Procedure: VIDEO BRONCHOSCOPY WITH ENDOBRONCHIAL ULTRASOUND;  Surgeon: Collene Gobble, MD;  Location: [employer];  Service: Pulmonary;  Laterality: Right;   Family History  Problem Relation Age of  Onset  . Cancer Maternal Aunt     unknown  . Cancer Mother     breast ca 2007, surgery right breast,chemotherapy,no rad txx  . Cancer Maternal Grandfather     colon   History  Substance Use Topics  . Smoking status: Current Some Day Smoker -- 1.00 packs/day for 22 years    Types: Cigarettes  . Smokeless tobacco: Never Used     Comment: 2 cigs since 06/03/12///ldc  . Alcohol Use: No    Review of Systems  Gastrointestinal: Positive for abdominal pain.  Musculoskeletal: Positive for back pain.  All other systems reviewed and are negative.     Allergies  Review of patient's allergies indicates no known allergies.  Home Medications   Prior to Admission medications   Medication Sig Start Date End Date Taking? Authorizing Provider  cyclobenzaprine (FLEXERIL) 10 MG tablet Take 1 tablet (10 mg total) by mouth 2 (two) times daily as needed for muscle spasms. 11/29/13   Jasper Riling. Pickering, MD  dexamethasone (DECADRON) 4 MG tablet Take 0.5 tablets (2 mg total) by mouth 2 (two) times daily. 12/18/13   Lora Paula, MD  oxyCODONE (OXY IR/ROXICODONE) 5 MG immediate release tablet Take 1-3 tablets (5-15 mg total) by mouth every 4 (four) hours as needed for severe pain. 12/19/13   Lora Paula, MD  oxyCODONE (ROXICODONE) 5  MG immediate release tablet Take 1-2 tablets (5-10 mg total) by mouth every 4 (four) hours as needed for breakthrough pain. 12/11/13   Hannah Muthersbaugh, PA-C  OxyCODONE 30 MG T12A Take 40 mg by mouth every 12 (twelve) hours. 12/19/13   Lora Paula, MD  oxyCODONE-acetaminophen (PERCOCET) 10-325 MG per tablet Take 1 tablet by mouth every 4 (four) hours as needed for pain. 12/11/13   Hannah Muthersbaugh, PA-C  polyethylene glycol (MIRALAX / GLYCOLAX) packet Take 17 g by mouth daily. 11/29/13   Jasper Riling. Pickering, MD   BP 135/85  Pulse 98  Temp(Src) 98 F (36.7 C) (Oral)  Resp 18  Wt 128 lb (58.06 kg)  SpO2 99% Physical Exam  Nursing note and vitals  reviewed. Constitutional: He is oriented to person, place, and time. He appears well-developed and well-nourished.  Non-toxic appearance. No distress.  HENT:  Head: Normocephalic and atraumatic.  Eyes: Conjunctivae, EOM and lids are normal. Pupils are equal, round, and reactive to light.  Neck: Normal range of motion. Neck supple. No tracheal deviation present. No mass present.  Cardiovascular: Normal rate, regular rhythm and normal heart sounds.  Exam reveals no gallop.   No murmur heard. Pulmonary/Chest: Effort normal and breath sounds normal. No stridor. No respiratory distress. He has no decreased breath sounds. He has no wheezes. He has no rhonchi. He has no rales.  Abdominal: Soft. Normal appearance and bowel sounds are normal. He exhibits no distension. There is generalized tenderness. There is no rigidity, no rebound, no guarding and no CVA tenderness.  Musculoskeletal: Normal range of motion. He exhibits no edema and no tenderness.       Lumbar back: He exhibits pain and spasm.       Back:  Neurological: He is alert and oriented to person, place, and time. He has normal strength. No cranial nerve deficit or sensory deficit. GCS eye subscore is 4. GCS verbal subscore is 5. GCS motor subscore is 6.  Skin: Skin is warm and dry. No abrasion and no rash noted.  Psychiatric: He has a normal mood and affect. His speech is normal and behavior is normal.    ED Course  Procedures (including critical care time) Labs Review Labs Reviewed - No data to display  Imaging Review No results found.   EKG Interpretation None      MDM   Final diagnoses:  None    Patient given pain medication and IV fluids here and feels better. Abdominal x-ray without evidence of bowel obstruction. Will prescribe Colace and patient to followup at the cancer center tomorrow    Leota Jacobsen, MD 12/22/13 [phone]

## 2013-12-22 NOTE — ED Notes (Signed)
Pt states that he has had abd pain and back pain for 5-6 weeks; pt states that he has been recently seen for the same thing; pt states that he was told he has Cancer to spine and lymph nodes and that it what is causing the pain; pt states that he is just unable to get any relief

## 2013-12-22 NOTE — ED Notes (Signed)
Upon going to discharge pt ,  He and his wife at bedside states he is in severe pain and wants more pain medication, Dr Zenia Resides aware,

## 2013-12-22 NOTE — Discharge Instructions (Signed)
Followup in the cancer center tomorrow to have your pain medications adjusted

## 2013-12-24 ENCOUNTER — Inpatient Hospital Stay: Admission: RE | Admit: 2013-12-24 | Payer: 59 | Source: Ambulatory Visit

## 2013-12-24 ENCOUNTER — Ambulatory Visit: Payer: 59 | Admitting: Radiation Oncology

## 2013-12-25 ENCOUNTER — Ambulatory Visit
Admission: RE | Admit: 2013-12-25 | Discharge: 2013-12-25 | Disposition: A | Discharge status: Home / Self Care | Payer: 59 | Source: Ambulatory Visit | Attending: Radiation Oncology | Admitting: Radiation Oncology

## 2013-12-25 ENCOUNTER — Ambulatory Visit: Payer: 59

## 2013-12-25 DIAGNOSIS — C7951 Secondary malignant neoplasm of bone: Secondary | ICD-10-CM

## 2013-12-25 NOTE — Progress Notes (Signed)
  Radiation Oncology         [phone] ________________________________  Name: Terrance Clark  MRN: [id number]  Date: 12/25/2013  DOB: January 31, 1976  No show

## 2013-12-26 ENCOUNTER — Ambulatory Visit: Admission: RE | Admit: 2013-12-26 | Payer: 59 | Source: Ambulatory Visit | Admitting: Radiation Oncology

## 2013-12-26 ENCOUNTER — Ambulatory Visit: Payer: 59 | Admitting: Radiation Oncology

## 2013-12-26 ENCOUNTER — Ambulatory Visit: Payer: 59

## 2013-12-29 ENCOUNTER — Ambulatory Visit: Payer: 59

## 2013-12-29 ENCOUNTER — Other Ambulatory Visit: Payer: 59

## 2013-12-29 ENCOUNTER — Ambulatory Visit: Payer: 59 | Admitting: Radiation Oncology

## 2013-12-30 ENCOUNTER — Ambulatory Visit: Payer: 59 | Admitting: Radiation Oncology

## 2013-12-30 ENCOUNTER — Ambulatory Visit: Payer: 59

## 2013-12-31 ENCOUNTER — Ambulatory Visit: Payer: 59 | Admitting: Radiation Oncology

## 2013-12-31 ENCOUNTER — Ambulatory Visit: Payer: 59

## 2014-01-01 ENCOUNTER — Ambulatory Visit: Payer: 59

## 2014-01-01 ENCOUNTER — Telehealth: Payer: Self-pay | Admitting: Radiation Oncology

## 2014-01-01 ENCOUNTER — Ambulatory Visit: Payer: 59 | Admitting: Radiation Oncology

## 2014-01-01 NOTE — Telephone Encounter (Signed)
Patient has not shown for port and treat appointments. Phoned patient to inquire. Patient reports that he is in severe pain and physically unable to present for treatment. Patient reports taking MS Contin 30 mg every 12 hours and oxy ir 15 mg every four hours. Patient states, "those medications don't do nothing for my pain." Also, patient reports taking decadron 2 mg once per day. Patient reports that he has not had a bowel movement in seven day. He explains his stomach is hard, swollen, and painful. Reports everything he eats "comes back up." Reports hesitancy with urination. Reports for constipation he "has tried everything" to include ex lax, castroil, and fleets enema without success. Patient states, "I got Lauren's messages but, I can get the energy to get there and sign the paperwork." Questioned if patient is considering going to [facility] in [place]. Patient explains he is approved and they plan to fly him to [place] November 2. Patient states, "everytime I get treatment the disease spread so maybe I need to try something different." This nurse explained that the patients disease is very aggressive and that spinal cord damage could occur if his spine mets are left untreated. Patient states, "I hurt so bad sometime I just want to give up." Patient denies suicidal thoughts or plan. Will reach out to support staff for suggestions and call patient back.

## 2014-01-02 ENCOUNTER — Ambulatory Visit: Payer: 59 | Admitting: Radiation Oncology

## 2014-01-02 ENCOUNTER — Ambulatory Visit: Payer: 59

## 2014-01-05 ENCOUNTER — Ambulatory Visit: Payer: 59 | Admitting: Radiation Oncology

## 2014-01-05 ENCOUNTER — Ambulatory Visit: Payer: 59

## 2014-01-06 ENCOUNTER — Ambulatory Visit: Payer: 59

## 2014-01-07 ENCOUNTER — Ambulatory Visit: Payer: 59

## 2014-01-07 ENCOUNTER — Ambulatory Visit: Payer: 59 | Admitting: Radiation Oncology

## 2014-01-08 ENCOUNTER — Telehealth: Payer: Self-pay | Admitting: *Deleted

## 2014-01-08 ENCOUNTER — Ambulatory Visit: Payer: 59

## 2014-01-08 NOTE — Telephone Encounter (Signed)
On 01-08-14 [facility] to van[facility] it was all of Dr. Tammi Klippel notes, tx planning

## 2014-01-09 ENCOUNTER — Ambulatory Visit: Payer: 59 | Admitting: Radiation Oncology

## 2014-01-09 ENCOUNTER — Ambulatory Visit: Payer: 59

## 2014-01-12 ENCOUNTER — Telehealth: Payer: Self-pay | Admitting: *Deleted

## 2014-01-12 ENCOUNTER — Encounter: Payer: Self-pay | Admitting: Radiation Oncology

## 2014-01-12 ENCOUNTER — Ambulatory Visit: Payer: 59

## 2014-01-12 NOTE — Telephone Encounter (Signed)
Received call from [place] at [facility].  Pt has stated he has been receiving treatment weekly.  The records Vanetta received state that his last chemo was 06/25/13.  Informed her that patient was last seen by Dr Julien Nordmann 07/16/13 but due to noncompliance, pt was reassigned to Dr Lona Kettle to be seen on 12/29/13, but he did not show for the appointment.  Last chemo was 06/25/13.  Pt has been seeing Dr. Tammi Klippel and getting radiation therapy.  Vanetta requested for this information to be in writing so she can discuss further with the patient.  Telephone note faxed to [phone].

## 2014-01-12 NOTE — Progress Notes (Signed)
  Radiation Oncology         [phone] ________________________________  Name: Terrance Clark  MRN: [id number]  Date: 01/12/2014  DOB: April 25, 1975  Chart Note:  This patient was set up to receive palliative radiotherapy to the spine from T9 to L5 inclusive due to pain and prevention of cord compression.  However, following simulation, he never returned and did not respond to efforts to reach him.  We did receive a records request from [facility], but, were never able to confirm that he went there for 2nd opinion.  We remain willing to help this patient if he desires our support.  At this time, we have exhausted all efforts to deliver his planned treatment and will close out his radiation record.  ________________________________  Sheral Apley. Tammi Klippel, M.D.

## 2014-01-13 ENCOUNTER — Ambulatory Visit: Payer: 59 | Admitting: Radiation Oncology

## 2014-01-13 ENCOUNTER — Ambulatory Visit: Payer: 59

## 2014-01-14 ENCOUNTER — Ambulatory Visit: Payer: 59

## 2014-01-15 ENCOUNTER — Ambulatory Visit: Payer: 59

## 2014-01-16 ENCOUNTER — Ambulatory Visit: Payer: 59

## 2014-01-23 ENCOUNTER — Ambulatory Visit: Payer: 59 | Admitting: Radiation Oncology

## 2014-03-08 ENCOUNTER — Emergency Department (HOSPITAL_COMMUNITY): Payer: 59

## 2014-03-08 ENCOUNTER — Inpatient Hospital Stay (HOSPITAL_COMMUNITY)
Admission: EM | Admit: 2014-03-08 | Discharge: 2014-03-09 | DRG: 101 | Discharge status: Against Medical Advice | Payer: 59 | Attending: Internal Medicine | Admitting: Internal Medicine

## 2014-03-08 ENCOUNTER — Encounter (HOSPITAL_COMMUNITY): Payer: Self-pay | Admitting: Emergency Medicine

## 2014-03-08 ENCOUNTER — Emergency Department (HOSPITAL_COMMUNITY): Payer: 59 | Attending: Emergency Medicine

## 2014-03-08 DIAGNOSIS — Z923 Personal history of irradiation: Secondary | ICD-10-CM | POA: Diagnosis not present

## 2014-03-08 DIAGNOSIS — Z7901 Long term (current) use of anticoagulants: Secondary | ICD-10-CM | POA: Diagnosis not present

## 2014-03-08 DIAGNOSIS — F17210 Nicotine dependence, cigarettes, uncomplicated: Secondary | ICD-10-CM | POA: Diagnosis present

## 2014-03-08 DIAGNOSIS — C7970 Secondary malignant neoplasm of unspecified adrenal gland: Secondary | ICD-10-CM | POA: Diagnosis present

## 2014-03-08 DIAGNOSIS — R569 Unspecified convulsions: Secondary | ICD-10-CM

## 2014-03-08 DIAGNOSIS — G40209 Localization-related (focal) (partial) symptomatic epilepsy and epileptic syndromes with complex partial seizures, not intractable, without status epilepticus: Secondary | ICD-10-CM | POA: Diagnosis not present

## 2014-03-08 DIAGNOSIS — Z79899 Other long term (current) drug therapy: Secondary | ICD-10-CM

## 2014-03-08 DIAGNOSIS — C7931 Secondary malignant neoplasm of brain: Secondary | ICD-10-CM | POA: Diagnosis present

## 2014-03-08 DIAGNOSIS — Z86711 Personal history of pulmonary embolism: Secondary | ICD-10-CM

## 2014-03-08 DIAGNOSIS — J910 Malignant pleural effusion: Secondary | ICD-10-CM | POA: Diagnosis present

## 2014-03-08 DIAGNOSIS — C3490 Malignant neoplasm of unspecified part of unspecified bronchus or lung: Secondary | ICD-10-CM | POA: Diagnosis present

## 2014-03-08 DIAGNOSIS — Z85118 Personal history of other malignant neoplasm of bronchus and lung: Secondary | ICD-10-CM

## 2014-03-08 DIAGNOSIS — I2699 Other pulmonary embolism without acute cor pulmonale: Secondary | ICD-10-CM | POA: Diagnosis present

## 2014-03-08 HISTORY — DX: Personal history of other venous thrombosis and embolism: Z86.718

## 2014-03-08 LAB — URINALYSIS, ROUTINE W REFLEX MICROSCOPIC
BILIRUBIN URINE: NEGATIVE
Glucose, UA: NEGATIVE mg/dL
Hgb urine dipstick: NEGATIVE
Ketones, ur: NEGATIVE mg/dL
Leukocytes, UA: NEGATIVE
NITRITE: NEGATIVE
PROTEIN: NEGATIVE mg/dL
SPECIFIC GRAVITY, URINE: 1.019 (ref 1.005–1.030)
UROBILINOGEN UA: 2 mg/dL — AB (ref 0.0–1.0)
pH: 8 (ref 5.0–8.0)

## 2014-03-08 LAB — RAPID URINE DRUG SCREEN, HOSP PERFORMED
Amphetamines: NOT DETECTED
BARBITURATES: NOT DETECTED
BENZODIAZEPINES: POSITIVE — AB
Cocaine: NOT DETECTED
Opiates: NOT DETECTED
TETRAHYDROCANNABINOL: POSITIVE — AB

## 2014-03-08 LAB — BASIC METABOLIC PANEL
Anion gap: 9 (ref 5–15)
BUN: 8 mg/dL (ref 6–23)
CO2: 24 mmol/L (ref 19–32)
CREATININE: 0.61 mg/dL (ref 0.50–1.35)
Calcium: 8.7 mg/dL (ref 8.4–10.5)
Chloride: 103 mEq/L (ref 96–112)
GFR calc Af Amer: >90 mL/min (ref >90)
Glucose, Bld: 75 mg/dL (ref 70–99)
Potassium: 4 mmol/L (ref 3.5–5.1)
Sodium: 136 mmol/L (ref 135–145)

## 2014-03-08 LAB — CBC
HEMATOCRIT: 37 % — AB (ref 39.0–52.0)
Hemoglobin: 11.9 g/dL — ABNORMAL LOW (ref 13.0–17.0)
MCH: 27.4 pg (ref 26.0–34.0)
MCHC: 32.2 g/dL (ref 30.0–36.0)
MCV: 85.1 fL (ref 78.0–100.0)
Platelets: 234 10*3/uL (ref 150–400)
RBC: 4.35 MIL/uL (ref 4.22–5.81)
RDW: 15.1 % (ref 11.5–15.5)
WBC: 4.8 10*3/uL (ref 4.0–10.5)

## 2014-03-08 LAB — ETHANOL

## 2014-03-08 MED ORDER — DEXAMETHASONE SODIUM PHOSPHATE 10 MG/ML IJ SOLN
10.0000 mg | Freq: Once | INTRAMUSCULAR | Status: AC
  Administered 2014-03-08: 10 mg via INTRAVENOUS
  Filled 2014-03-08: qty 1

## 2014-03-08 MED ORDER — SODIUM CHLORIDE 0.9 % IV SOLN
1000.0000 mL | Freq: Once | INTRAVENOUS | Status: AC
  Administered 2014-03-08: 1000 mL via INTRAVENOUS

## 2014-03-08 MED ORDER — SODIUM CHLORIDE 0.9 % IV SOLN: 1000.0000 mL | INTRAVENOUS | Status: DC

## 2014-03-08 MED ORDER — LORAZEPAM 2 MG/ML IJ SOLN
1.0000 mg | Freq: Once | INTRAMUSCULAR | Status: AC
  Administered 2014-03-08: 1 mg via INTRAVENOUS
  Filled 2014-03-08: qty 1

## 2014-03-08 MED ORDER — LEVETIRACETAM IN NACL 1000 MG/100ML IV SOLN
1000.0000 mg | Freq: Once | INTRAVENOUS | Status: AC
  Administered 2014-03-08: 1000 mg via INTRAVENOUS
  Filled 2014-03-08: qty 100

## 2014-03-08 NOTE — Consult Note (Addendum)
Consult Reason for Consult:seizures Referring Physician: Dr Tomi Bamberger  CC: seizures  HPI: Terrance Clark is an 38 y.o. male history of cancer with metastases to the brain according to family. He is getting treatment at the cancer center in [place]. The patient recently completed 14 rounds of chemotherapy and now presents with questionable seizure activity. Patient started having episode this afternoon where his leg started jerking uncontrollably. It then started affecting his left arm. Patient was unable to stop this. He was alert and awake the whole time. Family started to notice that he was having some facial twitching as well. EMS arrived and he was given a diazepam. The symptoms improved although he has continued to have some twitching in the left side of his face. Patient has had poor oral intake. He has been coughing. He does have some nausea. Denies any vomiting or diarrhea. No fevers or coughing. He has never had seizures before.  He had a head CT completed (imaging reviewed) shows a likely hemorrhagic met in the right parietal lobe. Family states he was on a blood thinner at home for known history of blood clots.   Past Medical History  Diagnosis Date  . H/O oral surgery 04/2012  . Cough   . Shortness of breath     with exertion  . Insomnia     d/t pain and takes Goody's PM  . Hx of radiation therapy 07/16/12- 09/10/12    lower neck and upper chest region , 63 gray in 35 fx  . Hx of radiation therapy 07/18/12    SRS to brain metastasis  . Lung cancer 06/2012    RUL lung  . 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 Cancer     adrenal mets  . History of blood clots   . Lung cancer     Past Surgical History  Procedure Laterality Date  . Foot surgery    . Mouth surgery    . Video bronchoscopy with endobronchial ultrasound Right 06/19/2012    Procedure: VIDEO BRONCHOSCOPY WITH ENDOBRONCHIAL ULTRASOUND;  Surgeon: Collene Gobble, MD;  Location: [facility];  Service: Pulmonary;  Laterality: Right;     Family History  Problem Relation Age of Onset  . Cancer Maternal Aunt     unknown  . Cancer Mother     breast ca 2007, surgery right breast,chemotherapy,no rad txx  . Cancer Maternal Grandfather     colon    Social History:  reports that he has been smoking Cigarettes.  He has a 22 pack-year smoking history. He has never used smokeless tobacco. He reports that he uses illicit drugs (Marijuana). He reports that he does not drink alcohol.  No Known Allergies  Medications: Scheduled:   ROS: Out of a complete 14 system review, the patient complains of only the following symptoms, and all other reviewed systems are negative. +fatigue  Physical Examination: Filed Vitals:   03/08/14 1912  BP: 110/84  Pulse: 100  Temp:   Resp: 22   Physical Exam  Constitutional: He appears well-developed and well-nourished.  Psych: Affect appropriate to situation Eyes: No scleral injection HENT: No OP obstrucion Head: Normocephalic.  Cardiovascular: Normal rate and regular rhythm.  Respiratory: Effort normal and breath sounds normal.  GI: Soft. Bowel sounds are normal. No distension. There is no tenderness.  Skin: WDI  Neurologic Examination Mental Status: Lethargic, easily aroused to voice but quickly falls back asleep. When asked name states his birth date. Oriented to hospital, knows his girlfriends name. Follows simple commands (  squeeze fingers) Cranial Nerves: II: funable to visualize fundi bilaterally, visual fields grossly normal, pupils equal, round, reactive to light III,IV, VI: ptosis not present, extra-ocular motions intact bilaterally V,VII: smile symmetric, facial light touch sensation normal bilaterally VIII: hearing normal bilaterally IX,X: cough reflex present XI: trapezius strength/neck flexion strength normal bilaterally XII: tongue strength normal  Motor: Due to mental status unable to formally assess strength but appears to move all extremities symmetrically  and against light resistnace Tone and bulk:normal tone throughout; no atrophy noted Sensory: Pinprick and light touch intact throughout, bilaterally Deep Tendon Reflexes: 2+ and symmetric throughout Plantars: Right: downgoing   Left:equivocal  Cerebellar: Unable to test due to mental status Gait: unable to test due to mental status  Laboratory Studies:   Basic Metabolic Panel: No results for input(s): NA, K, CL, CO2, GLUCOSE, BUN, CREATININE, CALCIUM, MG, PHOS in the last 168 hours.  Liver Function Tests: No results for input(s): AST, ALT, ALKPHOS, BILITOT, PROT, ALBUMIN in the last 168 hours. No results for input(s): LIPASE, AMYLASE in the last 168 hours. No results for input(s): AMMONIA in the last 168 hours.  CBC:  Recent Labs Lab 03/08/14 1840  WBC 4.8  HGB 11.9*  HCT 37.0*  MCV 85.1  PLT 234    Cardiac Enzymes: No results for input(s): CKTOTAL, CKMB, CKMBINDEX, TROPONINI in the last 168 hours.  BNP: Invalid input(s): POCBNP  CBG: No results for input(s): GLUCAP in the last 168 hours.  Microbiology: No results found for this or any previous visit.  Coagulation Studies: No results for input(s): LABPROT, INR in the last 72 hours.  Urinalysis: No results for input(s): COLORURINE, LABSPEC, PHURINE, GLUCOSEU, HGBUR, BILIRUBINUR, KETONESUR, PROTEINUR, UROBILINOGEN, NITRITE, LEUKOCYTESUR in the last 168 hours.  Invalid input(s): APPERANCEUR  Lipid Panel:  No results found for: CHOL, TRIG, HDL, CHOLHDL, VLDL, LDLCALC  HgbA1C: No results found for: HGBA1C  Urine Drug Screen:  No results found for: LABOPIA, COCAINSCRNUR, LABBENZ, AMPHETMU, THCU, LABBARB  Alcohol Level: No results for input(s): ETH in the last 168 hours.  Other results:  Imaging: Ct Head Wo Contrast   (if New Onset Seizure And/or Head Trauma)  03/08/2014   CLINICAL DATA:  Metastatic lung cancer, sudden onset of seizure today.  EXAM: CT HEAD WITHOUT CONTRAST  TECHNIQUE: Contiguous axial images  were obtained from the base of the skull through the vertex without intravenous contrast.  COMPARISON:  Brain MRI 08/15/2013  FINDINGS: No midline shift is noted. No destructive bony lesion. No acute fractures. Paranasal sinuses and mastoid air cells are unremarkable.  In axial image 22 there is high density probable hemorrhagic metastasis in right parietal lobe with surrounding vasogenic edema. The lesion measures about 1.2 cm. There is no evidence of active surrounding hemorrhage or intraventricular hemorrhage.  In axial image 28 there is serpiginous high density material in right parietal lobe high convexity with gyral disposition measures about 75 Hounsfield units in attenuation. 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 Further correlation with MRI is recommended.  IMPRESSION: In axial image 22 there is high density probable hemorrhagic metastasis in right parietal lobe with surrounding vasogenic edema. The lesion measures about 1.2 cm. There is no evidence of active surrounding hemorrhage or intraventricular hemorrhage.  In axial image 28 there is serpiginous high density material in right parietal lobe high convexity with gyral disposition measures about 75 Hounsfield units in attenuation. This most likely represents gyral or cortical  calcification at the site of previous radiation necrosis rather then petechial parenchymal hemorrhage. Further correlation with MRI is recommended.  These results were called by telephone at the time of interpretation on 03/08/2014 at 6:14 pm to Dr. Dorie Rank , who verbally acknowledged these results.   Electronically Signed   By: Lahoma Crocker M.D.   On: 03/08/2014 18:15   Dg Chest Port 1 View  03/08/2014   CLINICAL DATA:  Weakness and fever, history of lung carcinoma  EXAM: PORTABLE CHEST - 1 VIEW  COMPARISON:  12/22/2013  FINDINGS: Cardiac shadow is stable from the  prior exam. A new right-sided pleural effusion and likely right basilar infiltrate is noted. Slightly more prominent pulmonary nodules are noted bilaterally consistent with the patient's given clinical history.  IMPRESSION: Increase in the degree of pulmonary nodularity when compare with the prior exam.  New right basilar effusion with likely underlying infiltrate.   Electronically Signed   By: Inez Catalina M.D.   On: 03/08/2014 17:51     Assessment/Plan:  38y/p gentleman with history of cancer with known brain mets on blood thinner for blood clots presents with new onset partial motor seizure. Upon arrival to ED noted to have left sided twitching. Resolved after ativan and keppra load. CT head shows likely hemorrhagic metastasis. Currently lethargic but easily arousable.   -start keppra 797m BID -hold all anticoagulation -seizure precautions, ativan prn for breakthrough seizures -goal SBP <160 -MRI brain with and without contrast. Would repeat head CT in morning if MRI unable to be completed early tomorrow morning -[facility] may need radiation therapy going forward.    PJim Like DO [facility] [phone] If 7pm- 7am, please page neurology on call as listed in [place] 03/08/2014, 7:26 PM

## 2014-03-08 NOTE — H&P (Addendum)
PATIENT DETAILS Name: Terrance Clark Age: 38 y.o. Sex: male Date of Birth: July 07, 1975 Admit Date: 03/08/2014 PCP:No PCP Per Patient   CHIEF COMPLAINT:  Possible seizure-like activity  HPI: Terrance Clark is a 38 y.o. 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 Please note, patient currently is unable to provide history, he is able to open his eyes and follows some commands but appears lethargic and sleepy. Most of this history is obtained after reviewing ED chart, speaking with patient's brother Terrance Clark over the phone and also with patient's girlfriend Terrance Clark over the phone. Apparently sometime in the afternoon today, patient's left leg started jerking uncontrollably, this then gradually involved his left arm. Family members also noticed that he was having some left facial twitching. EMS was called, patient was given benzodiazepine for her with some of his symptoms improved, however he was still having some left facial twitching. He was subsequently brought to the emergency room, where a CT of the head showed a hemorrhagic metastases in the right parietal lobe. I was subsequently asked to admit this patient for further evaluation and treatment.  Per family-particularly patient's girlfriend-apparently patient was recently diagnosed with "blood clots" in his "upper part of his body" and started on Fondapurinix injection-last dose was 12/26 per family. Neurology was consulted by EDMD, current recommendations are to continue Keppra, and keep SBP less than 160 possibly.   ALLERGIES:  No Known Allergies  PAST MEDICAL HISTORY: Past Medical History  Diagnosis Date  . H/O oral surgery 04/2012  . Cough   . Shortness of breath     with exertion  . Insomnia       d/t pain and takes Goody's PM  . Hx of radiation therapy 07/16/12- 09/10/12    lower neck and upper chest region , 63 gray in 35 fx  . Hx of radiation therapy 07/18/12    SRS to brain metastasis  . Lung cancer 06/2012    RUL lung  . 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 Cancer     adrenal mets  . History of blood clots   . Lung cancer     PAST SURGICAL HISTORY: Past Surgical History  Procedure Laterality Date  . Foot surgery    . Mouth surgery    . Video bronchoscopy with endobronchial ultrasound Right 06/19/2012    Procedure: VIDEO BRONCHOSCOPY WITH ENDOBRONCHIAL ULTRASOUND;  Surgeon: Collene Gobble, MD;  Location: [facility];  Service: Pulmonary;  Laterality: Right;    MEDICATIONS AT HOME: Prior to Admission medications   Medication Sig Start Date End Date Taking? Authorizing Provider  albuterol (PROVENTIL HFA;VENTOLIN HFA) 108 (90 BASE) MCG/ACT inhaler Inhale 2 puffs into the lungs every 6 (six) hours as needed for wheezing or shortness of breath.   Yes Historical Provider, MD  fentaNYL (DURAGESIC - DOSED MCG/HR) 75 MCG/HR Place 150 mcg onto the skin every 3 (three) days.   Yes Historical Provider, MD  gabapentin (NEURONTIN) 300 MG capsule Take 300 mg by mouth every 8 (eight) hours.   Yes Historical Provider, MD  ondansetron (ZOFRAN-ODT) 4 MG disintegrating tablet Take 4 mg by mouth every 8 (eight) hours as needed for nausea or vomiting.   Yes Historical Provider, MD  pantoprazole (PROTONIX) 40 MG tablet Take 40 mg by mouth daily.  Yes Historical Provider, MD  PRESCRIPTION MEDICATION Take 3 tablets by mouth 3 (three) times daily. Symphytum Officinale 30C Homeopathic-- Dissolve under tongue. Rise mouth with water before taking.   Yes Historical Provider, MD  cyclobenzaprine (FLEXERIL) 10 MG tablet Take 1 tablet (10 mg total) by mouth 2 (two) times daily as needed for muscle spasms. Patient not taking: Reported on 03/08/2014 11/29/13   Jasper Riling. Pickering, MD  dexamethasone (DECADRON) 4 MG tablet  Take 0.5 tablets (2 mg total) by mouth 2 (two) times daily. Patient not taking: Reported on 03/08/2014 12/18/13   Lora Paula, MD  docusate sodium (COLACE) 100 MG capsule Take 1 capsule (100 mg total) by mouth every 12 (twelve) hours. Patient not taking: Reported on 03/08/2014 12/22/13   Leota Jacobsen, MD  morphine (MS CONTIN) 30 MG 12 hr tablet Take 1 tablet (30 mg total) by mouth every 12 (twelve) hours. Patient not taking: Reported on 03/08/2014 12/22/13   Lora Paula, MD  oxyCODONE (OXY IR/ROXICODONE) 5 MG immediate release tablet Take 1-3 tablets (5-15 mg total) by mouth every 4 (four) hours as needed for severe pain. Patient not taking: Reported on 03/08/2014 12/19/13   Lora Paula, MD  oxyCODONE-acetaminophen (PERCOCET) 10-325 MG per tablet Take 1 tablet by mouth every 4 (four) hours as needed for pain. Patient not taking: Reported on 03/08/2014 12/11/13   Jarrett Soho Muthersbaugh, PA-C    FAMILY HISTORY: Family History  Problem Relation Age of Onset  . Cancer Maternal Aunt     unknown  . Cancer Mother     breast ca 2007, surgery right breast,chemotherapy,no rad txx  . Cancer Maternal Grandfather     colon    SOCIAL HISTORY:  reports that he has been smoking Cigarettes.  He has a 22 pack-year smoking history. He has never used smokeless tobacco. He reports that he uses illicit drugs (Marijuana). He reports that he does not drink alcohol.  REVIEW OF SYSTEMS:  Unable to obtain-no family member at bedside   PHYSICAL EXAM: Blood pressure 110/84, pulse 100, temperature 98.7 F (37.1 C), temperature source Oral, resp. rate 22, SpO2 96 %.  General appearance: Easily arousable, however still lethargic. Grunts yes and no, but otherwise nonverbal at present.Spontaneously moving all 4 extremities. No twitching or jerking off any extremity evident at this time.  HEENT: Atraumatic and Normocephalic, pupils equally reactive to light and accomodation Neck: supple, no JVD. No  cervical lymphadenopathy.  Chest:Good air entry bilaterally, no added sounds  CVS: S1 S2 regular, no murmurs.  Abdomen: Bowel sounds present, Non tender and not distended with no gaurding, rigidity or rebound. Extremities: B/L Lower Ext shows no edema, both legs are warm to touch Neurology: Difficult exam-but moving all 4 extremities. Skin:No Rash Wounds:N/A  LABS ON ADMISSION:   Recent Labs  03/08/14 1840  NA 136  K 4.0  CL 103  CO2 24  GLUCOSE 75  BUN 8  CREATININE 0.61  CALCIUM 8.7   No results for input(s): AST, ALT, ALKPHOS, BILITOT, PROT, ALBUMIN in the last 72 hours. No results for input(s): LIPASE, AMYLASE in the last 72 hours.  Recent Labs  03/08/14 1840  WBC 4.8  HGB 11.9*  HCT 37.0*  MCV 85.1  PLT 234   No results for input(s): CKTOTAL, CKMB, CKMBINDEX, TROPONINI in the last 72 hours. No results for input(s): DDIMER in the last 72 hours. Invalid input(s): POCBNP   RADIOLOGIC STUDIES ON ADMISSION: Ct Head Wo Contrast   (if New Onset  Seizure And/or Head Trauma)  03/08/2014   CLINICAL DATA:  Metastatic lung cancer, sudden onset of seizure today.  EXAM: CT HEAD WITHOUT CONTRAST  TECHNIQUE: Contiguous axial images were obtained from the base of the skull through the vertex without intravenous contrast.  COMPARISON:  Brain MRI 08/15/2013  FINDINGS: No midline shift is noted. No destructive bony lesion. No acute fractures. Paranasal sinuses and mastoid air cells are unremarkable.  In axial image 22 there is high density probable hemorrhagic metastasis in right parietal lobe with surrounding vasogenic edema. The lesion measures about 1.2 cm. There is no evidence of active surrounding hemorrhage or intraventricular hemorrhage.  In axial image 28 there is serpiginous high density material in right parietal lobe high convexity with gyral disposition measures about 75 Hounsfield units in attenuation. This most likely represents gyral or cortical calcification at the site of  previous radiation necrosis rather then petechial parenchymal hemorrhage. Further correlation with MRI is recommended.  IMPRESSION: In axial image 22 there is high density probable hemorrhagic metastasis in right parietal lobe with surrounding vasogenic edema. The lesion measures about 1.2 cm. There is no evidence of active surrounding hemorrhage or intraventricular hemorrhage.  In axial image 28 there is serpiginous high density material in right parietal lobe high convexity with gyral disposition measures about 75 Hounsfield units in attenuation. 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 Further correlation with MRI is recommended.  These results were called by telephone at the time of interpretation on 03/08/2014 at 6:14 pm to Dr. Dorie Rank , who verbally acknowledged these results.   Electronically Signed   By: Lahoma Crocker M.D.   On: 03/08/2014 18:15   Dg Chest Port 1 View  03/08/2014   CLINICAL DATA:  Weakness and fever, history of lung carcinoma  EXAM: PORTABLE CHEST - 1 VIEW  COMPARISON:  12/22/2013  FINDINGS: Cardiac shadow is stable from the prior exam. A new right-sided pleural effusion and likely right basilar infiltrate is noted. Slightly more prominent pulmonary nodules are noted bilaterally consistent with the patient's given clinical history.  IMPRESSION: Increase in the degree of pulmonary nodularity when compare with the prior exam.  New right basilar effusion with likely underlying infiltrate.   Electronically Signed   By: Inez Catalina M.D.   On: 03/08/2014 17:51     ASSESSMENT AND PLAN: Present on Admission:  . Partial complex seizure: Secondary to hemorrhagic metastases-known history of non-small cell cancer with brain metastases. Seen by neurology, recommendations are to continue Keppra. We will also continue IV Decadron. Await further recommendations from neurology.   .  Hemorrhagic Brain met: Likely causing above. Continue Decadron, hold anticoagulation. Will need radiation oncology and medical oncology consultation in a.m. For now monitor closely in step down unit, neurology recommending to keep SBP less than 160   . Non-small cell carcinoma of lung: Status post completion of radiation therapy, apparently getting chemotherapy in [place]. As noted above will need radiation oncology and medical oncology consultation tomorrow morning.   . Right-sided pleural effusion with? 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 Since afebrile, will monitor off antibiotics at this time. Will repeat a 2 view chest x-ray in a.m, and follow accordingly.  . Reported history of Pulmonary embolism: Per patient's girlfriend, patient has started on Fondapurinox for presumed venous thromboembolism-likely pulmonary embolism. Given hemorrhagic  metastatic lesion in the brain, this will be discontinued. We will need to obtain further collaborative data from his oncologist in [place] may need a IVC filter placed if the patient had extensive DVT.  Further plan will depend as patient's clinical course evolves and further radiologic and laboratory data become available. Patient will be monitored closely.  Above noted plan was discussed with brother/girlfriend, they were in agreement.   Terrance Clark (Girlfriend) [phone] Jere Bostrom (Brother) [phone]  DVT Prophylaxis: SCD's  Code Status: Full Code  Disposition Plan: Home when able  Total time spent for admission equals 45 minutes.  [facility] Pager [phone]  If 7PM-7AM, please contact night-coverage [email or web address] Password [facility] 03/08/2014, 8:56 PM

## 2014-03-08 NOTE — ED Notes (Signed)
Unsuccessful attempts at lab collection. Main Phlebotomy called.

## 2014-03-08 NOTE — ED Notes (Signed)
Pt began uncontrollable jerking in legs and left arm. EMS gave 2.5 intranasal Midazolam, then gave 2.5 IV midazolam. Pt not seizing upon arrival. Complaints of SOB, poor oral intake, nausea. Hx of blood clots and stage 4 lung CA w/ mets to the brain.

## 2014-03-08 NOTE — ED Provider Notes (Signed)
CSN: [id number]     Arrival date & time 03/08/14  1625 History   First MD Initiated Contact with Patient 03/08/14 1715     Chief Complaint  Patient presents with  . Seizures    HPI The patient presents to the emergency room with possible seizures. Patient has a history of bone cancer with metastases to the brain according to family. He is getting treatment at the cancer center in [place]. The patient recently completed 14 rounds of chemotherapy and now presents converted to a different type of medication. Patient started having episode this afternoon where his leg started jerking uncontrollably. It then started affecting his left arm. Patient was unable to stop this. He was alert and awake the whole time. Family started to notice that he was having some facial twitching as well. EMS arrived and he was given a diazepam. The symptoms improved although he has continued to have some twitching in the left side of his face. Patient has had poor oral intake. He has been coughing. He does have some nausea. Denies any vomiting or diarrhea. No fevers or coughing. He has never had seizures before. Past Medical History  Diagnosis Date  . H/O oral surgery 04/2012  . Cough   . Shortness of breath     with exertion  . Insomnia     d/t pain and takes Goody's PM  . Hx of radiation therapy 07/16/12- 09/10/12    lower neck and upper chest region , 63 gray in 35 fx  . Hx of radiation therapy 07/18/12    SRS to brain metastasis  . Lung cancer 06/2012    RUL lung  . 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 Cancer     adrenal mets  . History of blood clots   . Lung cancer    Past Surgical History  Procedure Laterality Date  . Foot surgery    . Mouth surgery    . Video bronchoscopy with endobronchial ultrasound Right 06/19/2012    Procedure: VIDEO BRONCHOSCOPY WITH ENDOBRONCHIAL ULTRASOUND;  Surgeon: Collene Gobble, MD;  Location: [facility];  Service: Pulmonary;  Laterality: Right;   Family History  Problem Relation Age  of Onset  . Cancer Maternal Aunt     unknown  . Cancer Mother     breast ca 2007, surgery right breast,chemotherapy,no rad txx  . Cancer Maternal Grandfather     colon   History  Substance Use Topics  . Smoking status: Current Some Day Smoker -- 1.00 packs/day for 22 years    Types: Cigarettes  . Smokeless tobacco: Never Used     Comment: 2 cigs since 06/03/12///ldc  . Alcohol Use: No    Review of Systems  All other systems reviewed and are negative.     Allergies  Review of patient's allergies indicates no known allergies.  Home Medications   Prior to Admission medications   Medication Sig Start Date End Date Taking? Authorizing Provider  albuterol (PROVENTIL HFA;VENTOLIN HFA) 108 (90 BASE) MCG/ACT inhaler Inhale 2 puffs into the lungs every 6 (six) hours as needed for wheezing or shortness of breath.   Yes Historical Provider, MD  fentaNYL (DURAGESIC - DOSED MCG/HR) 75 MCG/HR Place 150 mcg onto the skin every 3 (three) days.   Yes Historical Provider, MD  gabapentin (NEURONTIN) 300 MG capsule Take 300 mg by mouth every 8 (eight) hours.   Yes Historical Provider, MD  ondansetron (ZOFRAN-ODT) 4 MG disintegrating tablet Take 4 mg by mouth every 8 (eight)  hours as needed for nausea or vomiting.   Yes Historical Provider, MD  pantoprazole (PROTONIX) 40 MG tablet Take 40 mg by mouth daily.   Yes Historical Provider, MD  PRESCRIPTION MEDICATION Take 3 tablets by mouth 3 (three) times daily. Symphytum Officinale 30C Homeopathic-- Dissolve under tongue. Rise mouth with water before taking.   Yes Historical Provider, MD  cyclobenzaprine (FLEXERIL) 10 MG tablet Take 1 tablet (10 mg total) by mouth 2 (two) times daily as needed for muscle spasms. Patient not taking: Reported on 03/08/2014 11/29/13   Jasper Riling. Pickering, MD  dexamethasone (DECADRON) 4 MG tablet Take 0.5 tablets (2 mg total) by mouth 2 (two) times daily. Patient not taking: Reported on 03/08/2014 12/18/13   Lora Paula,  MD  docusate sodium (COLACE) 100 MG capsule Take 1 capsule (100 mg total) by mouth every 12 (twelve) hours. Patient not taking: Reported on 03/08/2014 12/22/13   Leota Jacobsen, MD  morphine (MS CONTIN) 30 MG 12 hr tablet Take 1 tablet (30 mg total) by mouth every 12 (twelve) hours. Patient not taking: Reported on 03/08/2014 12/22/13   Lora Paula, MD  oxyCODONE (OXY IR/ROXICODONE) 5 MG immediate release tablet Take 1-3 tablets (5-15 mg total) by mouth every 4 (four) hours as needed for severe pain. Patient not taking: Reported on 03/08/2014 12/19/13   Lora Paula, MD  oxyCODONE-acetaminophen (PERCOCET) 10-325 MG per tablet Take 1 tablet by mouth every 4 (four) hours as needed for pain. Patient not taking: Reported on 03/08/2014 12/11/13   Jarrett Soho Muthersbaugh, PA-C   BP 110/84 mmHg  Pulse 100  Temp(Src) 98.7 F (37.1 C) (Oral)  Resp 22  SpO2 96% Physical Exam  Constitutional: He is oriented to person, place, and time. No distress.  Ill appearing  HENT:  Head: Normocephalic and atraumatic.  Right Ear: External ear normal.  Left Ear: External ear normal.  Mouth/Throat: No oropharyngeal exudate.  Mucous membranes are dry  Eyes: Conjunctivae are normal. Right eye exhibits no discharge. Left eye exhibits no discharge. No scleral icterus.  Neck: Neck supple. No tracheal deviation present.  Cardiovascular: Normal rate, regular rhythm and intact distal pulses.   Pulmonary/Chest: Effort normal and breath sounds normal. No stridor. No respiratory distress. He has no wheezes. He has no rales.  Abdominal: Soft. Bowel sounds are normal. He exhibits no distension. There is no tenderness. There is no rebound and no guarding.  Musculoskeletal: He exhibits no edema or tenderness.  Neurological: He is alert and oriented to person, place, and time. He has normal strength. No cranial nerve deficit (no facial droop, extraocular movements intact, no slurred speech) or sensory deficit. He exhibits  normal muscle tone. He displays no seizure activity. Coordination normal.  Patient has 5 out of 5 plantar flexion strength and grip strength bilaterally, no facial droop, there is some intermittent left-sided facial twitching the patient is able to overcome it with facial movement  Skin: Skin is warm and dry. No rash noted.  Psychiatric: His affect is blunt.  Nursing note and vitals reviewed.   ED Course  Procedures (including critical care time) CRITICAL CARE Performed by: TSVXB,LTJ Total critical care time: 35 Critical care time was exclusive of separately billable procedures and treating other patients. Critical care was necessary to treat or prevent imminent or life-threatening deterioration. Critical care was time spent personally by me on the following activities: development of treatment plan with patient and/or surrogate as well as nursing, discussions with consultants, evaluation of patient's response to  treatment, examination of patient, obtaining history from patient or surrogate, ordering and performing treatments and interventions, ordering and review of laboratory studies, ordering and review of radiographic studies, pulse oximetry and re-evaluation of patient's condition.  Labs Review Labs Reviewed  CBC - Abnormal; Notable for the following:    Hemoglobin 11.9 (*)    HCT 37.0 (*)    All other components within normal limits  URINE RAPID DRUG SCREEN (HOSP PERFORMED) - Abnormal; Notable for the following:    Benzodiazepines POSITIVE (*)    Tetrahydrocannabinol POSITIVE (*)    All other components within normal limits  URINALYSIS, ROUTINE W REFLEX MICROSCOPIC - Abnormal; Notable for the following:    Urobilinogen, UA 2.0 (*)    All other components within normal limits  BASIC METABOLIC PANEL  ETHANOL  CBG MONITORING, ED    Imaging Review Ct Head Wo Contrast   (if New Onset Seizure And/or Head Trauma)  03/08/2014   CLINICAL DATA:  Metastatic lung cancer, sudden onset  of seizure today.  EXAM: CT HEAD WITHOUT CONTRAST  TECHNIQUE: Contiguous axial images were obtained from the base of the skull through the vertex without intravenous contrast.  COMPARISON:  Brain MRI 08/15/2013  FINDINGS: No midline shift is noted. No destructive bony lesion. No acute fractures. Paranasal sinuses and mastoid air cells are unremarkable.  In axial image 22 there is high density probable hemorrhagic metastasis in right parietal lobe with surrounding vasogenic edema. The lesion measures about 1.2 cm. There is no evidence of active surrounding hemorrhage or intraventricular hemorrhage.  In axial image 28 there is serpiginous high density material in right parietal lobe high convexity with gyral disposition measures about 75 Hounsfield units in attenuation. 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 Further correlation with MRI is recommended.  IMPRESSION: In axial image 22 there is high density probable hemorrhagic metastasis in right parietal lobe with surrounding vasogenic edema. The lesion measures about 1.2 cm. There is no evidence of active surrounding hemorrhage or intraventricular hemorrhage.  In axial image 28 there is serpiginous high density material in right parietal lobe high convexity with gyral disposition measures about 75 Hounsfield units in attenuation. 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 Further correlation with MRI is recommended.  These results were called by telephone at the time of interpretation on 03/08/2014 at 6:14 pm to Dr. Dorie Rank , who verbally acknowledged these results.   Electronically Signed   By: Lahoma Crocker M.D.   On: 03/08/2014 18:15   Dg Chest Port 1 View  03/08/2014   CLINICAL DATA:  Weakness and fever, history of lung carcinoma  EXAM: PORTABLE  CHEST - 1 VIEW  COMPARISON:  12/22/2013  FINDINGS: Cardiac shadow is stable from the prior exam. A new right-sided pleural effusion and likely right basilar infiltrate is noted. Slightly more prominent pulmonary nodules are noted bilaterally consistent with the patient's given clinical history.  IMPRESSION: Increase in the degree of pulmonary nodularity when compare with the prior exam.  New right basilar effusion with likely underlying infiltrate.   Electronically Signed   By: Inez Catalina M.D.   On: 03/08/2014 17:51    MDM   Final diagnoses:  Metastasis to brain  Seizure    Pt has remained stable in the ED.  Findings show that he has a new met with hemorrhage and associated edema.  Most likely the source of this new focal seizure.  IV keppra started in the ED.    Pt seen by Neurology.  Recommends admission to the ICU for monitoring.  Hospitalist, Dr Alcario Drought would like intensivist consult to see if they would admit.    D/w Dr Melvyn Novas.  Pt can be admitted to the [facility] ICU.  Recommends consult with hospitalist service.    Dorie Rank, MD 03/08/14 2004

## 2014-03-08 NOTE — ED Notes (Signed)
Main Phlebotomy at bedside.

## 2014-03-08 NOTE — ED Notes (Signed)
Bed: HQ46 Expected date:  Expected time:  Means of arrival:  Comments: Room 16 Focal Seizure/Ca pt

## 2014-03-09 ENCOUNTER — Inpatient Hospital Stay (HOSPITAL_COMMUNITY): Payer: 59

## 2014-03-09 ENCOUNTER — Inpatient Hospital Stay (HOSPITAL_COMMUNITY)
Admission: EM | Admit: 2014-03-09 | Discharge: 2014-03-11 | DRG: 054 | Disposition: A | Discharge status: Home Health | Payer: 59 | Attending: Internal Medicine | Admitting: Internal Medicine

## 2014-03-09 ENCOUNTER — Encounter (HOSPITAL_COMMUNITY): Payer: Self-pay | Admitting: *Deleted

## 2014-03-09 DIAGNOSIS — I82210 Acute embolism and thrombosis of superior vena cava: Secondary | ICD-10-CM | POA: Diagnosis present

## 2014-03-09 DIAGNOSIS — C7970 Secondary malignant neoplasm of unspecified adrenal gland: Secondary | ICD-10-CM | POA: Diagnosis present

## 2014-03-09 DIAGNOSIS — C3490 Malignant neoplasm of unspecified part of unspecified bronchus or lung: Secondary | ICD-10-CM | POA: Diagnosis present

## 2014-03-09 DIAGNOSIS — C7931 Secondary malignant neoplasm of brain: Secondary | ICD-10-CM

## 2014-03-09 DIAGNOSIS — I82290 Acute embolism and thrombosis of other thoracic veins: Secondary | ICD-10-CM | POA: Diagnosis present

## 2014-03-09 DIAGNOSIS — R569 Unspecified convulsions: Secondary | ICD-10-CM | POA: Diagnosis present

## 2014-03-09 DIAGNOSIS — I871 Compression of vein: Secondary | ICD-10-CM | POA: Diagnosis present

## 2014-03-09 DIAGNOSIS — J9601 Acute respiratory failure with hypoxia: Secondary | ICD-10-CM | POA: Diagnosis present

## 2014-03-09 DIAGNOSIS — R531 Weakness: Secondary | ICD-10-CM

## 2014-03-09 DIAGNOSIS — Z681 Body mass index (BMI) 19 or less, adult: Secondary | ICD-10-CM

## 2014-03-09 DIAGNOSIS — Z923 Personal history of irradiation: Secondary | ICD-10-CM

## 2014-03-09 DIAGNOSIS — E43 Unspecified severe protein-calorie malnutrition: Secondary | ICD-10-CM | POA: Diagnosis present

## 2014-03-09 DIAGNOSIS — F17210 Nicotine dependence, cigarettes, uncomplicated: Secondary | ICD-10-CM | POA: Diagnosis present

## 2014-03-09 DIAGNOSIS — I82C11 Acute embolism and thrombosis of right internal jugular vein: Secondary | ICD-10-CM | POA: Diagnosis present

## 2014-03-09 DIAGNOSIS — I8290 Acute embolism and thrombosis of unspecified vein: Secondary | ICD-10-CM

## 2014-03-09 DIAGNOSIS — J90 Pleural effusion, not elsewhere classified: Secondary | ICD-10-CM

## 2014-03-09 DIAGNOSIS — J910 Malignant pleural effusion: Secondary | ICD-10-CM | POA: Diagnosis present

## 2014-03-09 DIAGNOSIS — C7949 Secondary malignant neoplasm of other parts of nervous system: Secondary | ICD-10-CM

## 2014-03-09 DIAGNOSIS — C7951 Secondary malignant neoplasm of bone: Secondary | ICD-10-CM | POA: Diagnosis present

## 2014-03-09 DIAGNOSIS — Z79899 Other long term (current) drug therapy: Secondary | ICD-10-CM | POA: Diagnosis not present

## 2014-03-09 DIAGNOSIS — R0600 Dyspnea, unspecified: Secondary | ICD-10-CM | POA: Diagnosis present

## 2014-03-09 HISTORY — DX: Unspecified convulsions: R56.9

## 2014-03-09 LAB — MRSA PCR SCREENING: MRSA by PCR: NEGATIVE

## 2014-03-09 MED ORDER — OXYCODONE HCL 5 MG PO TABS: 5.0000 mg | ORAL_TABLET | ORAL | Status: DC | PRN

## 2014-03-09 MED ORDER — LORAZEPAM 2 MG/ML IJ SOLN
1.0000 mg | INTRAMUSCULAR | Status: DC | PRN
  Administered 2014-03-09 – 2014-03-10 (×3): 1 mg via INTRAVENOUS
  Filled 2014-03-09 (×2): qty 1

## 2014-03-09 MED ORDER — FENTANYL 25 MCG/HR TD PT72: 150.0000 ug | MEDICATED_PATCH | TRANSDERMAL | Status: DC

## 2014-03-09 MED ORDER — MORPHINE SULFATE ER 30 MG PO TBCR: 30.0000 mg | EXTENDED_RELEASE_TABLET | Freq: Two times a day (BID) | ORAL | Status: DC

## 2014-03-09 MED ORDER — LEVETIRACETAM IN NACL 750 MG/50ML IV SOLN
750.0000 mg | Freq: Two times a day (BID) | INTRAVENOUS | Status: DC
  Filled 2014-03-09: qty 50

## 2014-03-09 MED ORDER — ONDANSETRON HCL 4 MG PO TABS: 4.0000 mg | ORAL_TABLET | Freq: Four times a day (QID) | ORAL | Status: DC | PRN

## 2014-03-09 MED ORDER — FONDAPARINUX SODIUM 7.5 MG/0.6ML ~~LOC~~ SOLN
7.5000 mg | SUBCUTANEOUS | Status: DC
  Filled 2014-03-09: qty 0.6

## 2014-03-09 MED ORDER — CYCLOBENZAPRINE HCL 10 MG PO TABS
10.0000 mg | ORAL_TABLET | Freq: Two times a day (BID) | ORAL | Status: DC | PRN
Start: 2014-03-09 — End: 2014-03-11
  Administered 2014-03-09: 10 mg via ORAL
  Filled 2014-03-09: qty 1

## 2014-03-09 MED ORDER — DEXAMETHASONE SODIUM PHOSPHATE 4 MG/ML IJ SOLN: 4.0000 mg | Freq: Four times a day (QID) | INTRAMUSCULAR | Status: DC

## 2014-03-09 MED ORDER — HYDROMORPHONE HCL 1 MG/ML IJ SOLN
1.0000 mg | INTRAMUSCULAR | Status: DC | PRN
  Administered 2014-03-10 – 2014-03-11 (×3): 1 mg via INTRAVENOUS
  Filled 2014-03-09 (×3): qty 1

## 2014-03-09 MED ORDER — ACETAMINOPHEN 650 MG RE SUPP: 650.0000 mg | Freq: Four times a day (QID) | RECTAL | Status: DC | PRN

## 2014-03-09 MED ORDER — SODIUM CHLORIDE 0.9 % IV SOLN
INTRAVENOUS | Status: DC
  Administered 2014-03-09: 13:00:00 via INTRAVENOUS
  Administered 2014-03-10: 75 mL via INTRAVENOUS
  Administered 2014-03-10: 15:00:00 via INTRAVENOUS
  Administered 2014-03-11: 75 mL via INTRAVENOUS

## 2014-03-09 MED ORDER — SODIUM CHLORIDE 0.9 % IJ SOLN
3.0000 mL | Freq: Two times a day (BID) | INTRAMUSCULAR | Status: DC
  Administered 2014-03-09 – 2014-03-10 (×2): 3 mL via INTRAVENOUS

## 2014-03-09 MED ORDER — PANTOPRAZOLE SODIUM 40 MG PO TBEC
40.0000 mg | DELAYED_RELEASE_TABLET | Freq: Every day | ORAL | Status: DC
  Administered 2014-03-09 – 2014-03-11 (×3): 40 mg via ORAL
  Filled 2014-03-09 (×3): qty 1

## 2014-03-09 MED ORDER — ALBUTEROL SULFATE HFA 108 (90 BASE) MCG/ACT IN AERS: 2.0000 | INHALATION_SPRAY | Freq: Four times a day (QID) | RESPIRATORY_TRACT | Status: DC | PRN

## 2014-03-09 MED ORDER — GABAPENTIN 300 MG PO CAPS: 300.0000 mg | ORAL_CAPSULE | Freq: Three times a day (TID) | ORAL | Status: DC

## 2014-03-09 MED ORDER — GADOBENATE DIMEGLUMINE 529 MG/ML IV SOLN: 15.0000 mL | Freq: Once | INTRAVENOUS | Status: AC | PRN

## 2014-03-09 MED ORDER — LORAZEPAM 2 MG/ML IJ SOLN: 1.0000 mg | Freq: Four times a day (QID) | INTRAMUSCULAR | Status: DC | PRN

## 2014-03-09 MED ORDER — POTASSIUM CHLORIDE CRYS ER 20 MEQ PO TBCR
20.0000 meq | EXTENDED_RELEASE_TABLET | Freq: Every day | ORAL | Status: DC
  Administered 2014-03-09 – 2014-03-11 (×2): 20 meq via ORAL
  Filled 2014-03-09 (×2): qty 1

## 2014-03-09 MED ORDER — GADOBENATE DIMEGLUMINE 529 MG/ML IV SOLN
15.0000 mL | Freq: Once | INTRAVENOUS | Status: AC | PRN
  Administered 2014-03-09: 12 mL via INTRAVENOUS

## 2014-03-09 MED ORDER — SODIUM CHLORIDE 0.9 % IJ SOLN: 3.0000 mL | Freq: Two times a day (BID) | INTRAMUSCULAR | Status: DC

## 2014-03-09 MED ORDER — ACETAMINOPHEN 325 MG PO TABS
650.0000 mg | ORAL_TABLET | Freq: Four times a day (QID) | ORAL | Status: DC | PRN
Start: 2014-03-09 — End: 2014-03-09

## 2014-03-09 MED ORDER — GABAPENTIN 300 MG PO CAPS
300.0000 mg | ORAL_CAPSULE | Freq: Three times a day (TID) | ORAL | Status: DC
  Administered 2014-03-09 – 2014-03-11 (×6): 300 mg via ORAL
  Filled 2014-03-09 (×6): qty 1

## 2014-03-09 MED ORDER — ONDANSETRON HCL 4 MG/2ML IJ SOLN
4.0000 mg | Freq: Four times a day (QID) | INTRAMUSCULAR | Status: DC | PRN
Start: 2014-03-09 — End: 2014-03-09

## 2014-03-09 MED ORDER — HYDROMORPHONE HCL 2 MG PO TABS
4.0000 mg | ORAL_TABLET | ORAL | Status: DC | PRN
  Administered 2014-03-09 – 2014-03-11 (×2): 4 mg via ORAL
  Filled 2014-03-09 (×2): qty 2

## 2014-03-09 MED ORDER — GUAIFENESIN-DM 100-10 MG/5ML PO SYRP: 5.0000 mL | ORAL_SOLUTION | ORAL | Status: DC | PRN

## 2014-03-09 MED ORDER — METOCLOPRAMIDE HCL 10 MG PO TABS
10.0000 mg | ORAL_TABLET | Freq: Four times a day (QID) | ORAL | Status: DC
  Administered 2014-03-09 – 2014-03-11 (×8): 10 mg via ORAL
  Filled 2014-03-09 (×8): qty 1

## 2014-03-09 MED ORDER — ONDANSETRON 4 MG PO TBDP
4.0000 mg | ORAL_TABLET | Freq: Three times a day (TID) | ORAL | Status: DC | PRN
  Filled 2014-03-09: qty 1

## 2014-03-09 MED ORDER — NICOTINE 21 MG/24HR TD PT24
21.0000 mg | MEDICATED_PATCH | Freq: Every day | TRANSDERMAL | Status: DC
  Filled 2014-03-09: qty 1

## 2014-03-09 MED ORDER — PANTOPRAZOLE SODIUM 40 MG PO TBEC: 40.0000 mg | DELAYED_RELEASE_TABLET | Freq: Every day | ORAL | Status: DC

## 2014-03-09 MED ORDER — SODIUM CHLORIDE 0.9 % IV SOLN: INTRAVENOUS | Status: DC

## 2014-03-09 MED ORDER — DEXAMETHASONE SODIUM PHOSPHATE 4 MG/ML IJ SOLN
4.0000 mg | Freq: Four times a day (QID) | INTRAMUSCULAR | Status: DC
  Administered 2014-03-09 – 2014-03-11 (×9): 4 mg via INTRAVENOUS
  Filled 2014-03-09 (×9): qty 1

## 2014-03-09 MED ORDER — ALBUTEROL SULFATE (2.5 MG/3ML) 0.083% IN NEBU: 2.5000 mg | INHALATION_SOLUTION | Freq: Four times a day (QID) | RESPIRATORY_TRACT | Status: DC | PRN

## 2014-03-09 MED ORDER — HYDROMORPHONE HCL 1 MG/ML IJ SOLN: 1.0000 mg | INTRAMUSCULAR | Status: DC | PRN

## 2014-03-09 MED ORDER — DOCUSATE SODIUM 100 MG PO CAPS
100.0000 mg | ORAL_CAPSULE | Freq: Two times a day (BID) | ORAL | Status: DC
  Administered 2014-03-09 – 2014-03-11 (×3): 100 mg via ORAL
  Filled 2014-03-09 (×4): qty 1

## 2014-03-09 MED ORDER — LORAZEPAM 2 MG/ML IJ SOLN
INTRAMUSCULAR | Status: AC
  Filled 2014-03-09: qty 1

## 2014-03-09 MED ORDER — PROMETHAZINE HCL 25 MG PO TABS: 25.0000 mg | ORAL_TABLET | ORAL | Status: DC | PRN

## 2014-03-09 MED ORDER — ALBUTEROL SULFATE (2.5 MG/3ML) 0.083% IN NEBU: 2.5000 mg | INHALATION_SOLUTION | RESPIRATORY_TRACT | Status: DC | PRN

## 2014-03-09 NOTE — ED Notes (Signed)
Pt was here tonight and was going to be admitted d/t bleeding on brain, new onset of seizures and L sided weakness, states he left and didn't want to be admitted, states got home and felt worse, pt states still having L sided weakness. A/o x 4.

## 2014-03-09 NOTE — ED Notes (Addendum)
Patient was previously assigned to [facility] Unit, but then became agitated and verbally abusive to medical staff, telling the [facility] to "Fuck off" Patient left AMA from ED at approximately 0145 Patient now back for same complaints that he was to be admitted for  Patient states that he wants his admission bed back Will have ED Charge speak with patient

## 2014-03-09 NOTE — Progress Notes (Signed)
UR completed 

## 2014-03-09 NOTE — ED Notes (Signed)
Pt was advised that he was going to be seen by the hospitalist and that it would be some time before that would happen; RN advised that the orders that were written on the previous encounter could not be reactivatied and that all new orders would have to be put due to pt leaving AMA; pt states "are you going to get some pain medications and some food?"; RN advised that we did not have any orders from the ER physician to administer anything at this time but as soon as they put in orders or the admitting physician placed orders we would administer them; pt and girlfriend verbalize understanding that will be waiting for admitting physician and orders and due to volume at this time pt would most likely be holding in the ER for a bed.

## 2014-03-09 NOTE — Progress Notes (Signed)
Shift event: RN paged this NP because pt was agitated wanting to go outside and smoke. Attending already paged about this issue and pt was told this was against hospital policy. Attending ordered Nicotine patch. When RN went to place it, pt refused because of the "side effects".  Night RN paged and stated pt was still agitated wanting to leave AMA. RN says pt is intermittently disoriented. NP to bedside. Spoke with pt. Still insists on leaving AMA. Girlfriend, [name], at bedside telling pt he needs to stay. Pt's brother Rae Lips, also on the phone telling pt the same thing. [name] says she can not care for him at home because she has to work and also the pt is having left sided weakness and can not ambulate on his own. This NP spoke with pt's brother on the phone who agreed pt needs to stay as he can not presently care for himself at home. Brother says he is having trouble with confusion and memory and doesn't feel the pt can make good judgements/decisions about his welfare.  Because pt is refusing to stay and wanting to leave AMA, he was IVC'd. There is concern for his safety as he presently can not care for himself on his own and there will be no one to help him at home. He could very well fall and have significant injury or have seizure at home. His girlfriend and brother both support the decision to keep him in the hospital.  Dr. Hal Hope aware of situation and signed papers to be faxed to the magistrate.

## 2014-03-09 NOTE — Progress Notes (Signed)
Pt is stating that he wants to leave room and go outside to smoke ; and then come back. I explained that we had a "no-smoking policy" and that I would call MD for a nicotine patch. Pt stated that he did not want a patch because of the "side effects" such as coughing and feeling his heart racing. Pt said he wants the papers to sign to leave AMA. His girlfriend is in the room and she and I encouraged him to remain at hospital for treatment. Dr. Doyle Askew called and updated . Lionel December

## 2014-03-09 NOTE — ED Notes (Signed)
Pt is angry that he cannot walk around the hospital or go outside w/o assistance d/t seizure activity.  Dr. Florina Ou aware as is Dr. Sloan Leiter.  Encouraged pt to stay and explained that leaving [place] [facility] could lead to multiple poor outcomes however pt remains adamant to leave.  Pt removed his IV and said he wanted to, "get the fuck out of here!"

## 2014-03-09 NOTE — ED Provider Notes (Signed)
This is a 38 year old male with known metastatic cancer who is being admitted for newly discovered brain metastases after having a seizure. He became irate in the ED and left AGAINST MEDICAL ADVICE. He had a change of heart and returned to the ED requesting to be admitted.  Filed Vitals:   03/09/14 0600  BP: 117/85  Pulse: 84  Temp:   Resp: 29   The patient is awake and alert in no acute distress. Vital signs are normal. Dr. Alcario Drought of [facility] will arrange for his admission.     Wynetta Fines, MD 03/09/14 [phone]

## 2014-03-09 NOTE — H&P (Addendum)
[facility] History and Physical  Terrance Clark NLZ:[id number] DOB: [birth year] DOA: 03/09/2014  Referring physician: ED physician PCP: No PCP Per Patient   Chief Complaint: seizures and weakness   HPI:  38 y.o. 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 Pt is not willing to provide much history at the time of the admission and says "its all in the records". SDU requested after ED doctor spoke with PCCM on call. Per review of records, pt apparently recently diagnosed with blood clots and was started on Fondapurinix injection. Nursing staff in ED, reported that pt was trying to get out of the bed but to weak to support weight and almost fell.    Assessment and Plan: Active Problems: Seizure activity - possibly from metastatic disease - MRI brain with SRS protocol requested as Dr. Tammi Klippel recommended - started pt on Decadron IV for now - paged neurosurgery on call to review images  - monitor closely in SDU  NSCLC with metastatic disease - will d/w oncology on call, Dr. Julien Nordmann is on call in AM so will call in AM Weakness in upper and lower extremities - mostly on the left side - pt with known diffuse mets in the spine - will hold off on further imaging of the spine for now - PT eval once pt able to tolerate  Severe PCM - in the context of chronic illness as outlined above - advance diet as pt able to tolerate  Hx of blood clots - will hold anticoagulation for now until MRI brain back as CT head notable for hemorrhagic mets in the right parietal lobe  - SCD's for DVT prophylaxis   Radiological Exams on Admission: Dg Chest 2 View  03/09/2014  Stable  exam since earlier today. Moderate right pleural effusion and small left pleural effusion. Right lower lobe atelectasis. Bilateral pulmonary nodules.    Ct Head Wo Contrast   03/08/2014    In axial image 22 there is high density probable hemorrhagic metastasis in right parietal lobe with surrounding vasogenic edema. The lesion measures about 1.2 cm. There is no evidence of active surrounding hemorrhage or intraventricular hemorrhage.  In axial image 28 there is serpiginous high density material in right parietal lobe high convexity with gyral disposition measures about 75 Hounsfield units in attenuation. 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   Code Status: Full Family Communication: Pt and girlfriend at bedside Disposition Plan: Admit for further evaluation     Review of Systems:  Pt not willing to provide details on HPI and ROS    Past Medical History  Diagnosis Date  . H/O oral surgery 04/2012  . Cough   . Shortness of breath     with exertion  . Insomnia     d/t pain and takes Goody's PM  . Hx of radiation therapy 07/16/12- 09/10/12    lower neck and upper chest region , 63 gray in 35 fx  . Hx of radiation therapy 07/18/12    SRS to brain metastasis  . Lung cancer 06/2012    RUL lung  . 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 Cancer     adrenal  mets  . History of blood clots   . Lung cancer   . Seizures     Past Surgical History  Procedure Laterality Date  . Foot surgery    . Mouth surgery    . Video bronchoscopy with endobronchial ultrasound Right 06/19/2012    Procedure: VIDEO BRONCHOSCOPY WITH ENDOBRONCHIAL ULTRASOUND;  Surgeon: Collene Gobble, MD;  Location: [facility];  Service: Pulmonary;  Laterality: Right;    Social History:  reports that he has been smoking Cigarettes.  He has a 22 pack-year smoking history. He has never used smokeless tobacco. He reports that he uses illicit  drugs (Marijuana). He reports that he does not drink alcohol.  No Known Allergies  Family History  Problem Relation Age of Onset  . Cancer Maternal Aunt     unknown  . Cancer Mother     breast ca 2007, surgery right breast,chemotherapy,no rad txx  . Cancer Maternal Grandfather     colon    Prior to Admission medications   Medication Sig Start Date End Date Taking? Authorizing Provider  albuterol (PROVENTIL HFA;VENTOLIN HFA) 108 (90 BASE) MCG/ACT inhaler Inhale 2 puffs into the lungs every 6 (six) hours as needed for wheezing or shortness of breath.   Yes Historical Provider, MD  fentaNYL (DURAGESIC - DOSED MCG/HR) 75 MCG/HR Place 150 mcg onto the skin every 3 (three) days.   Yes Historical Provider, MD  fondaparinux (ARIXTRA) 7.5 MG/0.6ML SOLN injection Inject 7.5 mg into the skin daily.   Yes Historical Provider, MD  gabapentin (NEURONTIN) 300 MG capsule Take 300 mg by mouth every 8 (eight) hours.   Yes Historical Provider, MD  HYDROmorphone (DILAUDID) 4 MG tablet Take 4 mg by mouth every 4 (four) hours as needed for severe pain.   Yes Historical Provider, MD  metoCLOPramide (REGLAN) 10 MG tablet Take 10 mg by mouth 4 (four) times daily.   Yes Historical Provider, MD  pantoprazole (PROTONIX) 40 MG tablet Take 40 mg by mouth daily.   Yes Historical Provider, MD  potassium chloride SA (K-DUR,KLOR-CON) 20 MEQ tablet Take 20 mEq by mouth daily.   Yes Historical Provider, MD  cyclobenzaprine (FLEXERIL) 10 MG tablet Take 1 tablet (10 mg total) by mouth 2 (two) times daily as needed for muscle spasms. Patient not taking: Reported on 03/08/2014 11/29/13   Jasper Riling. Pickering, MD  dexamethasone (DECADRON) 4 MG tablet Take 0.5 tablets (2 mg total) by mouth 2 (two) times daily. Patient not taking: Reported on 03/08/2014 12/18/13   Lora Paula, MD  docusate sodium (COLACE) 100 MG capsule Take 1 capsule (100 mg total) by mouth every 12 (twelve) hours. Patient not taking: Reported on 03/08/2014  12/22/13   Leota Jacobsen, MD  morphine (MS CONTIN) 30 MG 12 hr tablet Take 1 tablet (30 mg total) by mouth every 12 (twelve) hours. Patient not taking: Reported on 03/08/2014 12/22/13   Lora Paula, MD  ondansetron (ZOFRAN-ODT) 4 MG disintegrating tablet Take 4 mg by mouth every 8 (eight) hours as needed for nausea or vomiting.    Historical Provider, MD  oxyCODONE (OXY IR/ROXICODONE) 5 MG immediate release tablet Take 1-3 tablets (5-15 mg total) by mouth every 4 (four) hours as needed for severe pain. Patient not taking: Reported on 03/08/2014 12/19/13   Lora Paula, MD  oxyCODONE-acetaminophen (PERCOCET) 10-325 MG per tablet Take 1 tablet by mouth every 4 (four) hours as needed for pain. Patient not taking: Reported on 03/08/2014 12/11/13   Jarrett Soho Muthersbaugh,  PA-C  PRESCRIPTION MEDICATION Take 3 tablets by mouth 3 (three) times daily. Symphytum Officinale 30C Homeopathic-- Dissolve under tongue. Rise mouth with water before taking.    Historical Provider, MD    Physical Exam: Filed Vitals:   03/09/14 0500 03/09/14 0515 03/09/14 0530 03/09/14 0600  BP: 107/75  102/82 117/85  Pulse: 89 86 88 84  Temp:      TempSrc:      Resp: 30 16 18 29   SpO2: 95% 94% 97% 94%    Physical Exam  Constitutional: Appears frail and weak HENT: Normocephalic. External right and left ear normal. Dry MM Eyes: Conjunctivae and EOM are normal. PERRLA, no scleral icterus.  Neck: Normal ROM. Neck supple. No JVD. No tracheal deviation. No thyromegaly.  CVS: RRR, S1/S2 +, no murmurs, no gallops, no carotid bruit.  Pulmonary: Effort and breath sounds normal, no stridor, rhonchi at bases  Abdominal: Soft. BS +,  no distension, tenderness, rebound or guarding.  Musculoskeletal: No edema and no tenderness.  Lymphadenopathy: No lymphadenopathy noted, cervical, inguinal. Neuro: Alert. Left upper and lower extremity strength 3/5, on the left 4/5, sensation also diminished on the right side  Skin: Skin is  warm and dry. No rash noted. Not diaphoretic. No erythema. No pallor.  Psychiatric: Agitated with questions and exam   Labs on Admission:  Basic Metabolic Panel:  Recent Labs Lab 03/08/14 1840  NA 136  K 4.0  CL 103  CO2 24  GLUCOSE 75  BUN 8  CREATININE 0.61  CALCIUM 8.7   CBC:  Recent Labs Lab 03/08/14 1840  WBC 4.8  HGB 11.9*  HCT 37.0*  MCV 85.1  PLT 234    EKG: Normal sinus rhythm, no ST/T wave changes  Faye Ramsay, MD  [facility] Pager [phone]  If 7PM-7AM, please contact night-coverage [email or web address] Password [facility] 03/09/2014, 7:43 AM

## 2014-03-10 DIAGNOSIS — C3490 Malignant neoplasm of unspecified part of unspecified bronchus or lung: Secondary | ICD-10-CM

## 2014-03-10 DIAGNOSIS — E43 Unspecified severe protein-calorie malnutrition: Secondary | ICD-10-CM

## 2014-03-10 DIAGNOSIS — C799 Secondary malignant neoplasm of unspecified site: Secondary | ICD-10-CM

## 2014-03-10 DIAGNOSIS — R569 Unspecified convulsions: Secondary | ICD-10-CM

## 2014-03-10 DIAGNOSIS — R0600 Dyspnea, unspecified: Secondary | ICD-10-CM

## 2014-03-10 LAB — BASIC METABOLIC PANEL
Anion gap: 9 (ref 5–15)
BUN: 14 mg/dL (ref 6–23)
CO2: 22 mmol/L (ref 19–32)
CREATININE: 0.73 mg/dL (ref 0.50–1.35)
Calcium: 8.4 mg/dL (ref 8.4–10.5)
Chloride: 103 mEq/L (ref 96–112)
GFR calc Af Amer: >90 mL/min (ref >90)
GFR calc non Af Amer: >90 mL/min (ref >90)
GLUCOSE: 103 mg/dL — AB (ref 70–99)
Potassium: 4.6 mmol/L (ref 3.5–5.1)
Sodium: 134 mmol/L — ABNORMAL LOW (ref 135–145)

## 2014-03-10 LAB — CBC
HEMATOCRIT: 40 % (ref 39.0–52.0)
HEMOGLOBIN: 12.8 g/dL — AB (ref 13.0–17.0)
MCH: 26.9 pg (ref 26.0–34.0)
MCHC: 32 g/dL (ref 30.0–36.0)
MCV: 84 fL (ref 78.0–100.0)
Platelets: 276 10*3/uL (ref 150–400)
RBC: 4.76 MIL/uL (ref 4.22–5.81)
RDW: 14.9 % (ref 11.5–15.5)
WBC: 3.5 10*3/uL — ABNORMAL LOW (ref 4.0–10.5)

## 2014-03-10 MED ORDER — LEVETIRACETAM 500 MG PO TABS
500.0000 mg | ORAL_TABLET | Freq: Two times a day (BID) | ORAL | Status: DC
  Administered 2014-03-11: 500 mg via ORAL
  Filled 2014-03-10: qty 1

## 2014-03-10 MED ORDER — METOPROLOL TARTRATE 1 MG/ML IV SOLN
2.5000 mg | Freq: Four times a day (QID) | INTRAVENOUS | Status: DC
  Administered 2014-03-10 – 2014-03-11 (×5): 2.5 mg via INTRAVENOUS
  Filled 2014-03-10 (×5): qty 5

## 2014-03-10 MED ORDER — LEVETIRACETAM IN NACL 1000 MG/100ML IV SOLN
1000.0000 mg | Freq: Once | INTRAVENOUS | Status: AC
  Administered 2014-03-10: 1000 mg via INTRAVENOUS
  Filled 2014-03-10: qty 100

## 2014-03-10 NOTE — Clinical Documentation Improvement (Signed)
  Jw was admitted to the hospital with new onset L sided weakness, seizures and known brain metastasis. The MRI of the Brain completed on 03/09/14 showed white matter (vasogenic) edema. Please state if you agree or disagree with the MRI Brain findings of cerebra (vasogenic) edema.   Thank you for your time with this!   Almon Register, RN Clinical Documentation Improvement Specialist ([id number] / [phone]

## 2014-03-10 NOTE — Progress Notes (Signed)
INITIAL NUTRITION ASSESSMENT  DOCUMENTATION CODES Per approved criteria  -Severe malnutrition in the context of chronic illness -Underweight  Pt meets criteria for severe MALNUTRITION in the context of chronic illness as evidenced by 9.3% body weight loss in 3 months, PO intake < 75% for ~ one month.   INTERVENTION: -Recommend MagicCup TID w/meals, each supplement provides 290 kcal, 9 gram protein -Encouraged intake of supplementation and snacks to assist with weight and muscle maintenance -Continue with anti-emetic regimen -RD to continue to monitor  NUTRITION DIAGNOSIS: Inadequate oral intake  related to nausea/vomiting as evidenced by PO intake < 75% for > two weeks, wt loss.   Goal: Pt to meet >/= 90% of their estimated nutrition needs    Monitor:  Total protein/energy intake, labs, weights, supplement tolerance, GI profile  Reason for Assessment: MST  38 y.o. male  Admitting Dx: <principal problem not specified>  ASSESSMENT: 37 y.o. 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 -Has tried different supplements (Boost, Ensure, [employer]) when pt was first diagnosed; however pt did not like any type and did not continue with regimen -Endorsed weight loss; however was unable to quantify amount. Previous medical records indicate pt with 12 lb wt loss in 3 months (9.4% body weight loss, severe for time frame) -Ate well last night, 75% of meals; has not  had any PO intake today as pt has been sleeping through meals -Nausea has improved with modification in anti-emetic medications -Girlfriend noted pt may enjoy MagicCup supplements w/meals. Will order and assess tolerance  Height: Ht Readings from Last 1 Encounters:  03/09/14 5\' 11"  (1.803 m)    Weight: Wt Readings from Last 1 Encounters:  03/10/14 116 lb 6.5 oz (52.8 kg)    Ideal Body Weight: 172 lb  % Ideal Body Weight: 67%  Wt Readings from Last 10 Encounters:  03/10/14 116 lb 6.5 oz (52.8 kg)  12/22/13 128 lb (58.06 kg)  11/29/13 147 lb (66.679 kg)  09/01/13 139 lb 1.6 oz (63.095 kg)  08/18/13 139 lb 1.6 oz (63.095 kg)  07/21/13 136 lb 8 oz (61.916 kg)  07/16/13 138 lb 3.2 oz (62.687 kg)  06/25/13 140 lb 3.2 oz (63.594 kg)  06/16/13 141 lb 11.2 oz (64.275 kg)  06/04/13 140 lb 11.2 oz (63.821 kg)    Usual Body Weight: 140 lb  % Usual Body Weight: 83%  BMI:  Body mass index is 16.24 kg/(m^2). Underweight  Estimated Nutritional Needs: Kcal: 1650-1850 Protein: 80-90 gram Fluid: >/=1850 ml daily  Skin: WDL  Diet Order: Diet regular  EDUCATION NEEDS: -No education needs identified at this time   Intake/Output Summary (Last 24 hours) at 03/10/14 1213 Last data filed at 03/10/14 1000  Gross per 24 hour  Intake 1956.25 ml  Output    502 ml  Net 1454.25 ml    Last BM: 12/28   Labs:   Recent Labs Lab 03/08/14 1840 03/10/14 0340  NA 136 134*  K 4.0 4.6  CL 103 103  CO2 24  22  BUN 8 14  CREATININE 0.61 0.73  CALCIUM 8.7 8.4  GLUCOSE 75 103*    CBG (last 3)  No results for input(s): GLUCAP in the last 72 hours.  Scheduled Meds: . dexamethasone  4 mg Intravenous 4 times per day  . docusate sodium  100 mg Oral Q12H  . gabapentin  300 mg Oral Q8H  . metoCLOPramide  10 mg Oral QID  . metoprolol  2.5 mg Intravenous 4 times per day  . nicotine  21 mg Transdermal Daily  . pantoprazole  40 mg Oral Daily  . potassium chloride SA  20 mEq Oral Daily   . sodium chloride  3 mL Intravenous Q12H    Continuous Infusions: . sodium chloride 75 mL (03/10/14 0208)    Past Medical History  Diagnosis Date  . H/O oral surgery 04/2012  . Cough   . Shortness of breath     with exertion  . Insomnia     d/t pain and takes Goody's PM  . Hx of radiation therapy 07/16/12- 09/10/12    lower neck and upper chest region , 63 gray in 35 fx  . Hx of radiation therapy 07/18/12    SRS to brain metastasis  . Lung cancer 06/2012    RUL lung  . 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 Cancer     adrenal mets  . History of blood clots   . Lung cancer   . Seizures     Past Surgical History  Procedure Laterality Date  . Foot surgery    . Mouth surgery    . Video bronchoscopy with endobronchial ultrasound Right 06/19/2012    Procedure: VIDEO BRONCHOSCOPY WITH ENDOBRONCHIAL ULTRASOUND;  Surgeon: Collene Gobble, MD;  Location: [facility];  Service: Pulmonary;  Laterality: Right;    Atlee Abide MS RD [place] Clinical Dietitian [phone]

## 2014-03-10 NOTE — Consult Note (Signed)
Reason for Consult:Seizures Referring Physician: Rizwan  CC: Seizures  HPI: Terrance Clark is an 38 y.o. male with a history of metastatic non-small cell lung cancer.  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  Patient presented with left sided weakness that was worsening and involuntary movements at the left neck and lower extremity.  Patient was started on Decadron.  Was noted on yesterday to have an episode of jerking of the LLE that progressed to the upper extremity and face.  Patient reports that jerking lasted for about 30 minutes.  Since that episode the patient has had intermittent jerking of the LUE that has been frequent.  The patient has been numb on the left side since the episode of jerking yesterday.  Past Medical History  Diagnosis Date  . H/O oral surgery 04/2012  . Cough   . Shortness of breath     with exertion  . Insomnia     d/t pain and takes [place] PM  . Hx of radiation therapy 07/16/12- 09/10/12    lower neck and upper chest region , 63 gray in 35 fx  . Hx of radiation therapy 07/18/12    SRS to brain metastasis  . Lung cancer 06/2012    RUL lung  . 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 Cancer     adrenal mets  . History of blood clots   . Lung cancer   . Seizures     Past Surgical History  Procedure Laterality Date  . Foot surgery    . Mouth surgery    . Video bronchoscopy with endobronchial ultrasound Right 06/19/2012    Procedure: VIDEO BRONCHOSCOPY WITH ENDOBRONCHIAL ULTRASOUND;  Surgeon: Collene Gobble, MD;  Location: [facility];  Service: Pulmonary;  Laterality: Right;    Family History  Problem Relation Age of Onset  . Cancer Maternal Aunt     unknown  . Cancer Mother     breast ca 2007, surgery right breast,chemotherapy,no rad txx  . Cancer Maternal Grandfather     colon    Social History:  reports that he has been smoking Cigarettes.  He has a 22 pack-year smoking history. He has never used smokeless tobacco. He reports that he uses illicit drugs (Marijuana). He  reports that he does not drink alcohol.  No Known Allergies  Medications:  I have reviewed the patient's current medications. Prior to Admission:  Prescriptions prior to admission  Medication Sig Dispense Refill Last Dose  . albuterol (PROVENTIL HFA;VENTOLIN HFA) 108 (90 BASE) MCG/ACT inhaler Inhale 2 puffs into the lungs every 6 (six) hours as needed for wheezing or shortness of breath.   Past Month at Unknown time  . fentaNYL (DURAGESIC - DOSED MCG/HR) 75 MCG/HR Place 150 mcg onto the skin every 3 (three) days.   03/08/2014 at Unknown time  . fondaparinux (ARIXTRA) 7.5 MG/0.6ML SOLN injection Inject 7.5 mg into the skin daily.   03/08/2014 at Unknown time  . gabapentin (NEURONTIN) 300 MG capsule Take 300 mg by mouth every 8 (eight) hours.   03/08/2014 at Unknown time  . HYDROmorphone (DILAUDID) 4 MG tablet Take 4 mg by mouth every 4 (four) hours as needed for severe pain.   03/08/2014 at Unknown time  . metoCLOPramide (REGLAN) 10 MG tablet Take 10 mg by mouth 4 (four) times daily.   03/08/2014 at Unknown time  . pantoprazole (PROTONIX) 40 MG tablet Take 40 mg by mouth daily.   03/08/2014 at Unknown time  . potassium chloride SA (K-DUR,KLOR-CON) 20  MEQ tablet Take 20 mEq by mouth daily.   03/08/2014 at Unknown time  . cyclobenzaprine (FLEXERIL) 10 MG tablet Take 1 tablet (10 mg total) by mouth 2 (two) times daily as needed for muscle spasms. (Patient not taking: Reported on 03/08/2014) 20 tablet 0 Past Month at Unknown time  . dexamethasone (DECADRON) 4 MG tablet Take 0.5 tablets (2 mg total) by mouth 2 (two) times daily. (Patient not taking: Reported on 03/08/2014) 30 tablet 3 12/21/2013 at Unknown time  . docusate sodium (COLACE) 100 MG capsule Take 1 capsule (100 mg total) by mouth every 12 (twelve) hours. (Patient not taking: Reported on 03/08/2014) 60 capsule 0   . morphine (MS CONTIN) 30 MG 12 hr tablet Take 1 tablet (30 mg total) by mouth every 12 (twelve) hours. (Patient not taking:  Reported on 03/08/2014) 60 tablet 0   . ondansetron (ZOFRAN-ODT) 4 MG disintegrating tablet Take 4 mg by mouth every 8 (eight) hours as needed for nausea or vomiting.   03/07/2014 at Unknown time  . oxyCODONE (OXY IR/ROXICODONE) 5 MG immediate release tablet Take 1-3 tablets (5-15 mg total) by mouth every 4 (four) hours as needed for severe pain. (Patient not taking: Reported on 03/08/2014) 120 tablet 0 12/21/2013 at Unknown time  . oxyCODONE-acetaminophen (PERCOCET) 10-325 MG per tablet Take 1 tablet by mouth every 4 (four) hours as needed for pain. (Patient not taking: Reported on 03/08/2014) 30 tablet 0 Past Week at Unknown time  . PRESCRIPTION MEDICATION Take 3 tablets by mouth 3 (three) times daily. Symphytum Officinale 30C Homeopathic-- Dissolve under tongue. Rise mouth with water before taking.   unknown at unknown   Scheduled: . dexamethasone  4 mg Intravenous 4 times per day  . docusate sodium  100 mg Oral Q12H  . gabapentin  300 mg Oral Q8H  . metoCLOPramide  10 mg Oral QID  . metoprolol  2.5 mg Intravenous 4 times per day  . nicotine  21 mg Transdermal Daily  . pantoprazole  40 mg Oral Daily  . potassium chloride SA  20 mEq Oral Daily  . sodium chloride  3 mL Intravenous Q12H    ROS: History obtained from the patient  General ROS: negative for - chills, fatigue, fever, night sweats, weight gain or weight loss Psychological ROS: depression Ophthalmic ROS: negative for - blurry vision, double vision, eye pain or loss of vision ENT ROS: negative for - epistaxis, nasal discharge, oral lesions, sore throat, tinnitus or vertigo Allergy and Immunology ROS: negative for - hives or itchy/watery eyes Hematological and Lymphatic ROS: negative for - bleeding problems, bruising or swollen lymph nodes Endocrine ROS: negative for - galactorrhea, hair pattern changes, polydipsia/polyuria or temperature intolerance Respiratory ROS: cough, shortness of breath Cardiovascular ROS: negative for -  chest pain, dyspnea on exertion, edema or irregular heartbeat Gastrointestinal ROS: negative for - abdominal pain, diarrhea, hematemesis, nausea/vomiting or stool incontinence Genito-Urinary ROS: negative for - dysuria, hematuria, incontinence or urinary frequency/urgency Musculoskeletal ROS: negative for - joint swelling or muscular weakness Neurological ROS: as noted in HPI Dermatological ROS: negative for rash and skin lesion changes  Physical Examination: Blood pressure 134/104, pulse 108, temperature 98.5 F (36.9 C), temperature source Oral, resp. rate 36, height 5\' 11"  (1.803 m), weight 52.8 kg (116 lb 6.5 oz), SpO2 96 %.  HEENT-  Normocephalic, no lesions, without obvious abnormality.  Normal external eye and conjunctiva.  Normal TM's bilaterally.  Normal auditory canals and external ears. Normal external nose, mucus membranes and septum.  Normal pharynx. Cardiovascular- S1, S2 normal, pulses palpable throughout   Lungs- chest clear, no wheezing, rales, normal symmetric air entry Abdomen- soft, non-tender; bowel sounds normal; no masses,  no organomegaly Extremities- no edema Lymph-no adenopathy palpable Musculoskeletal-no joint tenderness, deformity or swelling Skin-warm and dry, no hyperpigmentation, vitiligo, or suspicious lesions  Neurological Examination Mental Status: Alert, oriented, thought content appropriate.  Flat affect.  Speech fluent without evidence of aphasia.  Able to follow 3 step commands without difficulty. Cranial Nerves: II: Discs flat bilaterally; Visual fields grossly normal, pupils equal, round, reactive to light and accommodation III,IV, VI: ptosis not present, extra-ocular motions intact bilaterally V,VII: smile symmetric, facial light touch sensation normal bilaterally VIII: hearing normal bilaterally IX,X: gag reflex present XI: bilateral shoulder shrug XII: midline tongue extension Motor: Right : Upper extremity   5/5    Left:     Upper extremity    3-/5  Lower extremity   5/5     Lower extremity   3-/5 Tone and bulk:normal tone throughout; no atrophy noted Sensory: Pinprick and light touch decreased in the left upper and lower extremity. Deep Tendon Reflexes: 2+ and symmetric throughout Plantars: Right: downgoing   Left: downgoing Cerebellar: normal finger-to-nose and normal heel-to-shin testing on the right.  Limited by weakness on the left Gait: not tested due to safety   Laboratory Studies:   Basic Metabolic Panel:  Recent Labs Lab 03/08/14 1840 03/10/14 0340  NA 136 134*  K 4.0 4.6  CL 103 103  CO2 24 22  GLUCOSE 75 103*  BUN 8 14  CREATININE 0.61 0.73  CALCIUM 8.7 8.4    Liver Function Tests: No results for input(s): AST, ALT, ALKPHOS, BILITOT, PROT, ALBUMIN in the last 168 hours. No results for input(s): LIPASE, AMYLASE in the last 168 hours. No results for input(s): AMMONIA in the last 168 hours.  CBC:  Recent Labs Lab 03/08/14 1840 03/10/14 0340  WBC 4.8 3.5*  HGB 11.9* 12.8*  HCT 37.0* 40.0  MCV 85.1 84.0  PLT 234 276    Cardiac Enzymes: No results for input(s): CKTOTAL, CKMB, CKMBINDEX, TROPONINI in the last 168 hours.  BNP: Invalid input(s): POCBNP  CBG: No results for input(s): GLUCAP in the last 168 hours.  Microbiology: Results for orders placed or performed during the hospital encounter of 03/09/14  MRSA PCR Screening     Status: None   Collection Time: 03/09/14 11:05 AM  Result Value Ref Range Status   MRSA by PCR NEGATIVE NEGATIVE Final    Comment:        The GeneXpert MRSA Assay (FDA approved for NASAL specimens only), is one component of a comprehensive MRSA colonization surveillance program. It is not intended to diagnose MRSA infection nor to guide or monitor treatment for MRSA infections.     Coagulation Studies: No results for input(s): LABPROT, INR in the last 72 hours.  Urinalysis:  Recent Labs Lab 03/08/14 1911  COLORURINE YELLOW  LABSPEC 1.019   PHURINE 8.0  GLUCOSEU NEGATIVE  HGBUR NEGATIVE  BILIRUBINUR NEGATIVE  KETONESUR NEGATIVE  PROTEINUR NEGATIVE  UROBILINOGEN 2.0*  NITRITE NEGATIVE  LEUKOCYTESUR NEGATIVE    Lipid Panel:  No results found for: CHOL, TRIG, HDL, CHOLHDL, VLDL, LDLCALC  HgbA1C: No results found for: HGBA1C  Urine Drug Screen:     Component Value Date/Time   LABOPIA NONE DETECTED 03/08/2014 1911   COCAINSCRNUR NONE DETECTED 03/08/2014 1911   LABBENZ POSITIVE* 03/08/2014 1911   AMPHETMU NONE DETECTED 03/08/2014 1911  THCU POSITIVE* 03/08/2014 1911   LABBARB NONE DETECTED 03/08/2014 1911    Alcohol Level:  Recent Labs Lab 03/08/14 1840  ETH <5    Imaging: Dg Chest 2 View  03/09/2014   CLINICAL DATA:  Lung cancer. Shortness of breath, poor oral intake, nausea.  EXAM: CHEST  2 VIEW  COMPARISON:  03/08/2014  FINDINGS: Stable moderate right pleural effusion with right lower lobe opacity, likely atelectasis. Bilateral pulmonary nodularity again noted. Heart is normal size. Small left pleural effusion noted. No acute bony abnormality.  IMPRESSION: Stable exam since earlier today. Moderate right pleural effusion and small left pleural effusion. Right lower lobe atelectasis. Bilateral pulmonary nodules.   Electronically Signed   By: Rolm Baptise M.D.   On: 03/09/2014 01:19   Ct Head Wo Contrast   (if New Onset Seizure And/or Head Trauma)  03/08/2014   CLINICAL DATA:  Metastatic lung cancer, sudden onset of seizure today.  EXAM: CT HEAD WITHOUT CONTRAST  TECHNIQUE: Contiguous axial images were obtained from the base of the skull through the vertex without intravenous contrast.  COMPARISON:  Brain MRI 08/15/2013  FINDINGS: No midline shift is noted. No destructive bony lesion. No acute fractures. Paranasal sinuses and mastoid air cells are unremarkable.  In axial image 22 there is high density probable hemorrhagic metastasis in right parietal lobe with surrounding vasogenic edema. The lesion measures about  1.2 cm. There is no evidence of active surrounding hemorrhage or intraventricular hemorrhage.  In axial image 28 there is serpiginous high density material in right parietal lobe high convexity with gyral disposition measures about 75 Hounsfield units in attenuation. 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 Further correlation with MRI is recommended.  IMPRESSION: In axial image 22 there is high density probable hemorrhagic metastasis in right parietal lobe with surrounding vasogenic edema. The lesion measures about 1.2 cm. There is no evidence of active surrounding hemorrhage or intraventricular hemorrhage.  In axial image 28 there is serpiginous high density material in right parietal lobe high convexity with gyral disposition measures about 75 Hounsfield units in attenuation. 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 Further correlation with MRI is recommended.  These results were called by telephone at the time of interpretation on 03/08/2014 at 6:14 pm to Dr. Dorie Rank , who verbally acknowledged these results.   Electronically Signed   By: Lahoma Crocker M.D.   On: 03/08/2014 18:15   Mr Jeri Cos EP Contrast  03/09/2014   CLINICAL DATA:  Metastatic lung cancer.  Seizure.  Abnormal CT  EXAM: MRI HEAD WITHOUT AND WITH CONTRAST  TECHNIQUE: Multiplanar, multiecho pulse sequences of the brain and surrounding structures were obtained without and with intravenous contrast.  CONTRAST:  80mL MULTIHANCE GADOBENATE DIMEGLUMINE 529 MG/ML IV SOLN  COMPARISON:  CT head 03/08/2014, MRI 08/15/2013, 07/04/2013  FINDINGS: Image quality degraded by mild motion.  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 Large hemorrhagic lesion in the right parietal lobe measures 19 x 30 x 30 mm with surrounding white  matter edema. Adjacent to this is a chronic treated lesion which is stable. New lesions are present in the left parietal lobe, right parietal lobe, left basal ganglia, right posterior lateral basal ganglia, right temporoparietal lobe, and right cerebellum. These lesions all measure under 5 mm aside from the large lesion in the right parietal lobe.  Several small lesions seen on prior studies have been treated and are difficult to see due to interval improvement as well as motion.  4 mm enhancing lesion in the spinal cord at the C3 level consistent with metastatic disease. This is not seen on the MRI of 12/17/2013.  Ventricle size is normal. Mild mass effect on the right atrium from the large lesion in the right parietal lobe. No shift of the midline structures.  Negative for acute ischemic infarction.  IMPRESSION: New hemorrhagic lesion in the right parietal lobe measuring 19 x 30 x 30 mm. This shows evidence of mild hemorrhage and has moderate surrounding edema. This is adjacent to a chronic treated metastatic deposit which is stable.  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 Small lesion in the spinal cord at the C3 level consistent with metastatic disease.   Electronically Signed   By: Franchot Gallo M.D.   On: 03/09/2014 16:53   Dg Chest Port 1 View  03/08/2014   CLINICAL DATA:  Weakness and fever, history of lung carcinoma  EXAM: PORTABLE CHEST - 1 VIEW  COMPARISON:  12/22/2013  FINDINGS: Cardiac shadow is stable from the prior exam. A new right-sided pleural effusion and likely right basilar infiltrate is noted. Slightly more prominent pulmonary nodules are noted bilaterally consistent with the patient's given clinical history.  IMPRESSION: Increase in the degree of pulmonary nodularity when compare with the prior exam.  New right basilar effusion with likely underlying infiltrate.   Electronically Signed   By: Inez Catalina M.D.   On: 03/08/2014 17:51     Assessment/Plan: 38 year old male  with metastatic non-small cell carcinoma of the lung.  Presents with what by description appears to be simple partial seizures.  MRI of the brain personally reviewed and shows new hemorrhagic right parietal lobe lesion along with other new metastatic lesions.  It is likely that the new right parietal lobe lesion is the etiology of the worsening left sided weakness and the development of seizure activity.  Can not rule out that there may be some component of post-ictal effect exacerbating weakness as well.  Patient on Decadron but will likely benefit from anticonvulsant therapy as well.    Recommendations: 1.  Keppra. 1000mg  IV now with maintenance of 500mg  PO BID. 2.  Patient unable to drive, operate heavy machinery, perform activities at heights and participate in water activities until release by outpatient physician. 3.  Ativan prn 4.  Seizure precautions  Alexis Goodell, MD [facility] [phone] 03/10/2014, 1:37 PM

## 2014-03-10 NOTE — H&P (Deleted)
[facility] History and Physical  Hulbert Branscome ZTI:[id number] DOB: 17-Sep-1975 DOA: 03/09/2014  Referring physician: ED physician PCP: No PCP Per Patient    Hospital course: 38 y.o. 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 SDU requested after ED doctor spoke with PCCM on call. Per review of records, pt apparently recently diagnosed with blood clots and was started on Fondapurinix injection.  He tried to leave AMA again soon after admission and was committed (IVC) until his medical issues can be resolved.   Subjective: States he is too sleepy to speak with me or to contribute to exam. Girlfriend at bedside giving history. Patient follows with oncology in [place] and plans to return there after current medical issues are resolved. She states that he usually has left-sided weakness but it is much worse now than it has been before. In addition she noted shaking of his left leg which progressed upward towards his left arm and head. Per RN he is refusing to take his oral medications  Assessment and Plan: Active Problems: Seizure activity - possibly from metastatic disease-I have consult to neurology-currently not on antiepileptics - MRI brain reveals multiple metastasis with hemorrhage - started pt on Decadron IV  - paged neurosurgery on call to review images  - monitor closely in SDU   NSCLC with metastatic disease -  d/w Dr. Julien Nordmann who states that he discharged him from his practice and is no longer seeing him. 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 as outlined above - advance diet as pt able to tolerate   Hx of blood clots - will hold anticoagulation for now until MRI brain back as CT head notable for hemorrhagic mets in the right parietal lobe  - SCD's for DVT prophylaxis    Code Status: Full Family Communication: Pt and girlfriend at bedside   Physical Exam: Filed Vitals:   03/10/14 1220 03/10/14 1230 03/10/14 1240 03/10/14 1250  BP:      Pulse: 89 89 83 108  Temp:      TempSrc:      Resp: 23 23 25  36  Height:      Weight:      SpO2: 100% 100% 100% 96%    Physical Exam  Constitutional: Appears frail and weak CVS: RRR, S1/S2 +, no murmurs, no gallops, no carotid bruit.  Pulmonary: Effort and breath sounds normal, no stridor, rhonchi at bases  Abdominal: Soft. BS +,  no distension, tenderness, rebound or guarding.  Neuro: Alert. Poor cooperation with exam   Basic Metabolic Panel:  Recent Labs Lab 03/08/14 1840 03/10/14 0340  NA 136 134*  K 4.0 4.6  CL 103 103  CO2 24 22  GLUCOSE 75 103*  BUN 8 14  CREATININE 0.61 0.73  CALCIUM 8.7 8.4   CBC:  Recent Labs Lab 03/08/14 1840 03/10/14 0340  WBC 4.8 3.5*  HGB 11.9* 12.8*  HCT 37.0* 40.0  MCV 85.1 84.0  PLT 234 276    Margues Filippini, MD  [facility] [email or web address] Password [email or web address] 03/10/2014, 1:44 PM

## 2014-03-10 NOTE — H&P (Deleted)
[facility] History and Physical  Beatriz Settles KPT:[id number] DOB: [birth year] DOA: 03/09/2014  Referring physician: ED physician PCP: No PCP Per Patient    Hospital course: 38 y.o. 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 SDU requested after ED doctor spoke with PCCM on call. Per review of records, pt apparently recently diagnosed with blood clots and was started on Fondapurinix injection.  He tried to leave AMA again soon after admission and was committed (IVC) until his medical issues can be resolved.   Subjective: States he is too sleepy to speak with me or to contribute to exam. Girlfriend at bedside giving history. Patient follows with oncology in [place] and plans to return there after current medical issues are resolved. She states that he usually has left-sided weakness but it is much worse now than it has been before. In addition she noted shaking of his left leg which progressed upward towards his left arm and head. Per RN he is refusing to take his oral medications  Assessment and Plan: Active Problems: Seizure activity - possibly from metastatic disease-I have consult to neurology-currently not on antiepileptics - MRI brain reveals multiple metastasis with hemorrhage - started pt on Decadron IV  - paged neurosurgery on call to review images  - monitor closely in SDU   NSCLC with metastatic disease -  d/w Dr. Julien Nordmann who states that he discharged him from his practice and is no longer seeing him. 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 as outlined above - advance diet as pt able to tolerate   Hx of blood clots - will hold anticoagulation for now until MRI brain back as CT head notable for hemorrhagic mets in the right parietal lobe  - SCD's for DVT prophylaxis    Code Status: Full Family Communication: Pt and girlfriend at bedside   Physical Exam: Filed Vitals:   03/10/14 1220 03/10/14 1230 03/10/14 1240 03/10/14 1250  BP:      Pulse: 89 89 83 108  Temp:      TempSrc:      Resp: 23 23 25  36  Height:      Weight:      SpO2: 100% 100% 100% 96%    Physical Exam  Constitutional: Appears frail and weak CVS: RRR, S1/S2 +, no murmurs, no gallops, no carotid bruit.  Pulmonary: Effort and breath sounds normal, no stridor, rhonchi at bases  Abdominal: Soft. BS +,  no distension, tenderness, rebound or guarding.  Neuro: Alert. Poor cooperation with exam   Basic Metabolic Panel:  Recent Labs Lab 03/08/14 1840 03/10/14 0340  NA 136 134*  K 4.0 4.6  CL 103 103  CO2 24 22  GLUCOSE 75 103*  BUN 8 14  CREATININE 0.61 0.73  CALCIUM 8.7 8.4   CBC:  Recent Labs Lab 03/08/14 1840 03/10/14 0340  WBC 4.8 3.5*  HGB 11.9* 12.8*  HCT 37.0* 40.0  MCV 85.1 84.0  PLT 234 276    Magdaline Zollars, MD  [facility] [email or web address] Password [email or web address] 03/10/2014, 1:36 PM

## 2014-03-10 NOTE — Progress Notes (Addendum)
[facility] Progress note    Hospital course: 38 y.o. 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 SDU requested after ED doctor spoke with PCCM on call. Per review of records, pt apparently recently diagnosed with blood clots and was started on Fondapurinix injection.  He tried to leave AMA again soon after admission and was committed (IVC) until his medical issues can be resolved.   Subjective: States he is too sleepy to speak with me or to contribute to exam. Girlfriend at bedside giving history. Patient follows with oncology in [place] and plans to return there after current medical issues are resolved. She states that he usually has left-sided weakness but it is much worse now than it has been before. In addition she noted shaking of his left leg which progressed upward towards his left arm and head. Per RN he is refusing to take his oral medications  Assessment and Plan: Active Problems: Seizure activity-brain metastasis with hemorrhage - possibly from metastatic disease-I have consult to neurology-currently not on antiepileptics - MRI brain reveals multiple metastasis with hemorrhage - started pt on Decadron IV  - monitor closely in SDU   NSCLC with metastatic disease - d/w Dr. Julien Nordmann who states that he discharged him from his practice and is no longer seeing him. 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 as outlined above - advance diet as pt able to tolerate   Hx of blood clots - will hold anticoagulation for now as MRI notable for hemorrhagic mets in the right parietal lobe  - SCD's for DVT prophylaxis    Code Status: Full Family Communication: Pt and girlfriend at bedside   Physical Exam: Filed Vitals:   03/10/14 1220 03/10/14 1230 03/10/14 1240 03/10/14 1250  BP:      Pulse: 89 89 83 108  Temp:      TempSrc:      Resp: 23 23 25  36  Height:      Weight:      SpO2: 100% 100% 100% 96%    Physical Exam  Constitutional: Appears frail and weak CVS: RRR, S1/S2 +, no murmurs, no gallops, no carotid bruit.  Pulmonary: Effort and breath sounds normal, no stridor, rhonchi at bases  Abdominal: Soft. BS +, no distension, tenderness, rebound or guarding.  Neuro: Alert. Poor cooperation with exam   Basic Metabolic Panel:  Last Labs      Recent Labs Lab 03/08/14 1840 03/10/14 0340  NA 136 134*  K 4.0 4.6  CL 103 103  CO2 24 22  GLUCOSE 75 103*  BUN 8 14  CREATININE 0.61 0.73  CALCIUM 8.7 8.4     CBC:  Last Labs      Recent Labs Lab 03/08/14 1840 03/10/14 0340  WBC 4.8 3.5*  HGB 11.9* 12.8*  HCT 37.0* 40.0  MCV 85.1 84.0  PLT 234 276      [place], MD [facility] [email or web address] Password [email or web address] 03/10/2014, 1:44 PM

## 2014-03-11 DIAGNOSIS — I8290 Acute embolism and thrombosis of unspecified vein: Secondary | ICD-10-CM

## 2014-03-11 DIAGNOSIS — J9601 Acute respiratory failure with hypoxia: Secondary | ICD-10-CM

## 2014-03-11 DIAGNOSIS — C7931 Secondary malignant neoplasm of brain: Principal | ICD-10-CM

## 2014-03-11 DIAGNOSIS — J90 Pleural effusion, not elsewhere classified: Secondary | ICD-10-CM

## 2014-03-11 MED ORDER — DEXAMETHASONE 4 MG PO TABS: 4.0000 mg | ORAL_TABLET | Freq: Three times a day (TID) | ORAL | Status: DC

## 2014-03-11 MED ORDER — LEVETIRACETAM 500 MG PO TABS: 500.0000 mg | ORAL_TABLET | Freq: Two times a day (BID) | ORAL | Status: DC

## 2014-03-11 MED ORDER — FONDAPARINUX SODIUM 7.5 MG/0.6ML ~~LOC~~ SOLN
7.5000 mg | SUBCUTANEOUS | Status: DC
  Administered 2014-03-11: 7.5 mg via SUBCUTANEOUS
  Filled 2014-03-11: qty 0.6

## 2014-03-11 NOTE — Progress Notes (Signed)
The patient has widely metastatic carcinoma with known multiple metastases to the brain  With radiation therapy.  He recently presented with a seizure.  Workup demonstrated small amount hemorrhage involving his right posterior frontal convexity metastasis.  There is minimal mass effect from this hemorrhage and not something that would benefit from surgical intervention.  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  He requires anticoagulation for treatment of this.  Although it is certainly not ideal I think it is appropriate and indicated to resume the patient's anticoagulation.  Certainly this may cause or worsen bleeding within his brain but I see no other option.

## 2014-03-11 NOTE — Care Management Note (Addendum)
    Page 1 of 2   03/12/2014     11:42:42 AM CARE MANAGEMENT NOTE 03/12/2014  Patient:  [facility]   Account Number:  [id number]  Date Initiated:  03/11/2014  Documentation initiated by:  [facility]  Subjective/Objective Assessment:   adm: seizures and weakness     Action/Plan:   discharge planning   Anticipated DC Date:  03/11/2014   Anticipated DC Plan:  [place]  CM consult      [facility] Choice  HOME HEALTH   Choice offered to / List presented to:  C-1 Patient   DME arranged  3-N-1  [place]      DME agency  [place] arranged  HH-1 RN  [place].   Status of service:  Completed, signed off Medicare Important Message given?   (If response is "NO", the following Medicare IM given date fields will be blank) Date Medicare IM given:   Medicare IM given by:   Date Additional Medicare IM given:   Additional Medicare IM given by:    Discharge Disposition:  [place]  Per UR Regulation:    If discussed at Long Length of Stay Meetings, dates discussed:    Comments:  03/12/14 11:30 CM received call from [facility] bc they have no PCP for [facility] orders.  CM called pt and spoke with caretaker/girlfriend [name] who states unfortunately pt does not have an in-town PCP and is treated by Christa See, MD from [place].  [name] also states, they will be leaving for [place] on Monday and request [facility] drop off the cushion for the wheelchair.  [name] verbalizes understanding [facility] will not be able to render services but states since they are leaving on Monday, this is fine.  CM called [facility] DME rep, Pura Spice who states she will have cushion dropped to the house of pt.  No other CM needs were communicated.  Mariane Masters, BSN, [place].  03/11/14  10:00 CM met with pt in room to offer choice of home health agency.  Pt chooses [facility]  to render HHPT/RN/Aide. Address and contact informaiton verified with pt and primary contact is his girlfriend/caretaker [name] [phone].  CM called referral to [facility] rep, Kristen.  CM called [facility] DME rep, Lecretia to please deliver the oxygen and rolling walker to room prior to discharge and the wheelchair, cushion, and 3n1 to the home address today.  No other CM needs were communicated.  Mariane Masters, BSN, CM [phone].

## 2014-03-11 NOTE — Progress Notes (Signed)
Subjective: Patient reports improvement in left arm jerking with the start of the [place].  He has not noted any side effects.    Objective: Current vital signs: BP 128/90 mmHg  Pulse 107  Temp(Src) 97 F (36.1 C) (Axillary)  Resp 17  Ht 5\' 11"  (1.803 m)  Wt 52.5 kg (115 lb 11.9 oz)  BMI 16.15 kg/m2  SpO2 97% Vital signs in last 24 hours: Temp:  [97 F (36.1 C)-98.7 F (37.1 C)] 97 F (36.1 C) (12/30 0800) Pulse Rate:  [62-132] 107 (12/30 1110) Resp:  [13-36] 17 (12/30 1110) BP: (121-159)/(83-114) 128/90 mmHg (12/30 1110) SpO2:  [87 %-99 %] 97 % (12/30 1110) Weight:  [52.5 kg (115 lb 11.9 oz)] 52.5 kg (115 lb 11.9 oz) (12/30 0500)  Intake/Output from previous day: 12/29 0701 - 12/30 0700 In: 2449 [P.O.:720; I.V.:1729] Out: 1675 [Urine:1675] Intake/Output this shift: Total I/O In: 780 [P.O.:480; I.V.:300] Out: 575 [Urine:575] Nutritional status: Diet regular Diet - low sodium heart healthy  Neurologic Exam: Mental Status: Alert, oriented, thought content appropriate. Flat affect. Speech fluent without evidence of aphasia. Able to follow 3 step commands without difficulty. Cranial Nerves: II: Discs flat bilaterally; Visual fields grossly normal, pupils equal, round, reactive to light and accommodation III,IV, VI: ptosis not present, extra-ocular motions intact bilaterally V,VII: smile symmetric, facial light touch sensation normal bilaterally VIII: hearing normal bilaterally IX,X: gag reflex present XI: bilateral shoulder shrug XII: midline tongue extension Motor: Right :Upper extremity 5/5Left: Upper extremity 4-/5 Lower extremity 5/5Lower extremity 4-/5 Tone and bulk:normal tone throughout; no atrophy noted Sensory: Pinprick and light touch decreased in the left upper and lower extremity. Deep Tendon Reflexes: 2+ and symmetric  throughout Plantars: Right: downgoingLeft: downgoing   Lab Results: Basic Metabolic Panel:  Recent Labs Lab 03/08/14 1840 03/10/14 0340  NA 136 134*  K 4.0 4.6  CL 103 103  CO2 24 22  GLUCOSE 75 103*  BUN 8 14  CREATININE 0.61 0.73  CALCIUM 8.7 8.4    Liver Function Tests: No results for input(s): AST, ALT, ALKPHOS, BILITOT, PROT, ALBUMIN in the last 168 hours. No results for input(s): LIPASE, AMYLASE in the last 168 hours. No results for input(s): AMMONIA in the last 168 hours.  CBC:  Recent Labs Lab 03/08/14 1840 03/10/14 0340  WBC 4.8 3.5*  HGB 11.9* 12.8*  HCT 37.0* 40.0  MCV 85.1 84.0  PLT 234 276    Cardiac Enzymes: No results for input(s): CKTOTAL, CKMB, CKMBINDEX, TROPONINI in the last 168 hours.  Lipid Panel: No results for input(s): CHOL, TRIG, HDL, CHOLHDL, VLDL, LDLCALC in the last 168 hours.  CBG: No results for input(s): GLUCAP in the last 168 hours.  Microbiology: Results for orders placed or performed during the hospital encounter of 03/09/14  MRSA PCR Screening     Status: None   Collection Time: 03/09/14 11:05 AM  Result Value Ref Range Status   MRSA by PCR NEGATIVE NEGATIVE Final    Comment:        The GeneXpert MRSA Assay (FDA approved for NASAL specimens only), is one component of a comprehensive MRSA colonization surveillance program. It is not intended to diagnose MRSA infection nor to guide or monitor treatment for MRSA infections.     Coagulation Studies: No results for input(s): LABPROT, INR in the last 72 hours.  Imaging: Mr Jeri Cos ZL Contrast  03/09/2014   CLINICAL DATA:  Metastatic lung cancer.  Seizure.  Abnormal CT  EXAM: MRI HEAD WITHOUT AND WITH  CONTRAST  TECHNIQUE: Multiplanar, multiecho pulse sequences of the brain and surrounding structures were obtained without and with intravenous contrast.  CONTRAST:  99mL MULTIHANCE GADOBENATE DIMEGLUMINE 529 MG/ML IV SOLN  COMPARISON:   CT head 03/08/2014, MRI 08/15/2013, 07/04/2013  FINDINGS: Image quality degraded by mild motion.  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 Large hemorrhagic lesion in the right parietal lobe measures 19 x 30 x 30 mm with surrounding white matter edema. Adjacent to this is a chronic treated lesion which is stable. New lesions are present in the left parietal lobe, right parietal lobe, left basal ganglia, right posterior lateral basal ganglia, right temporoparietal lobe, and right cerebellum. These lesions all measure under 5 mm aside from the large lesion in the right parietal lobe. Several small lesions seen on prior studies have been treated and are difficult to see due to interval improvement as well as motion.  4 mm enhancing lesion in the spinal cord at the C3 level consistent with metastatic disease. This is not seen on the MRI of 12/17/2013.  Ventricle size is normal. Mild mass effect on the right atrium from the large lesion in the right parietal lobe. No shift of the midline structures.  Negative for acute ischemic infarction.  IMPRESSION: New hemorrhagic lesion in the right parietal lobe measuring 19 x 30 x 30 mm. This shows evidence of mild hemorrhage and has moderate surrounding edema. This is adjacent to a chronic treated metastatic deposit which is stable.  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 Small lesion in the spinal cord at the C3 level consistent with metastatic disease.   Electronically Signed   By: Franchot Gallo M.D.   On: 03/09/2014 16:53    Medications:  I have reviewed the patient's current medications. Scheduled: . dexamethasone  4 mg Intravenous 4 times per day  . docusate sodium  100 mg Oral Q12H  . fondaparinux  7.5 mg Subcutaneous Q24H  . gabapentin  300 mg Oral Q8H  . levETIRAcetam  500 mg Oral BID  . metoCLOPramide  10 mg Oral QID  . metoprolol  2.5 mg Intravenous 4 times per day  . nicotine  21 mg Transdermal Daily  . pantoprazole  40 mg  Oral Daily  . potassium chloride SA  20 mEq Oral Daily  . sodium chloride  3 mL Intravenous Q12H    Assessment/Plan: No further focal jerking noted.  Patient on Keppra and tolerating well.    Recommendations: 1. Continue Keppra at current dose 2.  If further worsening would repeat imaging to determine if worsening in hemorrhage 3.  Continue seizure precautions   LOS: 2 days   Alexis Goodell, MD [facility] [phone] 03/11/2014  1:27 PM

## 2014-03-11 NOTE — Progress Notes (Signed)
Patient d/c'd home with family members and girlfriend, [name]. [facility] delivered oxygen and walker to patient's room to go home with patient.

## 2014-03-11 NOTE — Discharge Summary (Signed)
Physician Discharge Summary  Terrance Clark YKZ:[id number] DOB: 12/01/1975 DOA: 03/09/2014  PCP: No PCP Per Patient  Admit date: 03/09/2014 Discharge date: 03/11/2014  Time spent: 55 minutes  Recommendations for Outpatient Follow-up:  1. Taper steroids   2. F/u on O2 requirements and pleural effusions  Discharge Condition: stable Diet recommendation: regular diet  Discharge Diagnoses:  Principal Problem:   Seizures Active Problems:   Non-small cell carcinoma of lung   Multiple brain metastases   Protein-calorie malnutrition, severe   Thrombosis- extensive   Acute respiratory failure with hypoxia   Pleural effusions- bilateral (malignant?)   History of present illness:  38 y.o. male with non-small cell carcinoma of the lung with extensive metastatic lesions including brain metastases status post recent completion of radiation treatment, getting chemotherapy in [place] who presented 03/08/2014 with weakness but left AMA for unknown reason and came back due to worsening weakness, jerking movement starting in the left leg and progressing up to arm and neck. SDU requested after ED doctor spoke with PCCM on call. Per review of records, pt apparently recently diagnosed with blood clots and was started on Fondapurinix injection.  He tried to leave AMA again soon after admission and was committed (IVC) until his medical issues can be resolved.   Hospital Course:  Active Problems: Seizure activity-brain metastasis with hemorrhage - likely from metastatic disease-- MRI brain reveals multiple metastasis with hemorrhage- see report below - Neuro consulted- loaded with Keppra 1 gm and now on 500 BID - started pt on Decadron IVand now transitioning to oral  Hx of "blood clots" - I spoke with his oncologist Dr Christa See in [place] who tells me that imagining on 01/12/14 revealed b/l brachiocephalic vein thrombus, R IJ thrombus, SVC thrombus and possibly a R subclavian thrombus -  discussed MRI which revealed hemorrhagic metastasis with neurosurgery ( Dr Trenton Gammon and Dr Arnoldo Morale)- hemorrhage is mild- in context of extensive thrombus, will need to resume Arixtra- d/w patient who states the injections cause panic attack but is in agreement to resume  NSCLC with metastatic disease -d/w Dr. Julien Nordmann who states that he discharged him from his practice and is no longer seeing him. -He will be returning to [place] for further management- he has an appt with Dr Christa See in 1 wk  Weakness in left upper and lower extremities - mostly on the left side and now resolved- possibly post ictal - will continue to use walker at home  Hypoxic resp failure - secondary to mod right pl effusion, small left pl effusion (likely malignant) and b/l atelectasis - no need to perform thoracentesis yet - will discharge home with O2  Severe Protein calorie malnutrition - in the context of chronic illness as outlined above - advance diet as pt able to tolerate   Consultations:  Phone consult with neurosurgery  Discharge Exam: Filed Weights   03/09/14 1130 03/10/14 0500 03/11/14 0500  Weight: 51.2 kg (112 lb 14 oz) 52.8 kg (116 lb 6.5 oz) 52.5 kg (115 lb 11.9 oz)   Filed Vitals:   03/11/14 1100  BP:   Pulse: 115  Temp:   Resp: 32    General: AAO x 3, no distress Cardiovascular: RRR, no murmurs  Respiratory: clear to auscultation bilaterally GI: soft, non-tender, non-distended, bowel sound positive Neuro: CN 2-12 intact, strength intact in all extermities  Discharge Instructions You were cared for by a hospitalist during your hospital stay. If you have any questions about your discharge medications or the care you received while you  were in the hospital after you are discharged, you can call the unit and asked to speak with the hospitalist on call if the hospitalist that took care of you is not available. Once you are discharged, your primary care physician will handle any further  medical issues. Please note that NO REFILLS for any discharge medications will be authorized once you are discharged, as it is imperative that you return to your primary care physician (or establish a relationship with a primary care physician if you do not have one) for your aftercare needs so that they can reassess your need for medications and monitor your lab values.      Discharge Instructions    Diet - low sodium heart healthy    Complete by:  As directed      Increase activity slowly    Complete by:  As directed             Medication List    TAKE these medications        albuterol 108 (90 BASE) MCG/ACT inhaler  Commonly known as:  PROVENTIL HFA;VENTOLIN HFA  Inhale 2 puffs into the lungs every 6 (six) hours as needed for wheezing or shortness of breath.     ARIXTRA 7.5 MG/0.6ML Soln injection  Generic drug:  fondaparinux  Inject 7.5 mg into the skin daily.     cyclobenzaprine 10 MG tablet  Commonly known as:  FLEXERIL  Take 1 tablet (10 mg total) by mouth 2 (two) times daily as needed for muscle spasms.     dexamethasone 4 MG tablet  Commonly known as:  DECADRON  Take 1 tablet (4 mg total) by mouth 3 (three) times daily.     docusate sodium 100 MG capsule  Commonly known as:  COLACE  Take 1 capsule (100 mg total) by mouth every 12 (twelve) hours.     fentaNYL 75 MCG/HR  Commonly known as:  DURAGESIC - dosed mcg/hr  Place 150 mcg onto the skin every 3 (three) days.     gabapentin 300 MG capsule  Commonly known as:  NEURONTIN  Take 300 mg by mouth every 8 (eight) hours.     HYDROmorphone 4 MG tablet  Commonly known as:  DILAUDID  Take 4 mg by mouth every 4 (four) hours as needed for severe pain.     levETIRAcetam 500 MG tablet  Commonly known as:  KEPPRA  Take 1 tablet (500 mg total) by mouth 2 (two) times daily.     metoCLOPramide 10 MG tablet  Commonly known as:  REGLAN  Take 10 mg by mouth 4 (four) times daily.     morphine 30 MG 12 hr tablet   Commonly known as:  MS CONTIN  Take 1 tablet (30 mg total) by mouth every 12 (twelve) hours.     ondansetron 4 MG disintegrating tablet  Commonly known as:  ZOFRAN-ODT  Take 4 mg by mouth every 8 (eight) hours as needed for nausea or vomiting.     oxyCODONE 5 MG immediate release tablet  Commonly known as:  Oxy IR/ROXICODONE  Take 1-3 tablets (5-15 mg total) by mouth every 4 (four) hours as needed for severe pain.     oxyCODONE-acetaminophen 10-325 MG per tablet  Commonly known as:  PERCOCET  Take 1 tablet by mouth every 4 (four) hours as needed for pain.     pantoprazole 40 MG tablet  Commonly known as:  PROTONIX  Take 40 mg by mouth daily.  potassium chloride SA 20 MEQ tablet  Commonly known as:  K-DUR,KLOR-CON  Take 20 mEq by mouth daily.     PRESCRIPTION MEDICATION  Take 3 tablets by mouth 3 (three) times daily. Symphytum Officinale 30C Homeopathic-- Dissolve under tongue. Rise mouth with water before taking.       No Known Allergies    The results of significant diagnostics from this hospitalization (including imaging, microbiology, ancillary and laboratory) are listed below for reference.    Significant Diagnostic Studies: Dg Chest 2 View  03/09/2014   CLINICAL DATA:  Lung cancer. Shortness of breath, poor oral intake, nausea.  EXAM: CHEST  2 VIEW  COMPARISON:  03/08/2014  FINDINGS: Stable moderate right pleural effusion with right lower lobe opacity, likely atelectasis. Bilateral pulmonary nodularity again noted. Heart is normal size. Small left pleural effusion noted. No acute bony abnormality.  IMPRESSION: Stable exam since earlier today. Moderate right pleural effusion and small left pleural effusion. Right lower lobe atelectasis. Bilateral pulmonary nodules.   Electronically Signed   By: Rolm Baptise M.D.   On: 03/09/2014 01:19   Ct Head Wo Contrast   (if New Onset Seizure And/or Head Trauma)  03/08/2014   CLINICAL DATA:  Metastatic lung cancer, sudden onset  of seizure today.  EXAM: CT HEAD WITHOUT CONTRAST  TECHNIQUE: Contiguous axial images were obtained from the base of the skull through the vertex without intravenous contrast.  COMPARISON:  Brain MRI 08/15/2013  FINDINGS: No midline shift is noted. No destructive bony lesion. No acute fractures. Paranasal sinuses and mastoid air cells are unremarkable.  In axial image 22 there is high density probable hemorrhagic metastasis in right parietal lobe with surrounding vasogenic edema. The lesion measures about 1.2 cm. There is no evidence of active surrounding hemorrhage or intraventricular hemorrhage.  In axial image 28 there is serpiginous high density material in right parietal lobe high convexity with gyral disposition measures about 75 Hounsfield units in attenuation. 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 Further correlation with MRI is recommended.  IMPRESSION: In axial image 22 there is high density probable hemorrhagic metastasis in right parietal lobe with surrounding vasogenic edema. The lesion measures about 1.2 cm. There is no evidence of active surrounding hemorrhage or intraventricular hemorrhage.  In axial image 28 there is serpiginous high density material in right parietal lobe high convexity with gyral disposition measures about 75 Hounsfield units in attenuation. 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 Further correlation with MRI is recommended.  These results were called by telephone at the time of interpretation on 03/08/2014 at 6:14 pm to Dr. Dorie Rank , who verbally acknowledged these results.   Electronically Signed   By: Lahoma Crocker M.D.   On: 03/08/2014 18:15   Mr Jeri Cos VO Contrast  03/09/2014   CLINICAL DATA:  Metastatic lung cancer.  Seizure.  Abnormal CT  EXAM: MRI HEAD  WITHOUT AND WITH CONTRAST  TECHNIQUE: Multiplanar, multiecho pulse sequences of the brain and surrounding structures were obtained without and with intravenous contrast.  CONTRAST:  37mL MULTIHANCE GADOBENATE DIMEGLUMINE 529 MG/ML IV SOLN  COMPARISON:  CT head 03/08/2014, MRI 08/15/2013, 07/04/2013  FINDINGS: Image quality degraded by mild motion.  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 Large hemorrhagic lesion in the right parietal lobe measures 19 x 30 x 30 mm with surrounding white  matter edema. Adjacent to this is a chronic treated lesion which is stable. New lesions are present in the left parietal lobe, right parietal lobe, left basal ganglia, right posterior lateral basal ganglia, right temporoparietal lobe, and right cerebellum. These lesions all measure under 5 mm aside from the large lesion in the right parietal lobe. Several small lesions seen on prior studies have been treated and are difficult to see due to interval improvement as well as motion.  4 mm enhancing lesion in the spinal cord at the C3 level consistent with metastatic disease. This is not seen on the MRI of 12/17/2013.  Ventricle size is normal. Mild mass effect on the right atrium from the large lesion in the right parietal lobe. No shift of the midline structures.  Negative for acute ischemic infarction.  IMPRESSION: New hemorrhagic lesion in the right parietal lobe measuring 19 x 30 x 30 mm. This shows evidence of mild hemorrhage and has moderate surrounding edema. This is adjacent to a chronic treated metastatic deposit which is stable.  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 Small lesion in the spinal cord at the C3 level consistent with metastatic disease.   Electronically Signed   By: Franchot Gallo M.D.   On: 03/09/2014 16:53   Dg Chest Port 1 View  03/08/2014   CLINICAL DATA:  Weakness and fever, history of lung carcinoma  EXAM: PORTABLE CHEST - 1 VIEW  COMPARISON:  12/22/2013  FINDINGS: Cardiac  shadow is stable from the prior exam. A new right-sided pleural effusion and likely right basilar infiltrate is noted. Slightly more prominent pulmonary nodules are noted bilaterally consistent with the patient's given clinical history.  IMPRESSION: Increase in the degree of pulmonary nodularity when compare with the prior exam.  New right basilar effusion with likely underlying infiltrate.   Electronically Signed   By: Inez Catalina M.D.   On: 03/08/2014 17:51    Microbiology: Recent Results (from the past 240 hour(s))  MRSA PCR Screening     Status: None   Collection Time: 03/09/14 11:05 AM  Result Value Ref Range Status   MRSA by PCR NEGATIVE NEGATIVE Final    Comment:        The GeneXpert MRSA Assay (FDA approved for NASAL specimens only), is one component of a comprehensive MRSA colonization surveillance program. It is not intended to diagnose MRSA infection nor to guide or monitor treatment for MRSA infections.      Labs: Basic Metabolic Panel:  Recent Labs Lab 03/08/14 1840 03/10/14 0340  NA 136 134*  K 4.0 4.6  CL 103 103  CO2 24 22  GLUCOSE 75 103*  BUN 8 14  CREATININE 0.61 0.73  CALCIUM 8.7 8.4   Liver Function Tests: No results for input(s): AST, ALT, ALKPHOS, BILITOT, PROT, ALBUMIN in the last 168 hours. No results for input(s): LIPASE, AMYLASE in the last 168 hours. No results for input(s): AMMONIA in the last 168 hours. CBC:  Recent Labs Lab 03/08/14 1840 03/10/14 0340  WBC 4.8 3.5*  HGB 11.9* 12.8*  HCT 37.0* 40.0  MCV 85.1 84.0  PLT 234 276   Cardiac Enzymes: No results for input(s): CKTOTAL, CKMB, CKMBINDEX, TROPONINI in the last 168 hours. BNP: BNP (last 3 results) No results for input(s): PROBNP in the last 8760 hours. CBG: No results for input(s): GLUCAP in the last 168 hours.     SignedDebbe Odea, MD [facility] 03/11/2014, 11:31 AM

## 2014-03-11 NOTE — Progress Notes (Signed)
Pt ambulated ~30 feet with 2 assist and walker. Very weak and very dependent on staff. HR 140s and sats 88% during ambulation.

## 2014-03-11 NOTE — Progress Notes (Signed)
SATURATION QUALIFICATIONS: (This note is used to comply with regulatory documentation for home oxygen)  Patient Saturations on Room Air at Rest = 94%  Patient Saturations on Room Air while Ambulating = 87%  Patient Saturations on 2 Liters of oxygen while Ambulating = 98%  Please briefly explain why patient needs home oxygen: non small cell carcinoma of lung

## 2014-03-12 ENCOUNTER — Telehealth: Payer: Self-pay | Admitting: Internal Medicine

## 2014-03-12 NOTE — Progress Notes (Signed)
Discharge summary sent to payer through [facility]  

## 2014-03-12 NOTE — Telephone Encounter (Signed)
MEDICAL RECORDS FAXED TO [facility] TO [phone] PAGES.

## 2014-04-02 ENCOUNTER — Emergency Department (HOSPITAL_COMMUNITY): Payer: 59

## 2014-04-02 ENCOUNTER — Inpatient Hospital Stay (HOSPITAL_COMMUNITY): Payer: 59

## 2014-04-02 ENCOUNTER — Inpatient Hospital Stay (HOSPITAL_COMMUNITY)
Admission: EM | Admit: 2014-04-02 | Discharge: 2014-04-03 | DRG: 435 | Disposition: A | Discharge status: Home / Self Care | Payer: 59 | Attending: Internal Medicine | Admitting: Internal Medicine

## 2014-04-02 ENCOUNTER — Encounter (HOSPITAL_COMMUNITY): Payer: Self-pay | Admitting: Nurse Practitioner

## 2014-04-02 DIAGNOSIS — Z86718 Personal history of other venous thrombosis and embolism: Secondary | ICD-10-CM

## 2014-04-02 DIAGNOSIS — Z681 Body mass index (BMI) 19 or less, adult: Secondary | ICD-10-CM

## 2014-04-02 DIAGNOSIS — G893 Neoplasm related pain (acute) (chronic): Secondary | ICD-10-CM | POA: Diagnosis present

## 2014-04-02 DIAGNOSIS — C7970 Secondary malignant neoplasm of unspecified adrenal gland: Secondary | ICD-10-CM | POA: Diagnosis present

## 2014-04-02 DIAGNOSIS — R1084 Generalized abdominal pain: Secondary | ICD-10-CM | POA: Diagnosis not present

## 2014-04-02 DIAGNOSIS — Z923 Personal history of irradiation: Secondary | ICD-10-CM

## 2014-04-02 DIAGNOSIS — Z7901 Long term (current) use of anticoagulants: Secondary | ICD-10-CM | POA: Diagnosis not present

## 2014-04-02 DIAGNOSIS — I8290 Acute embolism and thrombosis of unspecified vein: Secondary | ICD-10-CM | POA: Diagnosis present

## 2014-04-02 DIAGNOSIS — C799 Secondary malignant neoplasm of unspecified site: Secondary | ICD-10-CM | POA: Diagnosis present

## 2014-04-02 DIAGNOSIS — R627 Adult failure to thrive: Secondary | ICD-10-CM | POA: Insufficient documentation

## 2014-04-02 DIAGNOSIS — R52 Pain, unspecified: Secondary | ICD-10-CM

## 2014-04-02 DIAGNOSIS — C7951 Secondary malignant neoplasm of bone: Secondary | ICD-10-CM | POA: Diagnosis present

## 2014-04-02 DIAGNOSIS — E43 Unspecified severe protein-calorie malnutrition: Secondary | ICD-10-CM | POA: Diagnosis present

## 2014-04-02 DIAGNOSIS — Z79891 Long term (current) use of opiate analgesic: Secondary | ICD-10-CM | POA: Diagnosis not present

## 2014-04-02 DIAGNOSIS — E871 Hypo-osmolality and hyponatremia: Secondary | ICD-10-CM | POA: Diagnosis present

## 2014-04-02 DIAGNOSIS — C7931 Secondary malignant neoplasm of brain: Secondary | ICD-10-CM | POA: Diagnosis present

## 2014-04-02 DIAGNOSIS — R109 Unspecified abdominal pain: Secondary | ICD-10-CM | POA: Diagnosis not present

## 2014-04-02 DIAGNOSIS — R188 Other ascites: Secondary | ICD-10-CM | POA: Diagnosis present

## 2014-04-02 DIAGNOSIS — I313 Pericardial effusion (noninflammatory): Secondary | ICD-10-CM | POA: Diagnosis present

## 2014-04-02 DIAGNOSIS — F1290 Cannabis use, unspecified, uncomplicated: Secondary | ICD-10-CM | POA: Diagnosis present

## 2014-04-02 DIAGNOSIS — Z79899 Other long term (current) drug therapy: Secondary | ICD-10-CM

## 2014-04-02 DIAGNOSIS — J90 Pleural effusion, not elsewhere classified: Secondary | ICD-10-CM | POA: Diagnosis present

## 2014-04-02 DIAGNOSIS — R569 Unspecified convulsions: Secondary | ICD-10-CM | POA: Diagnosis present

## 2014-04-02 DIAGNOSIS — C787 Secondary malignant neoplasm of liver and intrahepatic bile duct: Principal | ICD-10-CM | POA: Diagnosis present

## 2014-04-02 DIAGNOSIS — D649 Anemia, unspecified: Secondary | ICD-10-CM | POA: Diagnosis present

## 2014-04-02 DIAGNOSIS — C3490 Malignant neoplasm of unspecified part of unspecified bronchus or lung: Secondary | ICD-10-CM | POA: Diagnosis present

## 2014-04-02 DIAGNOSIS — Z66 Do not resuscitate: Secondary | ICD-10-CM | POA: Diagnosis present

## 2014-04-02 DIAGNOSIS — Z7189 Other specified counseling: Secondary | ICD-10-CM

## 2014-04-02 DIAGNOSIS — Z720 Tobacco use: Secondary | ICD-10-CM | POA: Diagnosis not present

## 2014-04-02 LAB — CBC WITH DIFFERENTIAL/PLATELET
BASOS ABS: 0 10*3/uL (ref 0.0–0.1)
Basophils Relative: 0 % (ref 0–1)
Eosinophils Absolute: 0 10*3/uL (ref 0.0–0.7)
Eosinophils Relative: 0 % (ref 0–5)
HCT: 35.3 % — ABNORMAL LOW (ref 39.0–52.0)
Hemoglobin: 11.7 g/dL — ABNORMAL LOW (ref 13.0–17.0)
Lymphocytes Relative: 6 % — ABNORMAL LOW (ref 12–46)
Lymphs Abs: 0.6 10*3/uL — ABNORMAL LOW (ref 0.7–4.0)
MCH: 28.3 pg (ref 26.0–34.0)
MCHC: 33.1 g/dL (ref 30.0–36.0)
MCV: 85.5 fL (ref 78.0–100.0)
MONOS PCT: 7 % (ref 3–12)
Monocytes Absolute: 0.7 10*3/uL (ref 0.1–1.0)
NEUTROS ABS: 8.1 10*3/uL — AB (ref 1.7–7.7)
NEUTROS PCT: 87 % — AB (ref 43–77)
PLATELETS: 163 10*3/uL (ref 150–400)
RBC: 4.13 MIL/uL — AB (ref 4.22–5.81)
RDW: 15.8 % — ABNORMAL HIGH (ref 11.5–15.5)
WBC: 9.4 10*3/uL (ref 4.0–10.5)

## 2014-04-02 LAB — BASIC METABOLIC PANEL
ANION GAP: 8 (ref 5–15)
BUN: 13 mg/dL (ref 6–23)
CO2: 26 mmol/L (ref 19–32)
CREATININE: 0.47 mg/dL — AB (ref 0.50–1.35)
Calcium: 8.3 mg/dL — ABNORMAL LOW (ref 8.4–10.5)
Chloride: 96 mEq/L (ref 96–112)
GFR calc Af Amer: >90 mL/min (ref >90)
GFR calc non Af Amer: >90 mL/min (ref >90)
Glucose, Bld: 75 mg/dL (ref 70–99)
Potassium: 4 mmol/L (ref 3.5–5.1)
Sodium: 130 mmol/L — ABNORMAL LOW (ref 135–145)

## 2014-04-02 LAB — COMPREHENSIVE METABOLIC PANEL WITH GFR
ALT: 30 U/L (ref 0–53)
AST: 44 U/L — ABNORMAL HIGH (ref 0–37)
Albumin: 2.9 g/dL — ABNORMAL LOW (ref 3.5–5.2)
Alkaline Phosphatase: 246 U/L — ABNORMAL HIGH (ref 39–117)
Anion gap: 11 (ref 5–15)
BUN: 12 mg/dL (ref 6–23)
CO2: 25 mmol/L (ref 19–32)
Calcium: 8.2 mg/dL — ABNORMAL LOW (ref 8.4–10.5)
Chloride: 94 meq/L — ABNORMAL LOW (ref 96–112)
Creatinine, Ser: 0.46 mg/dL — ABNORMAL LOW (ref 0.50–1.35)
GFR calc Af Amer: >90 mL/min
GFR calc non Af Amer: >90 mL/min
Glucose, Bld: 84 mg/dL (ref 70–99)
Potassium: 3.8 mmol/L (ref 3.5–5.1)
Sodium: 130 mmol/L — ABNORMAL LOW (ref 135–145)
Total Bilirubin: 0.6 mg/dL (ref 0.3–1.2)
Total Protein: 6.1 g/dL (ref 6.0–8.3)

## 2014-04-02 LAB — URINALYSIS, ROUTINE W REFLEX MICROSCOPIC
Bilirubin Urine: NEGATIVE
Glucose, UA: NEGATIVE mg/dL
Hgb urine dipstick: NEGATIVE
Ketones, ur: NEGATIVE mg/dL
Leukocytes, UA: NEGATIVE
Nitrite: NEGATIVE
Protein, ur: NEGATIVE mg/dL
Specific Gravity, Urine: >1.046 — ABNORMAL HIGH (ref 1.005–1.030)
UROBILINOGEN UA: 0.2 mg/dL (ref 0.0–1.0)
pH: 5.5 (ref 5.0–8.0)

## 2014-04-02 LAB — BLOOD GAS, VENOUS
Acid-Base Excess: 2.3 mmol/L — ABNORMAL HIGH (ref 0.0–2.0)
Bicarbonate: 26.6 mEq/L — ABNORMAL HIGH (ref 20.0–24.0)
FIO2: 0.21 %
O2 Saturation: 65.3 %
PH VEN: 7.424 — AB (ref 7.250–7.300)
Patient temperature: 98
TCO2: 24.1 mmol/L (ref 0–100)
pCO2, Ven: 41.2 mmHg — ABNORMAL LOW (ref 45.0–50.0)

## 2014-04-02 LAB — I-STAT CG4 LACTIC ACID, ED: Lactic Acid, Venous: 2.09 mmol/L (ref 0.5–2.0)

## 2014-04-02 MED ORDER — PANTOPRAZOLE SODIUM 40 MG PO TBEC
40.0000 mg | DELAYED_RELEASE_TABLET | Freq: Every day | ORAL | Status: DC
  Filled 2014-04-02 (×2): qty 1

## 2014-04-02 MED ORDER — HYDROMORPHONE HCL 1 MG/ML IJ SOLN
1.0000 mg | INTRAMUSCULAR | Status: DC | PRN
  Administered 2014-04-02: 1 mg via INTRAVENOUS
  Filled 2014-04-02: qty 1

## 2014-04-02 MED ORDER — IOHEXOL 300 MG/ML  SOLN
100.0000 mL | Freq: Once | INTRAMUSCULAR | Status: AC | PRN
  Administered 2014-04-02: 100 mL via INTRAVENOUS

## 2014-04-02 MED ORDER — SODIUM CHLORIDE 0.9 % IV BOLUS (SEPSIS)
1000.0000 mL | Freq: Once | INTRAVENOUS | Status: AC
  Administered 2014-04-02: 1000 mL via INTRAVENOUS

## 2014-04-02 MED ORDER — HYDROMORPHONE HCL 1 MG/ML IJ SOLN
1.0000 mg | Freq: Once | INTRAMUSCULAR | Status: AC
  Administered 2014-04-02: 1 mg via INTRAVENOUS
  Filled 2014-04-02: qty 1

## 2014-04-02 MED ORDER — ACETAMINOPHEN 650 MG RE SUPP: 650.0000 mg | Freq: Four times a day (QID) | RECTAL | Status: DC | PRN

## 2014-04-02 MED ORDER — SODIUM CHLORIDE 0.9 % IJ SOLN
3.0000 mL | Freq: Two times a day (BID) | INTRAMUSCULAR | Status: DC
  Administered 2014-04-02: 3 mL via INTRAVENOUS

## 2014-04-02 MED ORDER — LORAZEPAM 2 MG/ML IJ SOLN: 1.0000 mg | Freq: Four times a day (QID) | INTRAMUSCULAR | Status: DC | PRN

## 2014-04-02 MED ORDER — OXYCODONE HCL 20 MG/ML PO CONC
5.0000 mg | ORAL | Status: DC | PRN
  Administered 2014-04-02 – 2014-04-03 (×2): 10 mg via ORAL
  Filled 2014-04-02 (×2): qty 1

## 2014-04-02 MED ORDER — HYDROMORPHONE HCL 1 MG/ML IJ SOLN
1.0000 mg | Freq: Once | INTRAMUSCULAR | Status: AC
  Administered 2014-04-02: 1 mg via INTRAVENOUS

## 2014-04-02 MED ORDER — ALBUTEROL SULFATE HFA 108 (90 BASE) MCG/ACT IN AERS: 2.0000 | INHALATION_SPRAY | Freq: Four times a day (QID) | RESPIRATORY_TRACT | Status: DC | PRN

## 2014-04-02 MED ORDER — GABAPENTIN 300 MG PO CAPS
300.0000 mg | ORAL_CAPSULE | Freq: Three times a day (TID) | ORAL | Status: DC
  Administered 2014-04-02 (×2): 300 mg via ORAL
  Filled 2014-04-02 (×7): qty 1

## 2014-04-02 MED ORDER — ONDANSETRON HCL 4 MG PO TABS: 4.0000 mg | ORAL_TABLET | Freq: Four times a day (QID) | ORAL | Status: DC | PRN

## 2014-04-02 MED ORDER — ADULT MULTIVITAMIN W/MINERALS CH
1.0000 | ORAL_TABLET | Freq: Every day | ORAL | Status: DC
  Filled 2014-04-02 (×2): qty 1

## 2014-04-02 MED ORDER — HYDROMORPHONE HCL 1 MG/ML IJ SOLN
1.0000 mg | INTRAMUSCULAR | Status: DC | PRN
  Administered 2014-04-02 (×2): 1 mg via INTRAVENOUS
  Filled 2014-04-02 (×4): qty 1

## 2014-04-02 MED ORDER — SODIUM CHLORIDE 0.9 % IV SOLN: INTRAVENOUS | Status: DC

## 2014-04-02 MED ORDER — ALBUTEROL SULFATE (2.5 MG/3ML) 0.083% IN NEBU: 2.5000 mg | INHALATION_SOLUTION | Freq: Four times a day (QID) | RESPIRATORY_TRACT | Status: DC | PRN

## 2014-04-02 MED ORDER — FONDAPARINUX SODIUM 7.5 MG/0.6ML ~~LOC~~ SOLN
7.5000 mg | SUBCUTANEOUS | Status: DC
  Administered 2014-04-02 – 2014-04-03 (×2): 7.5 mg via SUBCUTANEOUS
  Filled 2014-04-02 (×2): qty 0.6

## 2014-04-02 MED ORDER — METOCLOPRAMIDE HCL 10 MG PO TABS
10.0000 mg | ORAL_TABLET | Freq: Four times a day (QID) | ORAL | Status: DC
  Administered 2014-04-02 – 2014-04-03 (×4): 10 mg via ORAL
  Filled 2014-04-02 (×7): qty 1

## 2014-04-02 MED ORDER — POTASSIUM CHLORIDE CRYS ER 20 MEQ PO TBCR
20.0000 meq | EXTENDED_RELEASE_TABLET | Freq: Every day | ORAL | Status: DC
  Administered 2014-04-02 – 2014-04-03 (×2): 20 meq via ORAL
  Filled 2014-04-02 (×2): qty 1

## 2014-04-02 MED ORDER — ONDANSETRON HCL 4 MG/2ML IJ SOLN: 4.0000 mg | Freq: Three times a day (TID) | INTRAMUSCULAR | Status: DC | PRN

## 2014-04-02 MED ORDER — DEXAMETHASONE 4 MG PO TABS
4.0000 mg | ORAL_TABLET | Freq: Three times a day (TID) | ORAL | Status: DC
  Administered 2014-04-02 – 2014-04-03 (×4): 4 mg via ORAL
  Filled 2014-04-02 (×6): qty 1

## 2014-04-02 MED ORDER — ENSURE COMPLETE PO LIQD
237.0000 mL | Freq: Three times a day (TID) | ORAL | Status: DC
  Administered 2014-04-02: 237 mL via ORAL

## 2014-04-02 MED ORDER — LEVETIRACETAM 500 MG PO TABS
500.0000 mg | ORAL_TABLET | Freq: Two times a day (BID) | ORAL | Status: DC
  Administered 2014-04-02 – 2014-04-03 (×3): 500 mg via ORAL
  Filled 2014-04-02 (×5): qty 1

## 2014-04-02 MED ORDER — FENTANYL 75 MCG/HR TD PT72
150.0000 ug | MEDICATED_PATCH | TRANSDERMAL | Status: DC
  Filled 2014-04-02: qty 2

## 2014-04-02 MED ORDER — ONDANSETRON HCL 4 MG/2ML IJ SOLN
4.0000 mg | Freq: Once | INTRAMUSCULAR | Status: AC
  Administered 2014-04-02: 4 mg via INTRAVENOUS
  Filled 2014-04-02: qty 2

## 2014-04-02 MED ORDER — ACETAMINOPHEN 325 MG PO TABS: 650.0000 mg | ORAL_TABLET | Freq: Four times a day (QID) | ORAL | Status: DC | PRN

## 2014-04-02 MED ORDER — ONDANSETRON HCL 4 MG/2ML IJ SOLN: 4.0000 mg | Freq: Four times a day (QID) | INTRAMUSCULAR | Status: DC | PRN

## 2014-04-02 NOTE — H&P (Signed)
[facility] History and Physical  Chasin Findling BJS:[id number] DOB: 10/23/1975 DOA: 04/02/2014  Referring physician: ER physician. PCP: No PCP Per Patient  Chief Complaint: Abdominal pain.  HPI: Terrance Clark is a 39 y.o. male with history of metastatic lung cancer who is presently being treated at [facility] was recently admitted for seizures and was placed on Keppra presents to the ER because of worsening abdominal pain. Patient states she's been having abdominal pain for the last 1 month but over the last 24 hours it has acutely worsened. Pain is mostly in the lower quadrants. Patient is able to eat and denies any nausea vomiting or diarrhea. Since the pain is acutely worse and patient came to the ER and CT abdomen and pelvis was done which did not show anything acute. CT abdomen did show ascites pleural effusion and mild pericardial effusion. Patient was given IV Dilaudid and admitted for pain control.   Review of Systems: As presented in the history of presenting illness, rest negative.  Past Medical History  Diagnosis Date  . H/O oral surgery 04/2012  . Cough   . Shortness of breath     with exertion  . Insomnia     d/t pain and takes [place] PM  . Hx of radiation therapy 07/16/12- 09/10/12    lower neck and upper chest region , 63 gray in 35 fx  . Hx of radiation therapy 07/18/12    SRS to brain metastasis  . Lung cancer 06/2012    RUL lung  . 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 Cancer     adrenal mets  . History of blood clots   . Lung cancer   . Seizures    Past Surgical History  Procedure Laterality Date  . Foot surgery    . Mouth surgery    . Video bronchoscopy with endobronchial ultrasound Right 06/19/2012    Procedure: VIDEO BRONCHOSCOPY WITH ENDOBRONCHIAL ULTRASOUND;  Surgeon: Collene Gobble, MD;  Location: [place];  Service: Pulmonary;  Laterality: Right;   Social History:  reports that he has been smoking Cigarettes.  He has a 22 pack-year smoking history. He has never  used smokeless tobacco. He reports that he uses illicit drugs (Marijuana). He reports that he does not drink alcohol. Where does patient live home. Can patient participate in ADLs? No.  No Known Allergies  Family History:  Family History  Problem Relation Age of Onset  . Cancer Maternal Aunt     unknown  . Cancer Mother     breast ca 2007, surgery right breast,chemotherapy,no rad txx  . Cancer Maternal Grandfather     colon      Prior to Admission medications   Medication Sig Start Date End Date Taking? Authorizing Provider  albuterol (PROVENTIL HFA;VENTOLIN HFA) 108 (90 BASE) MCG/ACT inhaler Inhale 2 puffs into the lungs every 6 (six) hours as needed for wheezing or shortness of breath.   Yes Historical Provider, MD  dexamethasone (DECADRON) 4 MG tablet Take 1 tablet (4 mg total) by mouth 3 (three) times daily. 03/11/14  Yes Debbe Odea, MD  fentaNYL (DURAGESIC - DOSED MCG/HR) 75 MCG/HR Place 150 mcg onto the skin every 3 (three) days.   Yes Historical Provider, MD  fondaparinux (ARIXTRA) 7.5 MG/0.6ML SOLN injection Inject 7.5 mg into the skin daily.   Yes Historical Provider, MD  gabapentin (NEURONTIN) 300 MG capsule Take 300 mg by mouth every 8 (eight) hours.   Yes Historical Provider, MD  HYDROmorphone (DILAUDID) 4 MG tablet  Take 4 mg by mouth every 4 (four) hours as needed for severe pain.   Yes Historical Provider, MD  levETIRAcetam (KEPPRA) 500 MG tablet Take 1 tablet (500 mg total) by mouth 2 (two) times daily. 03/11/14  Yes Debbe Odea, MD  metoCLOPramide (REGLAN) 10 MG tablet Take 10 mg by mouth 4 (four) times daily.   Yes Historical Provider, MD  pantoprazole (PROTONIX) 40 MG tablet Take 40 mg by mouth daily.   Yes Historical Provider, MD  potassium chloride SA (K-DUR,KLOR-CON) 20 MEQ tablet Take 20 mEq by mouth daily.   Yes Historical Provider, MD  cyclobenzaprine (FLEXERIL) 10 MG tablet Take 1 tablet (10 mg total) by mouth 2 (two) times daily as needed for muscle  spasms. Patient not taking: Reported on 03/08/2014 11/29/13   Jasper Riling. Pickering, MD  docusate sodium (COLACE) 100 MG capsule Take 1 capsule (100 mg total) by mouth every 12 (twelve) hours. Patient not taking: Reported on 03/08/2014 12/22/13   Leota Jacobsen, MD  morphine (MS CONTIN) 30 MG 12 hr tablet Take 1 tablet (30 mg total) by mouth every 12 (twelve) hours. Patient not taking: Reported on 03/08/2014 12/22/13   Lora Paula, MD  oxyCODONE (OXY IR/ROXICODONE) 5 MG immediate release tablet Take 1-3 tablets (5-15 mg total) by mouth every 4 (four) hours as needed for severe pain. Patient not taking: Reported on 03/08/2014 12/19/13   Lora Paula, MD  oxyCODONE-acetaminophen (PERCOCET) 10-325 MG per tablet Take 1 tablet by mouth every 4 (four) hours as needed for pain. Patient not taking: Reported on 03/08/2014 12/11/13   Abigail Butts, PA-C    Physical Exam: Filed Vitals:   04/02/14 0400 04/02/14 0536 04/02/14 0600 04/02/14 0640  BP: 109/82 105/70 112/75 100/71  Pulse: 114 113 113 110  Temp:    98.2 F (36.8 C)  TempSrc:    Oral  Resp:  18  18  Height:    5\' 11"  (1.803 m)  Weight:    50.2 kg (110 lb 10.7 oz)  SpO2: 100% 93% 94% 90%     General:  Poorly built and nourished.  Eyes: Anicteric no pallor.  ENT: No discharge from ears eyes nose and mouth.  Neck: No mass felt.  Cardiovascular: S1 and S2 heard.  Respiratory: No rhonchi or crepitations.  Abdomen: Soft nontender bowel sounds present.  Skin: No rash.  Musculoskeletal: No edema.  Psychiatric: Appears normal.  Neurologic: Alert awake oriented to time place and person. Moves all extremities but weak on the lower extremities.  Labs on Admission:  Basic Metabolic Panel:  Recent Labs Lab 04/02/14 0305  NA 124*  K 3.8  CL 91*  CO2 25  GLUCOSE 84  BUN 12  CREATININE 0.46*  CALCIUM 7.7*   Liver Function Tests:  Recent Labs Lab 04/02/14 0305  AST 44*  ALT 30  ALKPHOS 246*  BILITOT  0.6  PROT 6.1  ALBUMIN 2.9*   No results for input(s): LIPASE, AMYLASE in the last 168 hours. No results for input(s): AMMONIA in the last 168 hours. CBC:  Recent Labs Lab 04/02/14 0305  WBC 9.4  NEUTROABS 8.1*  HGB 11.7*  HCT 35.3*  MCV 85.5  PLT 163   Cardiac Enzymes: No results for input(s): CKTOTAL, CKMB, CKMBINDEX, TROPONINI in the last 168 hours.  BNP (last 3 results) No results for input(s): PROBNP in the last 8760 hours. CBG: No results for input(s): GLUCAP in the last 168 hours.  Radiological Exams on Admission: Ct Abdomen Pelvis  W Contrast  04/02/2014   CLINICAL DATA:  Metastatic lung cancer with bilateral lower abdominal pain.  EXAM: CT ABDOMEN AND PELVIS WITH CONTRAST  TECHNIQUE: Multidetector CT imaging of the abdomen and pelvis was performed using the standard protocol following bolus administration of intravenous contrast.  CONTRAST:  115mL OMNIPAQUE IOHEXOL 300 MG/ML  SOLN  COMPARISON:  12/10/2013  FINDINGS: BODY WALL: Cachexia. There is a cystic appearing structure in the right inguinal canal measuring 18 mm. This is favored to represent ascitic fluid rather than a soft tissue metastasis given location in the canal.  LOWER CHEST: There is a moderate to large right pleural effusion which is layering. This fluid is new from comparison. Small nodules are noted in the bilateral lower lungs compatible with metastatic disease. There is a trace left pleural effusion. Small, water density pericardial effusion.  ABDOMEN/PELVIS:  Liver: Innumerable hypoenhancing liver masses which have progressed in number and size from prior. The largest area of metastatic involvement is in the left liver where there is a 6 cm confluent metastasis.  Biliary: No evidence of biliary obstruction or stone.  Pancreas: Unremarkable.  Spleen: Unremarkable.  Adrenals: Bilateral adrenal metastases with interval enlargement. The right adrenal gland now measures 7 x 4.2 cm, previously 6 x 3.2 cm.  Kidneys  and ureters: No hydronephrosis or stone.  Bladder: Unremarkable.  Reproductive: Unremarkable.  Bowel: No obstruction.  Retroperitoneum: No discrete mass.  Peritoneum: New small volume ascites, mainly located in the pelvis.  Vascular: No acute abnormality.  OSSEOUS: Sclerotic and lytic metastases are present throughout the skeleton. No definitive spinal canal or other extraosseous spread. No pathologic fracture to explain pain.  IMPRESSION: 1. No definitive cause of lower abdominal pain. 2. Progressive metastatic disease to the liver, adrenal glands, lungs, and skeleton. 3. Ascites, pleural effusions, and small pericardial effusion.   Electronically Signed   By: Jorje Guild M.D.   On: 04/02/2014 05:04     Assessment/Plan Active Problems:   Seizures   Protein-calorie malnutrition, severe   Thrombosis- extensive   Abdominal pain   Cancer related pain   Hyponatremia   Metastatic lung cancer (metastasis from lung to other site)   1. Abdominal pain - cause not clear. CT abdomen does not show anything acute but does show some ascites. At this time I have placed patient on when necessary IV Dilaudid along with his home pain medication including fentanyl patch. I have consulted palliative team for goals of care discussion. 2. History of extensive thrombosis - on Arixtra. 3. 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 At this time we will order fluids and check urine sodium and osmolality and closely follow metabolic panel. 4. Seizures, partial - was placed on Keppra which be continued along with Decadron. 5. Mild anemia - follow CBC.   DVT Prophylaxis on Arixtra.  Code Status: Full code.  Family Communication: Patient's girlfriend at the bedside. Disposition Plan: Admit to inpatient.    Hikari Tripp N. [facility] Pager [phone].  If 7PM-7AM, please contact night-coverage [email or web address] Password [email or web address] 04/02/2014, 7:04 AM

## 2014-04-02 NOTE — ED Notes (Addendum)
Gave Lactic  Acid to Dr. Florina Ou.

## 2014-04-02 NOTE — ED Provider Notes (Signed)
CSN: [id number]     Arrival date & time 04/02/14  0203 History   First MD Initiated Contact with Patient 04/02/14 0209     Chief Complaint  Patient presents with  . 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 He is here with a 2 day history of abdominal pain. The pain is located in the lower abdomen diffusely. The pain is sharp and worse with movement, palpation and certain positions.  It is moderate to severe and not relieved by his home medications. He has had nausea and vomiting with it. He denies fever, diarrhea or constipation. He states he feels short of breath.   Past Medical History  Diagnosis Date  . H/O oral surgery 04/2012  . Cough   . Shortness of breath     with exertion  . Insomnia     d/t pain and takes Goody's PM  . Hx of radiation therapy 07/16/12- 09/10/12    lower neck and upper chest region , 63 gray in 35 fx  . Hx of radiation therapy 07/18/12    SRS to brain metastasis  . Lung cancer 06/2012    RUL lung  . 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 Cancer     adrenal mets  . History of blood clots   . Lung cancer   . Seizures    Past Surgical History  Procedure Laterality Date  . Foot surgery    . Mouth surgery    . Video bronchoscopy with endobronchial ultrasound Right 06/19/2012    Procedure: VIDEO BRONCHOSCOPY WITH ENDOBRONCHIAL ULTRASOUND;  Surgeon: Collene Gobble, MD;  Location: [facility];  Service: Pulmonary;  Laterality: Right;   Family History  Problem Relation Age of Onset  . Cancer Maternal Aunt     unknown  . Cancer Mother     breast ca 2007, surgery right breast,chemotherapy,no rad txx  . Cancer Maternal Grandfather     colon   History  Substance Use Topics  . Smoking status: Current Some Day Smoker -- 1.00 packs/day for 22 years    Types: Cigarettes  . Smokeless tobacco: Never Used   Comment: 2 cigs since 06/03/12///ldc  . Alcohol Use: No    Review of Systems  All other systems reviewed and are negative.   Allergies  Review of patient's allergies indicates no known allergies.  Home Medications   Prior to Admission medications   Medication Sig Start Date End Date Taking? Authorizing Provider  albuterol (PROVENTIL HFA;VENTOLIN HFA) 108 (90 BASE) MCG/ACT inhaler Inhale 2 puffs into the lungs every 6 (six) hours as needed for wheezing or shortness of breath.   Yes Historical Provider, MD  dexamethasone (DECADRON) 4 MG tablet Take 1 tablet (4 mg total) by mouth 3 (three) times daily. 03/11/14  Yes Debbe Odea, MD  fentaNYL (DURAGESIC - DOSED MCG/HR) 75 MCG/HR Place 150 mcg onto the skin every 3 (three) days.   Yes Historical Provider, MD  fondaparinux (ARIXTRA) 7.5 MG/0.6ML SOLN injection Inject 7.5 mg into the skin daily.   Yes Historical Provider, MD  gabapentin (NEURONTIN) 300 MG capsule Take 300 mg by mouth every 8 (eight) hours.   Yes Historical Provider, MD  HYDROmorphone (DILAUDID) 4 MG tablet Take 4 mg by mouth every 4 (four) hours as needed for severe pain.   Yes Historical Provider, MD  levETIRAcetam (KEPPRA) 500 MG tablet Take 1 tablet (  500 mg total) by mouth 2 (two) times daily. 03/11/14  Yes Debbe Odea, MD  metoCLOPramide (REGLAN) 10 MG tablet Take 10 mg by mouth 4 (four) times daily.   Yes Historical Provider, MD  pantoprazole (PROTONIX) 40 MG tablet Take 40 mg by mouth daily.   Yes Historical Provider, MD  potassium chloride SA (K-DUR,KLOR-CON) 20 MEQ tablet Take 20 mEq by mouth daily.   Yes Historical Provider, MD  cyclobenzaprine (FLEXERIL) 10 MG tablet Take 1 tablet (10 mg total) by mouth 2 (two) times daily as needed for muscle spasms. Patient not taking: Reported on 03/08/2014 11/29/13   Jasper Riling. Pickering, MD  docusate sodium (COLACE) 100 MG capsule Take 1 capsule (100 mg total) by mouth every 12 (twelve) hours. Patient not taking: Reported on  03/08/2014 12/22/13   Leota Jacobsen, MD  morphine (MS CONTIN) 30 MG 12 hr tablet Take 1 tablet (30 mg total) by mouth every 12 (twelve) hours. Patient not taking: Reported on 03/08/2014 12/22/13   Lora Paula, MD  oxyCODONE (OXY IR/ROXICODONE) 5 MG immediate release tablet Take 1-3 tablets (5-15 mg total) by mouth every 4 (four) hours as needed for severe pain. Patient not taking: Reported on 03/08/2014 12/19/13   Lora Paula, MD  oxyCODONE-acetaminophen (PERCOCET) 10-325 MG per tablet Take 1 tablet by mouth every 4 (four) hours as needed for pain. Patient not taking: Reported on 03/08/2014 12/11/13   Jarrett Soho Muthersbaugh, PA-C   BP 109/82 mmHg  Pulse 114  Temp(Src) 98 F (36.7 C) (Oral)  Resp 24  SpO2 100%   Physical Exam  General: Well-developed, well-nourished male in no acute distress; appearance consistent with age of record HENT: normocephalic; atraumatic Eyes: pupils equal, round and reactive to light; extraocular muscles intact Neck: supple Heart: regular rate and rhythm; tachycardia Lungs: Tachypnea; distant sounds Abdomen: Firm; nondistended; lower abdominal tenderness; bowel sounds present Extremities: No deformity; full range of motion; pulses normal Neurologic: Awake, alert and oriented; motor function intact in all extremities and symmetric; no facial droop Skin: Warm and dry Psychiatric: Flat affect    ED Course  Procedures (including critical care time)   MDM  Nursing notes and vitals signs, including pulse oximetry, reviewed.  Summary of this visit's results, reviewed by myself:  Labs:  Results for orders placed or performed during the hospital encounter of 04/02/14 (from the past 24 hour(s))  Comprehensive metabolic panel     Status: Abnormal   Collection Time: 04/02/14  3:05 AM  Result Value Ref Range   Sodium 124 (L) 135 - 145 mmol/L   Potassium 3.8 3.5 - 5.1 mmol/L   Chloride 91 (L) 96 - 112 mEq/L   CO2 25 19 - 32 mmol/L   Glucose, Bld  84 70 - 99 mg/dL   BUN 12 6 - 23 mg/dL   Creatinine, Ser 0.46 (L) 0.50 - 1.35 mg/dL   Calcium 7.7 (L) 8.4 - 10.5 mg/dL   Total Protein 6.1 6.0 - 8.3 g/dL   Albumin 2.9 (L) 3.5 - 5.2 g/dL   AST 44 (H) 0 - 37 U/L   ALT 30 0 - 53 U/L   Alkaline Phosphatase 246 (H) 39 - 117 U/L   Total Bilirubin 0.6 0.3 - 1.2 mg/dL   GFR calc non Af Amer >90 >90 mL/min   GFR calc Af Amer >90 >90 mL/min   Anion gap 8 5 - 15  CBC with Differential     Status: Abnormal   Collection Time: 04/02/14  3:05  AM  Result Value Ref Range   WBC 9.4 4.0 - 10.5 K/uL   RBC 4.13 (L) 4.22 - 5.81 MIL/uL   Hemoglobin 11.7 (L) 13.0 - 17.0 g/dL   HCT 35.3 (L) 39.0 - 52.0 %   MCV 85.5 78.0 - 100.0 fL   MCH 28.3 26.0 - 34.0 pg   MCHC 33.1 30.0 - 36.0 g/dL   RDW 15.8 (H) 11.5 - 15.5 %   Platelets 163 150 - 400 K/uL   Neutrophils Relative % 87 (H) 43 - 77 %   Neutro Abs 8.1 (H) 1.7 - 7.7 K/uL   Lymphocytes Relative 6 (L) 12 - 46 %   Lymphs Abs 0.6 (L) 0.7 - 4.0 K/uL   Monocytes Relative 7 3 - 12 %   Monocytes Absolute 0.7 0.1 - 1.0 K/uL   Eosinophils Relative 0 0 - 5 %   Eosinophils Absolute 0.0 0.0 - 0.7 K/uL   Basophils Relative 0 0 - 1 %   Basophils Absolute 0.0 0.0 - 0.1 K/uL  Blood gas, venous     Status: Abnormal   Collection Time: 04/02/14  3:16 AM  Result Value Ref Range   FIO2 0.21 %   pH, Ven 7.424 (H) 7.250 - 7.300   pCO2, Ven 41.2 (L) 45.0 - 50.0 mmHg   pO2, Ven BELOW REPORTABLE RANGE. 30.0 - 45.0 mmHg   Bicarbonate 26.6 (H) 20.0 - 24.0 mEq/L   TCO2 24.1 0 - 100 mmol/L   Acid-Base Excess 2.3 (H) 0.0 - 2.0 mmol/L   O2 Saturation 65.3 %   Patient temperature 98.0    Collection site VEIN    Drawn by COLLECTED BY LABORATORY    Sample type VENOUS   I-Stat CG4 Lactic Acid, ED     Status: Abnormal   Collection Time: 04/02/14  4:28 AM  Result Value Ref Range   Lactic Acid, Venous 2.09 (HH) 0.5 - 2.0 mmol/L    Imaging Studies: Ct Abdomen Pelvis W Contrast  04/02/2014   CLINICAL DATA:  Metastatic lung  cancer with bilateral lower abdominal pain.  EXAM: CT ABDOMEN AND PELVIS WITH CONTRAST  TECHNIQUE: Multidetector CT imaging of the abdomen and pelvis was performed using the standard protocol following bolus administration of intravenous contrast.  CONTRAST:  136mL OMNIPAQUE IOHEXOL 300 MG/ML  SOLN  COMPARISON:  12/10/2013  FINDINGS: BODY WALL: Cachexia. There is a cystic appearing structure in the right inguinal canal measuring 18 mm. This is favored to represent ascitic fluid rather than a soft tissue metastasis given location in the canal.  LOWER CHEST: There is a moderate to large right pleural effusion which is layering. This fluid is new from comparison. Small nodules are noted in the bilateral lower lungs compatible with metastatic disease. There is a trace left pleural effusion. Small, water density pericardial effusion.  ABDOMEN/PELVIS:  Liver: Innumerable hypoenhancing liver masses which have progressed in number and size from prior. The largest area of metastatic involvement is in the left liver where there is a 6 cm confluent metastasis.  Biliary: No evidence of biliary obstruction or stone.  Pancreas: Unremarkable.  Spleen: Unremarkable.  Adrenals: Bilateral adrenal metastases with interval enlargement. The right adrenal gland now measures 7 x 4.2 cm, previously 6 x 3.2 cm.  Kidneys and ureters: No hydronephrosis or stone.  Bladder: Unremarkable.  Reproductive: Unremarkable.  Bowel: No obstruction.  Retroperitoneum: No discrete mass.  Peritoneum: New small volume ascites, mainly located in the pelvis.  Vascular: No acute abnormality.  OSSEOUS: Sclerotic and  lytic metastases are present throughout the skeleton. No definitive spinal canal or other extraosseous spread. No pathologic fracture to explain pain.  IMPRESSION: 1. No definitive cause of lower abdominal pain. 2. Progressive metastatic disease to the liver, adrenal glands, lungs, and skeleton. 3. Ascites, pleural effusions, and small pericardial  effusion.   Electronically Signed   By: Jorje Guild M.D.   On: 04/02/2014 05:04   6:09 AM Dr. Hal Hope to admit. Will likely need Palliative Care consult as his quality of life is worsening and he is failing outpatient management.     Wynetta Fines, MD 04/02/14 [phone]

## 2014-04-02 NOTE — Progress Notes (Signed)
Patient ID: Terrance Clark, male   DOB: January 07, 1976, 39 y.o.   MRN: [id number] [facility] PROGRESS NOTE  Akiva Brassfield WER:[id number] DOB: 19-May-1975 DOA: 04/02/2014 PCP: No PCP Per Patient  Brief narrative:    Addendum to admission note done 04/02/2014 39 year old male with history of metastatic lung cancer, being treated at present at [place], recent admission for seizure. He presented to [facility] ED because of worsening abdominal pain. The girlfriend at the bedside reports they were supposed to go back to [place] but he did not feel good for which reason they decided to come to emergency room. He has nausea but no vomiting. CT abdomen did show ascites, pleural effusion and mild pericardial effusion. Patient was given IV Dilaudid and admitted for pain control.   Assessment/Plan:    Active Problems: Abdominal pain - Scan on the admission showed progressive metastatic disease to the liver, adrenal glands lungs and skeleton. - Agree with continuing pain management with Dilaudid 1 mg IV every 3 hours as needed.    DVT Prophylaxis  - he is on fondaparinux   Code Status: Full.  Family Communication:  plan of care discussed with the patient Disposition Plan: Home when stable.   IV access:  Peripheral IV  Procedures and diagnostic studies:    Ct Abdomen Pelvis W Contrast 04/02/2014  1. No definitive cause of lower abdominal pain. 2. Progressive metastatic disease to the liver, adrenal glands, lungs, and skeleton. 3. Ascites, pleural effusions, and small pericardial effusion.    Dg Chest Port 1 View 04/02/2014  Shifting of right-sided pleural effusion.  Bilateral pulmonary nodules.  Pulmonary vascular congestion. Interstitial spread of tumor may contribute to this appearance.     Medical Consultants:  None   Other Consultants:  None   IAnti-Infectives:   None    Leisa Lenz, MD  [facility] Pager [phone]  If 7PM-7AM, please contact  night-coverage [email or web address] Password [facility] 04/02/2014, 9:13 AM   LOS: 0 days    HPI/Subjective: No acute overnight events.  Objective: Filed Vitals:   04/02/14 0400 04/02/14 0536 04/02/14 0600 04/02/14 0640  BP: 109/82 105/70 112/75 100/71  Pulse: 114 113 113 110  Temp:    98.2 F (36.8 C)  TempSrc:    Oral  Resp:  18  18  Height:    5\' 11"  (1.803 m)  Weight:    50.2 kg (110 lb 10.7 oz)  SpO2: 100% 93% 94% 90%    Intake/Output Summary (Last 24 hours) at 04/02/14 0913 Last data filed at 04/02/14 0700  Gross per 24 hour  Intake      0 ml  Output    200 ml  Net   -200 ml    Exam:   General:  Pt is alert, follows commands appropriately, not in acute distress  Cardiovascular: Regular rate and rhythm, S1/S2, no murmurs  Respiratory: Clear to auscultation bilaterally, no wheezing, no crackles, no rhonchi  Abdomen:tender to palpation diffusely   Extremities: No edema, pulses DP and PT palpable bilaterally  Neuro: Grossly nonfocal  Data Reviewed: Basic Metabolic Panel:  Recent Labs Lab 04/02/14 0305  NA 124*  K 3.8  CL 91*  CO2 25  GLUCOSE 84  BUN 12  CREATININE 0.46*  CALCIUM 7.7*   Liver Function Tests:  Recent Labs Lab 04/02/14 0305  AST 44*  ALT 30  ALKPHOS 246*  BILITOT 0.6  PROT 6.1  ALBUMIN 2.9*   No results for input(s): LIPASE, AMYLASE in the last 168 hours. No  results for input(s): AMMONIA in the last 168 hours. CBC:  Recent Labs Lab 04/02/14 0305  WBC 9.4  NEUTROABS 8.1*  HGB 11.7*  HCT 35.3*  MCV 85.5  PLT 163   Cardiac Enzymes: No results for input(s): CKTOTAL, CKMB, CKMBINDEX, TROPONINI in the last 168 hours. BNP: Invalid input(s): POCBNP CBG: No results for input(s): GLUCAP in the last 168 hours.  No results found for this or any previous visit (from the past 240 hour(s)).   Scheduled Meds: . dexamethasone  4 mg Oral TID  . fentaNYL  150 mcg Transdermal Q72H  . fondaparinux  7.5 mg Subcutaneous Q24H  .  gabapentin  300 mg Oral 3 times per day  . levETIRAcetam  500 mg Oral BID  . metoCLOPramide  10 mg Oral QID  . pantoprazole  40 mg Oral Daily  . potassium chloride SA  20 mEq Oral Daily  . sodium chloride  3 mL Intravenous Q12H   Continuous Infusions:

## 2014-04-02 NOTE — ED Notes (Signed)
Bed: WA17 Expected date:  Expected time:  Means of arrival:  Comments: EMS 39 yo male with metastatic cancer/abdominal pain

## 2014-04-02 NOTE — Consult Note (Signed)
Patient RF:Terrance Clark      DOB: Sep 28, 1975      TGP:[id number]     Consult Note from the Palliative Medicine Team at [facility] Requested by: Terrance Clark     PCP: No PCP Per Patient Reason for Consultation: Clarification of [place] and options    Phone Number:None  Assessment of patients Current state:  Continued physical and functional decline 2/2 to metastatic lung cancer to brain, bone,liver and adrenals.  Poor prognosis, patient is faced with advanced directive decisions and anticipatory care needs. Prior to today's conversation family had poor prognostic awareness.  Consult is for review of medical treatment options, clarification of goals of care and end of life issues, disposition and options, and symptom recommendation.  This NP Terrance Clark reviewed medical records, received report from team, assessed the patient and then meet at the patient's bedside along with his SO/girlfriend Terrance Clark  to discuss diagnosis prognosis, GOC, EOL wishes disposition and options.  I then reviewed the same information with brother Terrance Clark by telephone  I have worked with  Terrance Clark in the [facility] radiation-oncology clinic in the past.  A detailed discussion was had today regarding advanced directives.  Concepts specific to code status, artifical feeding and hydration, continued IV antibiotics and rehospitalization was had.  The difference between a aggressive medical intervention path  and a palliative comfort care path for this patient at this time was had.  Values and goals of care important to patient and family were attempted to be elicited.  Concept of Hospice and Palliative Care were discussed  Questions and concerns addressed.  Family encouraged to call with questions or concerns.  PMT will continue to support holistically.  I discussed with Welden that we will meet again tomorrow when he is hopefully better able to engage in conversation (today he is sleepy and  lethargic)  Goals of Care: 1.  Code Status: DNR/DNI   2. Scope of Treatment: Currently Terrance Clark is receiving his treatment at the [facility] in [place].  Today when we discussed prognosis Terrance Clark explained that the center has never mentioned prognosis only that "they would beat this".  She is appreciative of today's conversation  3. Disposition:  Pending outcomes.  I did discussed hospice benefit as it relates to hospice at home vs a inpatient facility.   4. Symptom Management:    Anxiety/Agitation: Ativan 1 mg IV every 6 hrs prn  Pain: Fentanyl 150 mcg patch/Dilaudid 1 mg IV every 3 hr (written by attending)-no changes at this time, patient is resting after a "terrible night", will re-evaluate in the morning  5. Psychosocial:  Emotional support to patient and his SO/[name]   Brief HPI:  DIAGNOSIS: Metastatic non-small cell lung cancer, adenocarcinoma with unknown status of the EGFR mutation or ALK gene translocation secondary to insufficient material, diagnosed in April 2014.   Systemic chemotherapy under the care of Terrance Clark and stereotactic radiotherapy under Terrance Clark initially and then patient transitioned care to [facility].  Admitted with worsening abdominal pain, h/o seizure.  Overall failure to thrive   ROS: abdominal pain. Weakness, fatigue, weight loss   PMH:  Past Medical History  Diagnosis Date  . H/O oral surgery 04/2012  . Cough   . Shortness of breath     with exertion  . Insomnia     d/t pain and takes Goody's PM  . Hx of radiation therapy 07/16/12- 09/10/12    lower neck and upper chest region ,  63 gray in 35 fx  . Hx of radiation therapy 07/18/12    SRS to brain metastasis  . Lung cancer 06/2012    RUL lung  . 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 Cancer     adrenal mets  . History of blood clots   . Lung cancer   . Seizures      PSH: Past Surgical History  Procedure Laterality Date  . Foot surgery    . Mouth surgery    . Video  bronchoscopy with endobronchial ultrasound Right 06/19/2012    Procedure: VIDEO BRONCHOSCOPY WITH ENDOBRONCHIAL ULTRASOUND;  Surgeon: Terrance Clark;  Location: [facility];  Service: Pulmonary;  Laterality: Right;   I have reviewed the Terrance Clark and SH and  If appropriate update it with new information. No Known Allergies Scheduled Meds: . dexamethasone  4 mg Oral TID  . fentaNYL  150 mcg Transdermal Q72H  . fondaparinux  7.5 mg Subcutaneous Q24H  . gabapentin  300 mg Oral 3 times per day  . levETIRAcetam  500 mg Oral BID  . metoCLOPramide  10 mg Oral QID  . pantoprazole  40 mg Oral Daily  . potassium chloride SA  20 mEq Oral Daily  . sodium chloride  3 mL Intravenous Q12H   Continuous Infusions:  PRN Meds:.acetaminophen **OR** acetaminophen, albuterol, HYDROmorphone (DILAUDID) injection, ondansetron **OR** ondansetron (ZOFRAN) IV    BP 100/71 mmHg  Pulse 110  Temp(Src) 98.2 F (36.8 C) (Oral)  Resp 18  Ht 5' 11"  (1.803 m)  Wt 50.2 kg (110 lb 10.7 oz)  BMI 15.44 kg/m2  SpO2 90%   PPS:40 %   Intake/Output Summary (Last 24 hours) at 04/02/14 1205 Last data filed at 04/02/14 0700  Gross per 24 hour  Intake      0 ml  Output    200 ml  Net   -200 ml    Physical Exam:  General: ill appearing, weak and letahrgic HEENT:  Moist buccal membranes CVS: tachycardic Abdomen:slightly distended, firm  Labs: CBC    Component Value Date/Time   WBC 9.4 04/02/2014 0305   WBC 7.4 08/29/2013 1136   RBC 4.13* 04/02/2014 0305   RBC 4.61 08/29/2013 1136   HGB 11.7* 04/02/2014 0305   HGB 13.6 08/29/2013 1136   HCT 35.3* 04/02/2014 0305   HCT 41.4 08/29/2013 1136   PLT 163 04/02/2014 0305   PLT 116* 08/29/2013 1136   MCV 85.5 04/02/2014 0305   MCV 89.8 08/29/2013 1136   MCH 28.3 04/02/2014 0305   MCH 29.6 08/29/2013 1136   MCHC 33.1 04/02/2014 0305   MCHC 33.0 08/29/2013 1136   RDW 15.8* 04/02/2014 0305   RDW 15.9* 08/29/2013 1136   LYMPHSABS 0.6* 04/02/2014 0305   LYMPHSABS 0.8*  08/29/2013 1136   MONOABS 0.7 04/02/2014 0305   MONOABS 0.4 08/29/2013 1136   EOSABS 0.0 04/02/2014 0305   EOSABS 0.0 08/29/2013 1136   BASOSABS 0.0 04/02/2014 0305   BASOSABS 0.0 08/29/2013 1136    BMET    Component Value Date/Time   NA 130* 04/02/2014 1100   NA 139 08/29/2013 1136   K 4.0 04/02/2014 1100   K 3.4* 08/29/2013 1136   CL 96 04/02/2014 1100   CL 103 09/02/2012 0955   CO2 26 04/02/2014 1100   CO2 22 08/29/2013 1136   GLUCOSE 75 04/02/2014 1100   GLUCOSE 96 08/29/2013 1136   GLUCOSE 85 09/02/2012 0955   BUN 13 04/02/2014 1100   BUN 22.1 08/29/2013 1136  CREATININE 0.47* 04/02/2014 1100   CREATININE 0.9 08/29/2013 1136   CALCIUM 8.3* 04/02/2014 1100   CALCIUM 9.3 08/29/2013 1136   GFRNONAA >90 04/02/2014 1100   GFRAA >90 04/02/2014 1100    CMP     Component Value Date/Time   NA 130* 04/02/2014 1100   NA 139 08/29/2013 1136   K 4.0 04/02/2014 1100   K 3.4* 08/29/2013 1136   CL 96 04/02/2014 1100   CL 103 09/02/2012 0955   CO2 26 04/02/2014 1100   CO2 22 08/29/2013 1136   GLUCOSE 75 04/02/2014 1100   GLUCOSE 96 08/29/2013 1136   GLUCOSE 85 09/02/2012 0955   BUN 13 04/02/2014 1100   BUN 22.1 08/29/2013 1136   CREATININE 0.47* 04/02/2014 1100   CREATININE 0.9 08/29/2013 1136   CALCIUM 8.3* 04/02/2014 1100   CALCIUM 9.3 08/29/2013 1136   PROT 6.1 04/02/2014 0305   PROT 6.9 08/29/2013 1136   ALBUMIN 2.9* 04/02/2014 0305   ALBUMIN 3.9 08/29/2013 1136   AST 44* 04/02/2014 0305   AST 16 08/29/2013 1136   ALT 30 04/02/2014 0305   ALT 38 08/29/2013 1136   ALKPHOS 246* 04/02/2014 0305   ALKPHOS 89 08/29/2013 1136   BILITOT 0.6 04/02/2014 0305   BILITOT 0.21 08/29/2013 1136   GFRNONAA >90 04/02/2014 1100   GFRAA >90 04/02/2014 1100     Time In Time Out Total Time Spent with Patient Total Overall Time  1200 1330 70 min 75 min    Greater than 50%  of this time was spent counseling and coordinating care related to the above assessment and  plan.   Terrance Lessen NP  Palliative Medicine Team Team Phone # [phone] Pager [phone]  Discussed with Terrance Clark

## 2014-04-02 NOTE — ED Notes (Signed)
Pt presents with c/o right and left lower quadrant abdominal pain, which he says he has hx of but worsened today, pt has hx of lung ca, with metastasis to bone, applied 2 Fentanyl patch today without getting relief. Rating his pain 10/10. Mildly tachypnec , in pain induced distress.

## 2014-04-03 DIAGNOSIS — G893 Neoplasm related pain (acute) (chronic): Secondary | ICD-10-CM

## 2014-04-03 DIAGNOSIS — R627 Adult failure to thrive: Secondary | ICD-10-CM | POA: Insufficient documentation

## 2014-04-03 MED ORDER — LEVETIRACETAM 500 MG PO TABS: 500.0000 mg | ORAL_TABLET | Freq: Two times a day (BID) | ORAL | Status: DC

## 2014-04-03 MED ORDER — FONDAPARINUX SODIUM 7.5 MG/0.6ML ~~LOC~~ SOLN: 7.5000 mg | SUBCUTANEOUS | Status: DC

## 2014-04-03 MED ORDER — ACETAMINOPHEN 325 MG PO TABS: 650.0000 mg | ORAL_TABLET | Freq: Four times a day (QID) | ORAL | Status: AC | PRN

## 2014-04-03 MED ORDER — ALBUTEROL SULFATE HFA 108 (90 BASE) MCG/ACT IN AERS: 2.0000 | INHALATION_SPRAY | Freq: Four times a day (QID) | RESPIRATORY_TRACT | Status: AC | PRN

## 2014-04-03 MED ORDER — ENSURE COMPLETE PO LIQD: 237.0000 mL | Freq: Three times a day (TID) | ORAL | Status: DC

## 2014-04-03 MED ORDER — OXYCODONE HCL 20 MG/ML PO CONC: 5.0000 mg | ORAL | Status: DC | PRN

## 2014-04-03 MED ORDER — HYDROMORPHONE HCL 4 MG PO TABS: 4.0000 mg | ORAL_TABLET | ORAL | Status: DC | PRN

## 2014-04-03 MED ORDER — ONDANSETRON HCL 4 MG PO TABS: 4.0000 mg | ORAL_TABLET | Freq: Four times a day (QID) | ORAL | Status: AC | PRN

## 2014-04-03 NOTE — Progress Notes (Signed)
Patient has reported a decrease in pain in abdomen to 5-6 since start of shift. Been snacking on cranberry juice and cereal without complaints. Only one prn given this shift (oxyir solution). Fentanyl patches on r arm.

## 2014-04-03 NOTE — Care Management Note (Signed)
CARE MANAGEMENT NOTE 04/03/2014  Patient:  [facility]   Account Number:  [id number]  Date Initiated:  04/03/2014  Documentation initiated by:  Marney Doctor  Subjective/Objective Assessment:   39 yo with abd pain. Hx of metastatic lung CA.     Action/Plan:   From home with spouse   Anticipated DC Date:  04/03/2014   Anticipated DC Plan:  [place]  CM consult      Choice offered to / List presented to:     DME arranged  HOSPITAL BED      DME agency  [facility] [facility].        Status of service:  Completed, signed off Medicare Important Message given?   (If response is "NO", the following Medicare IM given date fields will be blank) Date Medicare IM given:   Medicare IM given by:   Date Additional Medicare IM given:   Additional Medicare IM given by:    Discharge Disposition:    Per UR Regulation:  Reviewed for med. necessity/level of care/duration of stay  If discussed at [place] of Stay Meetings, dates discussed:    Comments:  04/03/14 Marney Doctor RN,BSN,NCM [phone] MD placed order for hospital bed.  Pt to dc to home today. [place] DME rep given referral, and pt alerted that [facility] would be contacting him for delivery time.  Pt states that his best phone number is [phone].  No other CM needs noted.

## 2014-04-03 NOTE — Discharge Instructions (Signed)

## 2014-04-03 NOTE — Plan of Care (Addendum)
Problem: Phase I Progression Outcomes Goal: Pain controlled with appropriate interventions Outcome: Progressing Obtained order for oxyfast prn while IV line was out. Patient requested liquid pain med not tablets or pills.

## 2014-04-03 NOTE — Progress Notes (Signed)
Patient feels he "is taking too much medication" and at first refused to take any of his hs medications. Did convince patient the importance of taking his decadron and keppra so he agreed to take those two but nothing else. Patient also refusing his SCD's.

## 2014-04-03 NOTE — Discharge Summary (Signed)
Physician Discharge Summary  Terrance Clark KYH:[id number] DOB: [birth year] DOA: 04/02/2014  PCP: No PCP Per Patient  Admit date: 04/02/2014 Discharge date: 04/03/2014  Recommendations for Outpatient Follow-up:  1. Patient instructed to follow-up at [facility], to schedule an appointment with Dr. Earlie Server as soon as possible. Patient has no plans to return to [place] for treatments there. He will stay in [place] area and will make an appointment with Dr. Earlie Server.  Discharge Diagnoses:  Active Problems:   Seizures   Protein-calorie malnutrition, severe   Thrombosis- extensive   Abdominal pain   Cancer related pain   Hyponatremia   Metastatic lung cancer (metastasis from lung to other site)   Cancer associated pain   Failure to thrive in adult    Discharge Condition: stable; also note, patient is asking to minimize his overall medication regimen. He did not want to take gabapentin or Reglan so we stopped those.  Diet recommendation: as tolerated   History of present illness:  39 year old male with history of metastatic lung cancer, being treated at present at [place], recent admission for seizure. He presented to [facility] ED because of worsening abdominal pain. The girlfriend at the bedside reports they were supposed to go back to [place] but he did not feel good for which reason they decided to come to emergency room. He has nausea but no vomiting. CT abdomen did show ascites, pleural effusion and mild pericardial effusion. Patient was given IV Dilaudid and admitted for pain control.   His pain medications were adjusted and pain seems to be controlled with regimen that includes fentanyl patch, oxycodone as needed every 4-6 hours and then Dilaudid for breakthrough pain.  Assessment/Plan:    Active Problems: Abdominal pain related to metastatic disease in the liver, adrenal glands, skeleton and lungs - Scan on the admission showed progressive metastatic disease to the  liver, adrenal glands, lungs and skeleton. - As mentioned above, pain seems to be controlled with fentanyl patch, oxycodone as needed every 4-6 hours and Dilaudid as needed for breakthrough pain.   History of seizures - Patient will continue Decadron and Keppra as previously prescribed - Please noted that he is on Protonix.  DVT Prophylaxis  - he is on fondaparinux   Code Status: DNR/DNI Family Communication: plan of care discussed with the patient   IV access:  Peripheral IV  Procedures and diagnostic studies:   Ct Abdomen Pelvis W Contrast 04/02/2014 1. No definitive cause of lower abdominal pain. 2. Progressive metastatic disease to the liver, adrenal glands, lungs, and skeleton. 3. Ascites, pleural effusions, and small pericardial effusion.   Dg Chest Port 1 View 04/02/2014 Shifting of right-sided pleural effusion. Bilateral pulmonary nodules. Pulmonary vascular congestion. Interstitial spread of tumor may contribute to this appearance.   Medical Consultants:  None   Other Consultants:  None   IAnti-Infectives:   None    Signed:  Leisa Lenz, MD  [facility] 04/03/2014, 12:02 PM  Pager #: [phone]   Discharge Exam: Filed Vitals:   04/03/14 0538  BP: 110/75  Pulse: 96  Temp: 98.4 F (36.9 C)  Resp: 18   Filed Vitals:   04/02/14 1330 04/02/14 2040 04/02/14 2114 04/03/14 0538  BP: 86/70  104/73 110/75  Pulse: 110 100 93 96  Temp: 98.1 F (36.7 C)  98 F (36.7 C) 98.4 F (36.9 C)  TempSrc: Oral  Oral Oral  Resp: 16  16 18   Height:      Weight:  SpO2: 90%  94% 97%    General: Pt is not in acute distress Cardiovascular: Regular rate and rhythm, S1/S2 (+) Abdominal: non distended, bowel sounds +, no guarding Extremities: no edema, pulses palpable bilaterally DP and PT Neuro: No focal neurological deficits  Discharge Instructions  Discharge Instructions    Call MD for:  difficulty breathing, headache or  visual disturbances    Complete by:  As directed      Call MD for:  persistant dizziness or light-headedness    Complete by:  As directed      Call MD for:  persistant nausea and vomiting    Complete by:  As directed      Call MD for:  severe uncontrolled pain    Complete by:  As directed      Diet - low sodium heart healthy    Complete by:  As directed      Discharge instructions    Complete by:  As directed   1. Please call Cancer center to schedule your appt as soon as possible.     Increase activity slowly    Complete by:  As directed             Medication List    STOP taking these medications        cyclobenzaprine 10 MG tablet  Commonly known as:  FLEXERIL     docusate sodium 100 MG capsule  Commonly known as:  COLACE     gabapentin 300 MG capsule  Commonly known as:  NEURONTIN     metoCLOPramide 10 MG tablet  Commonly known as:  REGLAN     morphine 30 MG 12 hr tablet  Commonly known as:  MS CONTIN     oxyCODONE 5 MG immediate release tablet  Commonly known as:  Oxy IR/ROXICODONE  Replaced by:  oxyCODONE 20 MG/ML concentrated solution     oxyCODONE-acetaminophen 10-325 MG per tablet  Commonly known as:  PERCOCET      TAKE these medications        acetaminophen 325 MG tablet  Commonly known as:  TYLENOL  Take 2 tablets (650 mg total) by mouth every 6 (six) hours as needed for mild pain (or Fever >/= 101).     albuterol 108 (90 BASE) MCG/ACT inhaler  Commonly known as:  PROVENTIL HFA;VENTOLIN HFA  Inhale 2 puffs into the lungs every 6 (six) hours as needed for wheezing or shortness of breath.     dexamethasone 4 MG tablet  Commonly known as:  DECADRON  Take 1 tablet (4 mg total) by mouth 3 (three) times daily.     feeding supplement (ENSURE COMPLETE) Liqd  Take 237 mLs by mouth 3 (three) times daily between meals.     fentaNYL 75 MCG/HR  Commonly known as:  DURAGESIC - dosed mcg/hr  Place 150 mcg onto the skin every 3 (three) days.      fondaparinux 7.5 MG/0.6ML Soln injection  Commonly known as:  ARIXTRA  Inject 0.6 mLs (7.5 mg total) into the skin daily.     HYDROmorphone 4 MG tablet  Commonly known as:  DILAUDID  Take 1 tablet (4 mg total) by mouth every 4 (four) hours as needed for severe pain.     levETIRAcetam 500 MG tablet  Commonly known as:  KEPPRA  Take 1 tablet (500 mg total) by mouth 2 (two) times daily.     ondansetron 4 MG tablet  Commonly known as:  ZOFRAN  Take 1 tablet (  4 mg total) by mouth every 6 (six) hours as needed for nausea.     oxyCODONE 20 MG/ML concentrated solution  Commonly known as:  ROXICODONE INTENSOL  Take 0.3-0.5 mLs (6-10 mg total) by mouth every 4 (four) hours as needed for moderate pain.     pantoprazole 40 MG tablet  Commonly known as:  PROTONIX  Take 40 mg by mouth daily.     potassium chloride SA 20 MEQ tablet  Commonly known as:  K-DUR,KLOR-CON  Take 20 mEq by mouth daily.          The results of significant diagnostics from this hospitalization (including imaging, microbiology, ancillary and laboratory) are listed below for reference.    Significant Diagnostic Studies: Dg Chest 2 View  03/09/2014   CLINICAL DATA:  Lung cancer. Shortness of breath, poor oral intake, nausea.  EXAM: CHEST  2 VIEW  COMPARISON:  03/08/2014  FINDINGS: Stable moderate right pleural effusion with right lower lobe opacity, likely atelectasis. Bilateral pulmonary nodularity again noted. Heart is normal size. Small left pleural effusion noted. No acute bony abnormality.  IMPRESSION: Stable exam since earlier today. Moderate right pleural effusion and small left pleural effusion. Right lower lobe atelectasis. Bilateral pulmonary nodules.   Electronically Signed   By: Rolm Baptise M.D.   On: 03/09/2014 01:19   Ct Head Wo Contrast   (if New Onset Seizure And/or Head Trauma)  03/08/2014   CLINICAL DATA:  Metastatic lung cancer, sudden onset of seizure today.  EXAM: CT HEAD WITHOUT CONTRAST   TECHNIQUE: Contiguous axial images were obtained from the base of the skull through the vertex without intravenous contrast.  COMPARISON:  Brain MRI 08/15/2013  FINDINGS: No midline shift is noted. No destructive bony lesion. No acute fractures. Paranasal sinuses and mastoid air cells are unremarkable.  In axial image 22 there is high density probable hemorrhagic metastasis in right parietal lobe with surrounding vasogenic edema. The lesion measures about 1.2 cm. There is no evidence of active surrounding hemorrhage or intraventricular hemorrhage.  In axial image 28 there is serpiginous high density material in right parietal lobe high convexity with gyral disposition measures about 75 Hounsfield units in attenuation. 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 Further correlation with MRI is recommended.  IMPRESSION: In axial image 22 there is high density probable hemorrhagic metastasis in right parietal lobe with surrounding vasogenic edema. The lesion measures about 1.2 cm. There is no evidence of active surrounding hemorrhage or intraventricular hemorrhage.  In axial image 28 there is serpiginous high density material in right parietal lobe high convexity with gyral disposition measures about 75 Hounsfield units in attenuation. 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 Further correlation with MRI is recommended.  These results were called by telephone at the time of interpretation on 03/08/2014 at 6:14 pm to Dr. Dorie Rank , who verbally acknowledged these results.   Electronically Signed   By: Lahoma Crocker M.D.   On: 03/08/2014 18:15   Mr Jeri Cos GU Contrast  03/09/2014   CLINICAL DATA:  Metastatic lung cancer.  Seizure.  Abnormal CT  EXAM: MRI HEAD WITHOUT AND WITH CONTRAST  TECHNIQUE: Multiplanar,  multiecho pulse sequences of the brain and surrounding structures were obtained without and with intravenous contrast.  CONTRAST:  44mL MULTIHANCE GADOBENATE DIMEGLUMINE 529 MG/ML IV SOLN  COMPARISON:  CT head  03/08/2014, MRI 08/15/2013, 07/04/2013  FINDINGS: Image quality degraded by mild motion.  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 Large hemorrhagic lesion in the right parietal lobe measures 19 x 30 x 30 mm with surrounding white matter edema. Adjacent to this is a chronic treated lesion which is stable. New lesions are present in the left parietal lobe, right parietal lobe, left basal ganglia, right posterior lateral basal ganglia, right temporoparietal lobe, and right cerebellum. These lesions all measure under 5 mm aside from the large lesion in the right parietal lobe. Several small lesions seen on prior studies have been treated and are difficult to see due to interval improvement as well as motion.  4 mm enhancing lesion in the spinal cord at the C3 level consistent with metastatic disease. This is not seen on the MRI of 12/17/2013.  Ventricle size is normal. Mild mass effect on the right atrium from the large lesion in the right parietal lobe. No shift of the midline structures.  Negative for acute ischemic infarction.  IMPRESSION: New hemorrhagic lesion in the right parietal lobe measuring 19 x 30 x 30 mm. This shows evidence of mild hemorrhage and has moderate surrounding edema. This is adjacent to a chronic treated metastatic deposit which is stable.  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 Small lesion in the spinal cord at the C3 level consistent with metastatic disease.   Electronically Signed   By: Franchot Gallo M.D.   On: 03/09/2014 16:53   Ct Abdomen Pelvis W Contrast  04/02/2014   CLINICAL DATA:  Metastatic lung cancer with bilateral lower abdominal pain.  EXAM: CT ABDOMEN AND PELVIS WITH CONTRAST  TECHNIQUE: Multidetector CT imaging of the abdomen and pelvis  was performed using the standard protocol following bolus administration of intravenous contrast.  CONTRAST:  14mL OMNIPAQUE IOHEXOL 300 MG/ML  SOLN  COMPARISON:  12/10/2013  FINDINGS: BODY WALL: Cachexia. There is a cystic appearing structure in the right inguinal canal measuring 18 mm. This is favored to represent ascitic fluid rather than a soft tissue metastasis given location in the canal.  LOWER CHEST: There is a moderate to large right pleural effusion which is layering. This fluid is new from comparison. Small nodules are noted in the bilateral lower lungs compatible with metastatic disease. There is a trace left pleural effusion. Small, water density pericardial effusion.  ABDOMEN/PELVIS:  Liver: Innumerable hypoenhancing liver masses which have progressed in number and size from prior. The largest area of metastatic involvement is in the left liver where there is a 6 cm confluent metastasis.  Biliary: No evidence of biliary obstruction or stone.  Pancreas: Unremarkable.  Spleen: Unremarkable.  Adrenals: Bilateral adrenal metastases with interval enlargement. The right adrenal gland now measures 7 x 4.2 cm, previously 6 x 3.2 cm.  Kidneys and ureters: No hydronephrosis or stone.  Bladder: Unremarkable.  Reproductive: Unremarkable.  Bowel: No obstruction.  Retroperitoneum: No discrete mass.  Peritoneum: New small volume ascites, mainly located in the pelvis.  Vascular: No acute abnormality.  OSSEOUS: Sclerotic and lytic metastases are present throughout the skeleton. No definitive spinal canal or other extraosseous spread. No pathologic fracture to explain pain.  IMPRESSION: 1. No definitive cause of lower abdominal pain. 2. Progressive metastatic disease to the liver, adrenal glands, lungs, and skeleton. 3. Ascites, pleural effusions, and small pericardial effusion.   Electronically Signed   By: Jorje Guild M.D.   On: 04/02/2014 05:04   Dg Chest [facility] [address]  04/02/2014   CLINICAL DATA:  39 year old  male with history of lung cancer with metastatic disease. Follow-up pleural effusions. Subsequent encounter.  EXAM: PORTABLE CHEST - 1 VIEW  COMPARISON:  03/09/2014 chest x-ray.  FINDINGS: Shifting of right-sided pleural effusion located inferior lateral and inferiorly.  Bilateral pulmonary nodules.  Pulmonary vascular congestion. Underlying interstitial spread of tumor is a possibility.  No gross pneumothorax.  Heart size within normal limits.  IMPRESSION: Shifting of right-sided pleural effusion.  Bilateral pulmonary nodules.  Pulmonary vascular congestion. Interstitial spread of tumor may contribute to this appearance.   Electronically Signed   By: Chauncey Cruel M.D.   On: 04/02/2014 08:08   Dg Chest Port 1 View  03/08/2014   CLINICAL DATA:  Weakness and fever, history of lung carcinoma  EXAM: PORTABLE CHEST - 1 VIEW  COMPARISON:  12/22/2013  FINDINGS: Cardiac shadow is stable from the prior exam. A new right-sided pleural effusion and likely right basilar infiltrate is noted. Slightly more prominent pulmonary nodules are noted bilaterally consistent with the patient's given clinical history.  IMPRESSION: Increase in the degree of pulmonary nodularity when compare with the prior exam.  New right basilar effusion with likely underlying infiltrate.   Electronically Signed   By: Inez Catalina M.D.   On: 03/08/2014 17:51    Microbiology: No results found for this or any previous visit (from the past 240 hour(s)).   Labs: Basic Metabolic Panel:  Recent Labs Lab 04/02/14 0305 04/02/14 1100  NA 130* 130*  K 3.8 4.0  CL 94* 96  CO2 25 26  GLUCOSE 84 75  BUN 12 13  CREATININE 0.46* 0.47*  CALCIUM 8.2* 8.3*   Liver Function Tests:  Recent Labs Lab 04/02/14 0305  AST 44*  ALT 30  ALKPHOS 246*  BILITOT 0.6  PROT 6.1  ALBUMIN 2.9*   No results for input(s): LIPASE, AMYLASE in the last 168 hours. No results for input(s): AMMONIA in the last 168 hours. CBC:  Recent Labs Lab  04/02/14 0305  WBC 9.4  NEUTROABS 8.1*  HGB 11.7*  HCT 35.3*  MCV 85.5  PLT 163   Cardiac Enzymes: No results for input(s): CKTOTAL, CKMB, CKMBINDEX, TROPONINI in the last 168 hours. BNP: BNP (last 3 results) No results for input(s): PROBNP in the last 8760 hours. CBG: No results for input(s): GLUCAP in the last 168 hours.  Time coordinating discharge: Over 30 minutes

## 2014-04-05 ENCOUNTER — Emergency Department (HOSPITAL_COMMUNITY): Payer: 59

## 2014-04-05 ENCOUNTER — Encounter (HOSPITAL_COMMUNITY): Payer: Self-pay | Admitting: Emergency Medicine

## 2014-04-05 ENCOUNTER — Inpatient Hospital Stay (HOSPITAL_COMMUNITY)
Admission: EM | Admit: 2014-04-05 | Discharge: 2014-04-08 | DRG: 947 | Disposition: A | Discharge status: Home / Self Care | Payer: 59 | Attending: Internal Medicine | Admitting: Internal Medicine

## 2014-04-05 DIAGNOSIS — R05 Cough: Secondary | ICD-10-CM

## 2014-04-05 DIAGNOSIS — R109 Unspecified abdominal pain: Secondary | ICD-10-CM | POA: Diagnosis not present

## 2014-04-05 DIAGNOSIS — R531 Weakness: Secondary | ICD-10-CM | POA: Insufficient documentation

## 2014-04-05 DIAGNOSIS — J90 Pleural effusion, not elsewhere classified: Secondary | ICD-10-CM | POA: Diagnosis present

## 2014-04-05 DIAGNOSIS — E860 Dehydration: Secondary | ICD-10-CM | POA: Diagnosis present

## 2014-04-05 DIAGNOSIS — G4700 Insomnia, unspecified: Secondary | ICD-10-CM | POA: Diagnosis present

## 2014-04-05 DIAGNOSIS — G893 Neoplasm related pain (acute) (chronic): Principal | ICD-10-CM | POA: Diagnosis present

## 2014-04-05 DIAGNOSIS — E43 Unspecified severe protein-calorie malnutrition: Secondary | ICD-10-CM | POA: Diagnosis present

## 2014-04-05 DIAGNOSIS — Z923 Personal history of irradiation: Secondary | ICD-10-CM

## 2014-04-05 DIAGNOSIS — C7800 Secondary malignant neoplasm of unspecified lung: Secondary | ICD-10-CM

## 2014-04-05 DIAGNOSIS — C7951 Secondary malignant neoplasm of bone: Secondary | ICD-10-CM | POA: Diagnosis present

## 2014-04-05 DIAGNOSIS — Z515 Encounter for palliative care: Secondary | ICD-10-CM

## 2014-04-05 DIAGNOSIS — I313 Pericardial effusion (noninflammatory): Secondary | ICD-10-CM | POA: Diagnosis present

## 2014-04-05 DIAGNOSIS — R627 Adult failure to thrive: Secondary | ICD-10-CM | POA: Diagnosis present

## 2014-04-05 DIAGNOSIS — G936 Cerebral edema: Secondary | ICD-10-CM | POA: Diagnosis present

## 2014-04-05 DIAGNOSIS — C7801 Secondary malignant neoplasm of right lung: Secondary | ICD-10-CM | POA: Diagnosis present

## 2014-04-05 DIAGNOSIS — C7802 Secondary malignant neoplasm of left lung: Secondary | ICD-10-CM | POA: Diagnosis present

## 2014-04-05 DIAGNOSIS — F17210 Nicotine dependence, cigarettes, uncomplicated: Secondary | ICD-10-CM | POA: Diagnosis present

## 2014-04-05 DIAGNOSIS — Z9119 Patient's noncompliance with other medical treatment and regimen: Secondary | ICD-10-CM | POA: Diagnosis present

## 2014-04-05 DIAGNOSIS — C7931 Secondary malignant neoplasm of brain: Secondary | ICD-10-CM | POA: Diagnosis present

## 2014-04-05 DIAGNOSIS — Z66 Do not resuscitate: Secondary | ICD-10-CM | POA: Diagnosis present

## 2014-04-05 DIAGNOSIS — C787 Secondary malignant neoplasm of liver and intrahepatic bile duct: Secondary | ICD-10-CM | POA: Diagnosis present

## 2014-04-05 DIAGNOSIS — R1084 Generalized abdominal pain: Secondary | ICD-10-CM | POA: Diagnosis present

## 2014-04-05 DIAGNOSIS — O26899 Other specified pregnancy related conditions, unspecified trimester: Secondary | ICD-10-CM

## 2014-04-05 DIAGNOSIS — R059 Cough, unspecified: Secondary | ICD-10-CM

## 2014-04-05 DIAGNOSIS — C799 Secondary malignant neoplasm of unspecified site: Secondary | ICD-10-CM | POA: Diagnosis present

## 2014-04-05 DIAGNOSIS — Z681 Body mass index (BMI) 19 or less, adult: Secondary | ICD-10-CM | POA: Diagnosis not present

## 2014-04-05 DIAGNOSIS — C7970 Secondary malignant neoplasm of unspecified adrenal gland: Secondary | ICD-10-CM | POA: Diagnosis present

## 2014-04-05 DIAGNOSIS — C801 Malignant (primary) neoplasm, unspecified: Secondary | ICD-10-CM

## 2014-04-05 DIAGNOSIS — C3490 Malignant neoplasm of unspecified part of unspecified bronchus or lung: Secondary | ICD-10-CM | POA: Diagnosis present

## 2014-04-05 LAB — URINALYSIS, ROUTINE W REFLEX MICROSCOPIC
Bilirubin Urine: NEGATIVE
Glucose, UA: NEGATIVE mg/dL
HGB URINE DIPSTICK: NEGATIVE
Ketones, ur: NEGATIVE mg/dL
LEUKOCYTES UA: NEGATIVE
Nitrite: NEGATIVE
PROTEIN: NEGATIVE mg/dL
Specific Gravity, Urine: 1.022 (ref 1.005–1.030)
Urobilinogen, UA: 1 mg/dL (ref 0.0–1.0)
pH: 7.5 (ref 5.0–8.0)

## 2014-04-05 LAB — COMPREHENSIVE METABOLIC PANEL WITH GFR
ALT: 26 U/L (ref 0–53)
AST: 32 U/L (ref 0–37)
Albumin: 3.3 g/dL — ABNORMAL LOW (ref 3.5–5.2)
Alkaline Phosphatase: 288 U/L — ABNORMAL HIGH (ref 39–117)
Anion gap: 9 (ref 5–15)
BUN: 15 mg/dL (ref 6–23)
CO2: 28 mmol/L (ref 19–32)
Calcium: 9 mg/dL (ref 8.4–10.5)
Chloride: 98 mmol/L (ref 96–112)
Creatinine, Ser: 0.59 mg/dL (ref 0.50–1.35)
GFR calc Af Amer: >90 mL/min
GFR calc non Af Amer: >90 mL/min
Glucose, Bld: 84 mg/dL (ref 70–99)
Potassium: 3.9 mmol/L (ref 3.5–5.1)
Sodium: 135 mmol/L (ref 135–145)
Total Bilirubin: 0.2 mg/dL — ABNORMAL LOW (ref 0.3–1.2)
Total Protein: 7 g/dL (ref 6.0–8.3)

## 2014-04-05 LAB — CBC WITH DIFFERENTIAL/PLATELET
Basophils Absolute: 0 10*3/uL (ref 0.0–0.1)
Basophils Relative: 0 % (ref 0–1)
Eosinophils Absolute: 0 10*3/uL (ref 0.0–0.7)
Eosinophils Relative: 0 % (ref 0–5)
HCT: 40.8 % (ref 39.0–52.0)
Hemoglobin: 13.1 g/dL (ref 13.0–17.0)
Lymphocytes Relative: 9 % — ABNORMAL LOW (ref 12–46)
Lymphs Abs: 0.8 10*3/uL (ref 0.7–4.0)
MCH: 28.1 pg (ref 26.0–34.0)
MCHC: 32.1 g/dL (ref 30.0–36.0)
MCV: 87.6 fL (ref 78.0–100.0)
MONOS PCT: 10 % (ref 3–12)
Monocytes Absolute: 0.9 10*3/uL (ref 0.1–1.0)
Neutro Abs: 7 10*3/uL (ref 1.7–7.7)
Neutrophils Relative %: 81 % — ABNORMAL HIGH (ref 43–77)
Platelets: 205 10*3/uL (ref 150–400)
RBC: 4.66 MIL/uL (ref 4.22–5.81)
RDW: 16.9 % — AB (ref 11.5–15.5)
WBC: 8.6 10*3/uL (ref 4.0–10.5)

## 2014-04-05 LAB — I-STAT CG4 LACTIC ACID, ED: Lactic Acid, Venous: 3.05 mmol/L (ref 0.5–2.0)

## 2014-04-05 MED ORDER — FONDAPARINUX SODIUM 5 MG/0.4ML ~~LOC~~ SOLN
5.0000 mg | SUBCUTANEOUS | Status: DC
  Administered 2014-04-05 – 2014-04-07 (×3): 5 mg via SUBCUTANEOUS
  Filled 2014-04-05 (×4): qty 0.4

## 2014-04-05 MED ORDER — FONDAPARINUX SODIUM 5 MG/0.4ML ~~LOC~~ SOLN
5.0000 mg | SUBCUTANEOUS | Status: DC
  Filled 2014-04-05: qty 0.4

## 2014-04-05 MED ORDER — ACETAMINOPHEN 325 MG PO TABS
650.0000 mg | ORAL_TABLET | Freq: Four times a day (QID) | ORAL | Status: DC | PRN
Start: 2014-04-05 — End: 2014-04-08

## 2014-04-05 MED ORDER — PANTOPRAZOLE SODIUM 40 MG PO TBEC
40.0000 mg | DELAYED_RELEASE_TABLET | Freq: Every day | ORAL | Status: DC
  Administered 2014-04-05 – 2014-04-08 (×4): 40 mg via ORAL
  Filled 2014-04-05 (×4): qty 1

## 2014-04-05 MED ORDER — ENSURE COMPLETE PO LIQD
237.0000 mL | Freq: Three times a day (TID) | ORAL | Status: DC
  Administered 2014-04-06 – 2014-04-08 (×4): 237 mL via ORAL

## 2014-04-05 MED ORDER — DEXAMETHASONE 4 MG PO TABS
4.0000 mg | ORAL_TABLET | Freq: Three times a day (TID) | ORAL | Status: DC
  Administered 2014-04-05 – 2014-04-06 (×4): 4 mg via ORAL
  Filled 2014-04-05 (×7): qty 1

## 2014-04-05 MED ORDER — OXYCODONE HCL 20 MG/ML PO CONC: 10.0000 mg | ORAL | Status: DC | PRN

## 2014-04-05 MED ORDER — ACETAMINOPHEN 325 MG PO TABS: 650.0000 mg | ORAL_TABLET | Freq: Four times a day (QID) | ORAL | Status: DC | PRN

## 2014-04-05 MED ORDER — POTASSIUM CHLORIDE CRYS ER 20 MEQ PO TBCR
20.0000 meq | EXTENDED_RELEASE_TABLET | Freq: Every day | ORAL | Status: DC
  Administered 2014-04-05 – 2014-04-08 (×4): 20 meq via ORAL
  Filled 2014-04-05 (×4): qty 1

## 2014-04-05 MED ORDER — FONDAPARINUX SODIUM 7.5 MG/0.6ML ~~LOC~~ SOLN
7.5000 mg | SUBCUTANEOUS | Status: DC
  Filled 2014-04-05: qty 0.6

## 2014-04-05 MED ORDER — FENTANYL CITRATE 0.05 MG/ML IJ SOLN
50.0000 ug | Freq: Once | INTRAMUSCULAR | Status: DC
Start: 2014-04-05 — End: 2014-04-05
  Filled 2014-04-05: qty 2

## 2014-04-05 MED ORDER — ACETAMINOPHEN 650 MG RE SUPP: 650.0000 mg | Freq: Four times a day (QID) | RECTAL | Status: DC | PRN

## 2014-04-05 MED ORDER — ONDANSETRON HCL 4 MG/2ML IJ SOLN
4.0000 mg | Freq: Once | INTRAMUSCULAR | Status: AC
  Administered 2014-04-05: 4 mg via INTRAVENOUS
  Filled 2014-04-05: qty 2

## 2014-04-05 MED ORDER — ALBUTEROL SULFATE (2.5 MG/3ML) 0.083% IN NEBU: 3.0000 mL | INHALATION_SOLUTION | Freq: Four times a day (QID) | RESPIRATORY_TRACT | Status: DC | PRN

## 2014-04-05 MED ORDER — HYDROMORPHONE HCL 1 MG/ML IJ SOLN
1.0000 mg | Freq: Once | INTRAMUSCULAR | Status: AC
  Administered 2014-04-05: 1 mg via INTRAVENOUS
  Filled 2014-04-05: qty 1

## 2014-04-05 MED ORDER — OXYCODONE HCL 20 MG/ML PO CONC: 5.0000 mg | ORAL | Status: DC | PRN

## 2014-04-05 MED ORDER — HYDROMORPHONE HCL 2 MG/ML IJ SOLN
2.0000 mg | INTRAMUSCULAR | Status: DC | PRN
  Administered 2014-04-05 – 2014-04-06 (×7): 2 mg via INTRAVENOUS
  Filled 2014-04-05 (×7): qty 1

## 2014-04-05 MED ORDER — SODIUM CHLORIDE 0.9 % IV SOLN
INTRAVENOUS | Status: DC
  Administered 2014-04-05 – 2014-04-08 (×3): via INTRAVENOUS

## 2014-04-05 MED ORDER — FENTANYL 75 MCG/HR TD PT72
150.0000 ug | MEDICATED_PATCH | TRANSDERMAL | Status: DC
  Administered 2014-04-05: 150 ug via TRANSDERMAL
  Filled 2014-04-05: qty 2

## 2014-04-05 MED ORDER — HYDROMORPHONE HCL 2 MG/ML IJ SOLN
2.0000 mg | Freq: Once | INTRAMUSCULAR | Status: AC
  Administered 2014-04-05: 2 mg via INTRAVENOUS
  Filled 2014-04-05: qty 1

## 2014-04-05 MED ORDER — LEVETIRACETAM 500 MG PO TABS
500.0000 mg | ORAL_TABLET | Freq: Two times a day (BID) | ORAL | Status: DC
  Administered 2014-04-05 – 2014-04-06 (×3): 500 mg via ORAL
  Filled 2014-04-05 (×5): qty 1

## 2014-04-05 MED ORDER — ONDANSETRON HCL 4 MG PO TABS: 4.0000 mg | ORAL_TABLET | Freq: Four times a day (QID) | ORAL | Status: DC | PRN

## 2014-04-05 MED ORDER — ONDANSETRON HCL 4 MG/2ML IJ SOLN
4.0000 mg | Freq: Four times a day (QID) | INTRAMUSCULAR | Status: DC | PRN
Start: 2014-04-05 — End: 2014-04-08
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
## 2014-04-05 NOTE — ED Notes (Signed)
I gave I stat CG4 Lactic Acid critical  result to MD Eulis Foster.

## 2014-04-05 NOTE — ED Notes (Signed)
Pt states he's having the same pain he's had for the last year. Starting in the lower front abd and radiating around to the back bilaterally, states he was just discharged recently for the same problem
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
## 2014-04-06 DIAGNOSIS — E43 Unspecified severe protein-calorie malnutrition: Secondary | ICD-10-CM

## 2014-04-06 DIAGNOSIS — E860 Dehydration: Secondary | ICD-10-CM

## 2014-04-06 DIAGNOSIS — Z515 Encounter for palliative care: Secondary | ICD-10-CM

## 2014-04-06 LAB — CBC
HEMATOCRIT: 34.8 % — AB (ref 39.0–52.0)
Hemoglobin: 11.1 g/dL — ABNORMAL LOW (ref 13.0–17.0)
MCH: 27.9 pg (ref 26.0–34.0)
MCHC: 31.9 g/dL (ref 30.0–36.0)
MCV: 87.4 fL (ref 78.0–100.0)
Platelets: 175 10*3/uL (ref 150–400)
RBC: 3.98 MIL/uL — ABNORMAL LOW (ref 4.22–5.81)
RDW: 16.8 % — ABNORMAL HIGH (ref 11.5–15.5)
WBC: 9.2 10*3/uL (ref 4.0–10.5)

## 2014-04-06 LAB — COMPREHENSIVE METABOLIC PANEL
ALT: 22 U/L (ref 0–53)
ANION GAP: 11 (ref 5–15)
AST: 27 U/L (ref 0–37)
Albumin: 2.6 g/dL — ABNORMAL LOW (ref 3.5–5.2)
Alkaline Phosphatase: 236 U/L — ABNORMAL HIGH (ref 39–117)
BUN: 12 mg/dL (ref 6–23)
CO2: 24 mmol/L (ref 19–32)
Calcium: 8.3 mg/dL — ABNORMAL LOW (ref 8.4–10.5)
Chloride: 98 mmol/L (ref 96–112)
Creatinine, Ser: 0.43 mg/dL — ABNORMAL LOW (ref 0.50–1.35)
GFR calc Af Amer: >90 mL/min (ref >90)
GFR calc non Af Amer: >90 mL/min (ref >90)
Glucose, Bld: 95 mg/dL (ref 70–99)
Potassium: 4.3 mmol/L (ref 3.5–5.1)
Sodium: 133 mmol/L — ABNORMAL LOW (ref 135–145)
Total Bilirubin: 0.3 mg/dL (ref 0.3–1.2)
Total Protein: 5.7 g/dL — ABNORMAL LOW (ref 6.0–8.3)

## 2014-04-06 LAB — GLUCOSE, CAPILLARY: GLUCOSE-CAPILLARY: 102 mg/dL — AB (ref 70–99)

## 2014-04-06 MED ORDER — HYDROMORPHONE HCL 1 MG/ML IJ SOLN
0.5000 mg | INTRAMUSCULAR | Status: DC | PRN
  Administered 2014-04-07: 0.5 mg via INTRAVENOUS
  Filled 2014-04-06: qty 1

## 2014-04-06 MED ORDER — MORPHINE SULFATE 15 MG PO TABS
15.0000 mg | ORAL_TABLET | ORAL | Status: DC | PRN
  Administered 2014-04-06 (×2): 15 mg via ORAL
  Filled 2014-04-06 (×2): qty 1

## 2014-04-06 MED ORDER — SENNOSIDES-DOCUSATE SODIUM 8.6-50 MG PO TABS: 2.0000 | ORAL_TABLET | Freq: Every evening | ORAL | Status: DC | PRN

## 2014-04-06 MED ORDER — MORPHINE SULFATE 15 MG PO TABS
15.0000 mg | ORAL_TABLET | ORAL | Status: DC | PRN
  Administered 2014-04-06: 15 mg via ORAL
  Filled 2014-04-06: qty 1

## 2014-04-06 MED ORDER — MORPHINE SULFATE ER 30 MG PO TBCR
60.0000 mg | EXTENDED_RELEASE_TABLET | Freq: Two times a day (BID) | ORAL | Status: DC
  Administered 2014-04-06 (×2): 60 mg via ORAL
  Filled 2014-04-06 (×2): qty 2

## 2014-04-06 NOTE — Care Management Note (Signed)
CARE MANAGEMENT NOTE 04/06/2014  Patient:  [facility]   Account Number:  [id number]  Date Initiated:  04/06/2014  Documentation initiated by:  Marney Doctor  Subjective/Objective Assessment:   39 yo admitted with cancer associated pain     Action/Plan:   From home with spouse   Anticipated DC Date:  04/08/2014   Anticipated DC Plan:  [place]  CM consult      Choice offered to / List presented to:             Status of service:  In process, will continue to follow Medicare Important Message given?   (If response is "NO", the following Medicare IM given date fields will be blank) Date Medicare IM given:   Medicare IM given by:   Date Additional Medicare IM given:   Additional Medicare IM given by:    Discharge Disposition:    Per UR Regulation:  Reviewed for med. necessity/level of care/duration of stay  If discussed at [place] of Stay Meetings, dates discussed:    Comments:  04/06/14 Marney Doctor RN,BSN,NCM [phone] Was asked to see pt regarding medication needs.  Pt states that he has trouble with someone else picking up his narcotics at the pharmacy for him.  Pt encouraged to call the pharmacy ahead a let them know someone else would be picking them up and to make sure that person has a photo ID to show.  Pt also states that one of his medications had a $68 dollar co-pay this time and has never had a co-pay before.  Pt encouraged to call the number on the back of his insurance card to inquire about what his medication co-pay should be.  Pt also states that he does not have Medicaid even though it is listed on his facesheet.  CM will continue to follow.

## 2014-04-06 NOTE — Progress Notes (Signed)
CSW received referral for assistance with medications.   Inappropriate CSW referral, RNCM notified.   No further social work needs identified at this time.  Please re-consult if social work needs arise.   CSW signing off.   Alison Murray, MSW, [place] Work [phone]
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
## 2014-04-06 NOTE — Consult Note (Signed)
Patient Terrance Clark      DOB: 1975/08/04      IRC:[id number]     Consult Note from the Palliative Medicine Team at [place] Requested by: Dr Charlies Silvers     PCP: No PCP Per Patient Reason for Consultation: Clarification of Alpha and symptom management     Phone                                                                                                                                                                                   Number:None  Assessment of patients Current state:  Continued physical and functional decline 2/2 to metastatic lung cancer. Recent hospitalization and readmit 2/2 to uncontrolled pain/could not fill due to financial constraints.  Progression of disease and poor prognostic awareness. Struggles with decisions regarding  diagnosis, prognosis and treatment options.  Consult is for review of medical treatment options, clarification of goals of care and end of life issues, disposition and options, and symptom recommendation.  This NP Wadie Lessen reviewed medical records, received report from team, assessed the patient and then meet at the patient's bedside along with his brother Terrance Clark  to discuss diagnosis, prognosis, GOC, disposition and options.  Discussion and education regarding pain management strategies.  Notified Fritzi Mandes SW with the cancer center  (who has worked with this patient prior) to offer assistance and suggestion for assistance with medications, disabilty and other social issues that layer this complicated situation.   We re-visited the discussion regarding advanced directives.    The difference between a aggressive medical intervention path  and a palliative comfort care path for this patient at this time was had.  Values and goals of care important to patient and family were attempted to be elicited.  Concept of Hospice and Palliative Care were discussed  Questions and concerns addressed.   PMT will continue to support  holistically.   Goals of Care: 1.  Code Status:  Full code, patient reversed this from last admission. Encouraged to consider DNR/DNI status knowing poor outcomes in similar patients.    2. Scope of Treatment:  Patient is open to all available and offered medical interventions to prolong life.  Patients plans to f/u with Dr Earlie Server for continued oncology care.  Patient and his brother tell me that patient is presently drinking "HydroO2" an antioxidant purified water that proclaims to heal disease and cure cancer  3. Disposition:  Hopeful to return home when pain is adequately controlled , encouraged to consider hospice benefit   4. Symptom Management:   1. Pain: Goal to have pain adequately controlled by oral ageents in home of discharge home and continued f/u  with OP oncology      -dc Fentanyl patch      -initiated Morphine ER 60 mg every 12 hrs scheduled      utilize Moprhine IR 15 mg every 3 hrs prn for breakthrough pain      -make adjustments in am  2. Bowel Regimen: Senna-s 2 tablets po q hs prn  5. Psychosocial: Emotional support offered to patient and his brother   Brief HPI:  Metastatic lung cancer with disease progression to brain, liver and adrenals.  Increasingly difficult pain management layered with psycho-social elements.    ROS: significant abdominal pain described as a "tightening belt" around mid section   PMH:  Past Medical History  Diagnosis Date  . H/O oral surgery 04/2012  . Cough   . Shortness of breath     with exertion  . Insomnia     d/t pain and takes Goody's PM  . Hx of radiation therapy 07/16/12- 09/10/12    lower neck and upper chest region , 63 gray in 35 fx  . Hx of radiation therapy 07/18/12    SRS to brain metastasis  . Lung cancer 06/2012    RUL lung  . 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 Cancer     adrenal mets  . History of blood clots   . Lung cancer   . Seizures      PSH: Past Surgical History  Procedure Laterality Date  . Foot  surgery    . Mouth surgery    . Video bronchoscopy with endobronchial ultrasound Right 06/19/2012    Procedure: VIDEO BRONCHOSCOPY WITH ENDOBRONCHIAL ULTRASOUND;  Surgeon: Collene Gobble, MD;  Location: [place];  Service: Pulmonary;  Laterality: Right;   I have reviewed the [place] and SH and  If appropriate update it with new information. No Known Allergies Scheduled Meds: . dexamethasone  4 mg Oral TID  . feeding supplement (ENSURE COMPLETE)  237 mL Oral TID BM  . fondaparinux (ARIXTRA) injection  5 mg Subcutaneous Q24H  . levETIRAcetam  500 mg Oral BID  . morphine  60 mg Oral Q12H  . pantoprazole  40 mg Oral Daily  . potassium chloride SA  20 mEq Oral Daily   Continuous Infusions: . sodium chloride 75 mL/hr at 04/05/14 2037   PRN Meds:.acetaminophen **OR** acetaminophen, albuterol, HYDROmorphone (DILAUDID) injection, morphine, ondansetron **OR** ondansetron (ZOFRAN) IV    BP 95/66 mmHg  Pulse 86  Temp(Src) 97.4 F (36.3 C) (Oral)  Resp 20  Ht 5\' 11"  (1.803 m)  Wt 48.1 kg (106 lb 0.7 oz)  BMI 14.80 kg/m2  SpO2 100%   PPS: 40 %   Intake/Output Summary (Last 24 hours) at 04/06/14 0942 Last data filed at 04/06/14 0645  Gross per 24 hour  Intake 2243.75 ml  Output    850 ml  Net 1393.75 ml     Stool [place]ner: Senna-s  Physical Exam:  General: chronically ill appearing, NAD, cachectic and weak HEENT:  Moist buccal membranes, no exudate Chest:   CTA CVS: RRR  Labs: CBC    Component Value Date/Time   WBC 9.2 04/06/2014 0445   WBC 7.4 08/29/2013 1136   RBC 3.98* 04/06/2014 0445   RBC 4.61 08/29/2013 1136   HGB 11.1* 04/06/2014 0445   HGB 13.6 08/29/2013 1136   HCT 34.8* 04/06/2014 0445   HCT 41.4 08/29/2013 1136   PLT 175 04/06/2014 0445   PLT 116* 08/29/2013 1136   MCV 87.4 04/06/2014 0445   MCV 89.8  08/29/2013 1136   MCH 27.9 04/06/2014 0445   MCH 29.6 08/29/2013 1136   MCHC 31.9 04/06/2014 0445   MCHC 33.0 08/29/2013 1136   RDW 16.8* 04/06/2014 0445   RDW  15.9* 08/29/2013 1136   LYMPHSABS 0.8 04/05/2014 1441   LYMPHSABS 0.8* 08/29/2013 1136   MONOABS 0.9 04/05/2014 1441   MONOABS 0.4 08/29/2013 1136   EOSABS 0.0 04/05/2014 1441   EOSABS 0.0 08/29/2013 1136   BASOSABS 0.0 04/05/2014 1441   BASOSABS 0.0 08/29/2013 1136    BMET    Component Value Date/Time   NA 133* 04/06/2014 0445   NA 139 08/29/2013 1136   K 4.3 04/06/2014 0445   K 3.4* 08/29/2013 1136   CL 98 04/06/2014 0445   CL 103 09/02/2012 0955   CO2 24 04/06/2014 0445   CO2 22 08/29/2013 1136   GLUCOSE 95 04/06/2014 0445   GLUCOSE 96 08/29/2013 1136   GLUCOSE 85 09/02/2012 0955   BUN 12 04/06/2014 0445   BUN 22.1 08/29/2013 1136   CREATININE 0.43* 04/06/2014 0445   CREATININE 0.9 08/29/2013 1136   CALCIUM 8.3* 04/06/2014 0445   CALCIUM 9.3 08/29/2013 1136   GFRNONAA >90 04/06/2014 0445   GFRAA >90 04/06/2014 0445    CMP     Component Value Date/Time   NA 133* 04/06/2014 0445   NA 139 08/29/2013 1136   K 4.3 04/06/2014 0445   K 3.4* 08/29/2013 1136   CL 98 04/06/2014 0445   CL 103 09/02/2012 0955   CO2 24 04/06/2014 0445   CO2 22 08/29/2013 1136   GLUCOSE 95 04/06/2014 0445   GLUCOSE 96 08/29/2013 1136   GLUCOSE 85 09/02/2012 0955   BUN 12 04/06/2014 0445   BUN 22.1 08/29/2013 1136   CREATININE 0.43* 04/06/2014 0445   CREATININE 0.9 08/29/2013 1136   CALCIUM 8.3* 04/06/2014 0445   CALCIUM 9.3 08/29/2013 1136   PROT 5.7* 04/06/2014 0445   PROT 6.9 08/29/2013 1136   ALBUMIN 2.6* 04/06/2014 0445   ALBUMIN 3.9 08/29/2013 1136   AST 27 04/06/2014 0445   AST 16 08/29/2013 1136   ALT 22 04/06/2014 0445   ALT 38 08/29/2013 1136   ALKPHOS 236* 04/06/2014 0445   ALKPHOS 89 08/29/2013 1136   BILITOT 0.3 04/06/2014 0445   BILITOT 0.21 08/29/2013 1136   GFRNONAA >90 04/06/2014 0445   GFRAA >90 04/06/2014 0445      Time In Time Out Total Time Spent with Patient Total Overall Time  0900 1030 75 min 90 min    Greater than 50%  of this time was spent  counseling and coordinating care related to the above assessment and plan.   Wadie Lessen NP  Palliative Medicine Team Team Phone # [phone] Pager [phone]  Discussed with Dr Charlies Silvers

## 2014-04-07 ENCOUNTER — Encounter: Payer: Self-pay | Admitting: Medical Oncology

## 2014-04-07 ENCOUNTER — Telehealth: Payer: Self-pay | Admitting: *Deleted

## 2014-04-07 DIAGNOSIS — R531 Weakness: Secondary | ICD-10-CM

## 2014-04-07 DIAGNOSIS — R569 Unspecified convulsions: Secondary | ICD-10-CM

## 2014-04-07 LAB — GLUCOSE, CAPILLARY: GLUCOSE-CAPILLARY: 92 mg/dL (ref 70–99)

## 2014-04-07 MED ORDER — MORPHINE SULFATE (CONCENTRATE) 10 MG/0.5ML PO SOLN: 15.0000 mg | ORAL | Status: DC | PRN

## 2014-04-07 MED ORDER — DEXAMETHASONE 1 MG/ML PO CONC: 4.0000 mg | Freq: Three times a day (TID) | ORAL | Status: AC

## 2014-04-07 MED ORDER — DIPHENHYDRAMINE HCL 50 MG/ML IJ SOLN
25.0000 mg | Freq: Once | INTRAMUSCULAR | Status: AC
  Administered 2014-04-07: 25 mg via INTRAVENOUS
  Filled 2014-04-07: qty 1

## 2014-04-07 MED ORDER — MORPHINE SULFATE (CONCENTRATE) 10 MG/0.5ML PO SOLN
15.0000 mg | ORAL | Status: DC | PRN
  Administered 2014-04-07 – 2014-04-08 (×9): 15 mg via ORAL
  Filled 2014-04-07 (×9): qty 1

## 2014-04-07 MED ORDER — MORPHINE SULFATE ER 15 MG PO TBCR: 50.0000 mg | EXTENDED_RELEASE_TABLET | Freq: Three times a day (TID) | ORAL | Status: DC

## 2014-04-07 MED ORDER — DEXAMETHASONE 1 MG/ML PO CONC
4.0000 mg | Freq: Three times a day (TID) | ORAL | Status: DC
  Administered 2014-04-07 – 2014-04-08 (×4): 4 mg via ORAL
  Filled 2014-04-07 (×6): qty 4

## 2014-04-07 MED ORDER — LEVETIRACETAM 100 MG/ML PO SOLN
500.0000 mg | Freq: Two times a day (BID) | ORAL | Status: DC
  Administered 2014-04-07 – 2014-04-08 (×3): 500 mg via ORAL
  Filled 2014-04-07 (×4): qty 5

## 2014-04-07 MED ORDER — LEVETIRACETAM 100 MG/ML PO SOLN: 500.0000 mg | Freq: Two times a day (BID) | ORAL | Status: AC

## 2014-04-07 MED ORDER — MORPHINE SULFATE ER 30 MG PO TBCR
50.0000 mg | EXTENDED_RELEASE_TABLET | Freq: Three times a day (TID) | ORAL | Status: DC
  Administered 2014-04-07 – 2014-04-08 (×3): 45 mg via ORAL
  Filled 2014-04-07 (×6): qty 1

## 2014-04-07 MED ORDER — BISACODYL 10 MG RE SUPP
10.0000 mg | Freq: Every day | RECTAL | Status: DC | PRN
  Administered 2014-04-08: 10 mg via RECTAL
  Filled 2014-04-07: qty 1
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
## 2014-04-07 NOTE — Telephone Encounter (Signed)
Call received from [facility] Lessen mid level for pallative care return call number  [phone].  Stanton Kidney states need for consult for possible treatment options for pt to decide on chemotherapy vs hospice care.  " if Dr Julien Nordmann unable to consult could the on call doctor see patient ?"  Pt was seen previously by Dr Julien Nordmann - then went to [place] and now is inpatient  [facility]- room 1334.  Need for consult to plan discharge.  THIS MESSAGE WILL BE SENT TO MD AND RN AT DESK FOR FOLLOW UP PER ABOVE.

## 2014-04-07 NOTE — Progress Notes (Signed)
Progress Note from the Palliative Medicine Team at [place]:    -meet with patient and SO [name] at bedside  -continued conversation regarding diagnosis, prognosis, GOC and symptom management  -patient understands medication adjustments and plan to dc all IV medications in anticipation for dc tomorrow is symptom controlled.  Patient is having a hard time with "all the pills" will convert any medication that is prepared in a solution to solution form (discussed with pharmacy)  -discussed with patient plan  of having oncology weigh in for discussion for any  viable treatment options for this patietn  at this point in the disease trajectory vs recommendation for comfort care  -patient requesting to go outside for a bit of fresh air, will discuss with floor director and nursing (later took patient outside myself with awareness of unit director), patient is grateful   Objective: No Known Allergies Scheduled Meds: . dexamethasone  4 mg Oral TID  . feeding supplement (ENSURE COMPLETE)  237 mL Oral TID BM  . fondaparinux (ARIXTRA) injection  5 mg Subcutaneous Q24H  . levETIRAcetam  500 mg Oral BID  . morphine  60 mg Oral Q12H  . pantoprazole  40 mg Oral Daily  . potassium chloride SA  20 mEq Oral Daily   Continuous Infusions: . sodium chloride 75 mL/hr at 04/07/14 0200   PRN Meds:.acetaminophen **OR** acetaminophen, albuterol, HYDROmorphone (DILAUDID) injection, morphine, ondansetron **OR** ondansetron (ZOFRAN) IV, senna-docusate  BP 117/91 mmHg  Pulse 58  Temp(Src) 98.2 F (36.8 C) (Oral)  Resp 16  Ht 5\' 11"  (1.803 m)  Wt 48.1 kg (106 lb 0.7 oz)  BMI 14.80 kg/m2  SpO2 97%   PPS:40 % at best    Intake/Output Summary (Last 24 hours) at 04/07/14 0736 Last data filed at 04/06/14 2351  Gross per 24 hour  Intake 1926.25 ml  Output    880 ml  Net 1046.25 ml        Physical Exam:  General: ill appearing, weak and "tired" HEENT:  moist buccal membranes Chest:    decreased in bases CVS: RRR  Labs: CBC    Component Value Date/Time   WBC 9.2 04/06/2014 0445   WBC 7.4 08/29/2013 1136   RBC 3.98* 04/06/2014 0445   RBC 4.61 08/29/2013 1136   HGB 11.1* 04/06/2014 0445   HGB 13.6 08/29/2013 1136   HCT 34.8* 04/06/2014 0445   HCT 41.4 08/29/2013 1136   PLT 175 04/06/2014 0445   PLT 116* 08/29/2013 1136   MCV 87.4 04/06/2014 0445   MCV 89.8 08/29/2013 1136   MCH 27.9 04/06/2014 0445   MCH 29.6 08/29/2013 1136   MCHC 31.9 04/06/2014 0445   MCHC 33.0 08/29/2013 1136   RDW 16.8* 04/06/2014 0445   RDW 15.9* 08/29/2013 1136   LYMPHSABS 0.8 04/05/2014 1441   LYMPHSABS 0.8* 08/29/2013 1136   MONOABS 0.9 04/05/2014 1441   MONOABS 0.4 08/29/2013 1136   EOSABS 0.0 04/05/2014 1441   EOSABS 0.0 08/29/2013 1136   BASOSABS 0.0 04/05/2014 1441   BASOSABS 0.0 08/29/2013 1136    BMET    Component Value Date/Time   NA 133* 04/06/2014 0445   NA 139 08/29/2013 1136   K 4.3 04/06/2014 0445   K 3.4* 08/29/2013 1136   CL 98 04/06/2014 0445   CL 103 09/02/2012 0955   CO2 24 04/06/2014 0445   CO2 22 08/29/2013 1136   GLUCOSE 95 04/06/2014 0445   GLUCOSE 96 08/29/2013 1136   GLUCOSE 85 09/02/2012 0955  BUN 12 04/06/2014 0445   BUN 22.1 08/29/2013 1136   CREATININE 0.43* 04/06/2014 0445   CREATININE 0.9 08/29/2013 1136   CALCIUM 8.3* 04/06/2014 0445   CALCIUM 9.3 08/29/2013 1136   GFRNONAA >90 04/06/2014 0445   GFRAA >90 04/06/2014 0445    CMP     Component Value Date/Time   NA 133* 04/06/2014 0445   NA 139 08/29/2013 1136   K 4.3 04/06/2014 0445   K 3.4* 08/29/2013 1136   CL 98 04/06/2014 0445   CL 103 09/02/2012 0955   CO2 24 04/06/2014 0445   CO2 22 08/29/2013 1136   GLUCOSE 95 04/06/2014 0445   GLUCOSE 96 08/29/2013 1136   GLUCOSE 85 09/02/2012 0955   BUN 12 04/06/2014 0445   BUN 22.1 08/29/2013 1136   CREATININE 0.43* 04/06/2014 0445   CREATININE 0.9 08/29/2013 1136   CALCIUM 8.3* 04/06/2014 0445   CALCIUM 9.3 08/29/2013 1136    PROT 5.7* 04/06/2014 0445   PROT 6.9 08/29/2013 1136   ALBUMIN 2.6* 04/06/2014 0445   ALBUMIN 3.9 08/29/2013 1136   AST 27 04/06/2014 0445   AST 16 08/29/2013 1136   ALT 22 04/06/2014 0445   ALT 38 08/29/2013 1136   ALKPHOS 236* 04/06/2014 0445   ALKPHOS 89 08/29/2013 1136   BILITOT 0.3 04/06/2014 0445   BILITOT 0.21 08/29/2013 1136   GFRNONAA >90 04/06/2014 0445   GFRAA >90 04/06/2014 0445      Assessment and Plan: 1. Code Status:  Full code       -encouraged to consider DNR status knowing its futility in similar patients   2. Symptom Control:        -dc IV dilaudid       - Morphine ER 50 mg po every 8 hrs scheduled       -Roxanol 15 mg every PO 3 hrs prn 3. Psycho/Social:  Emotional support offered to patient , today is the first time he has verbalized "I know I'm dying".  Continue to create the space and time for patient to verbalize thoughts, feelings and fears regarding his dying process. 4. Spiritual: declines at this time 5. Disposition:  Pending oncology input, home with hospice vs hospice facility    Time In Time Out Total Time Spent with Patient Total Overall Time  0730 0830 60 min 60 min    Greater than 50%  of this time was spent counseling and coordinating care related to the above assessment and plan.  Wadie Lessen NP  Palliative Medicine Team Team Phone # [phone] Pager [phone]   1

## 2014-04-08 ENCOUNTER — Telehealth: Payer: Self-pay | Admitting: *Deleted

## 2014-04-08 ENCOUNTER — Telehealth: Payer: Self-pay

## 2014-04-08 ENCOUNTER — Other Ambulatory Visit: Payer: Self-pay | Admitting: Nurse Practitioner

## 2014-04-08 ENCOUNTER — Telehealth: Payer: Self-pay | Admitting: Oncology

## 2014-04-08 DIAGNOSIS — C7970 Secondary malignant neoplasm of unspecified adrenal gland: Secondary | ICD-10-CM

## 2014-04-08 DIAGNOSIS — Z7189 Other specified counseling: Secondary | ICD-10-CM

## 2014-04-08 DIAGNOSIS — C7951 Secondary malignant neoplasm of bone: Secondary | ICD-10-CM

## 2014-04-08 DIAGNOSIS — C3490 Malignant neoplasm of unspecified part of unspecified bronchus or lung: Secondary | ICD-10-CM

## 2014-04-08 DIAGNOSIS — D630 Anemia in neoplastic disease: Secondary | ICD-10-CM

## 2014-04-08 DIAGNOSIS — C799 Secondary malignant neoplasm of unspecified site: Secondary | ICD-10-CM | POA: Diagnosis present

## 2014-04-08 LAB — GLUCOSE, CAPILLARY: GLUCOSE-CAPILLARY: 91 mg/dL (ref 70–99)

## 2014-04-08 MED ORDER — MORPHINE SULFATE ER 60 MG PO TBCR: 60.0000 mg | EXTENDED_RELEASE_TABLET | Freq: Two times a day (BID) | ORAL | Status: DC

## 2014-04-08 NOTE — Progress Notes (Signed)
Patient discharged yesterday, but stayed the night to arrange for hospice services. Hospice arranged prior to discharge. Patient has no concerns.  Terrance Clark 04/08/2014 3:12 PM

## 2014-04-08 NOTE — Telephone Encounter (Signed)
Call from [facility] with hospice to confirm Dr. Benay Spice will be attending for hospice. YES & OK for hospice MD to assist with symptom management.

## 2014-04-08 NOTE — Telephone Encounter (Signed)
Call from [place], [facility] RN requesting refill on MS Contin and Arixtra. Pt was given 3 day supply of narcotic and 5 day supply of Arixtra when discharged from hospital. Will review with Dr. Benay Spice for refills.

## 2014-04-08 NOTE — Telephone Encounter (Signed)
stacey from hospice called stating pt is planning to discharge home with hospice. They are asking if dr Benay Spice will be the attending and does he want hospice MD to help with symptom management. Her phone is [phone].

## 2014-04-08 NOTE — Care Management Note (Signed)
CARE MANAGEMENT NOTE 04/08/2014  Patient:  [facility]   Account Number:  [id number]  Date Initiated:  04/06/2014  Documentation initiated by:  Marney Doctor  Subjective/Objective Assessment:   38 yo admitted with cancer associated pain     Action/Plan:   From home with spouse   Anticipated DC Date:  04/08/2014   Anticipated DC Plan:  [place]  CM consult      PAC Choice  HOSPICE   Choice offered to / List presented to:  C-1 Patient        HH arranged  HH-10 DISEASE MANAGEMENT      [place]   Status of service:  In process, will continue to follow Medicare Important Message given?   (If response is "NO", the following Medicare IM given date fields will be blank) Date Medicare IM given:   Medicare IM given by:   Date Additional Medicare IM given:   Additional Medicare IM given by:    Discharge Disposition:    Per UR Regulation:  Reviewed for med. necessity/level of care/duration of stay  If discussed at [place] of Stay Meetings, dates discussed:    Comments:  04/08/14 Marney Doctor RN,BSN,NCM Received consult for home hospice.  Pt confirms that he would like to use [facility].  Referral called to [place].  CM will continue to follow and assist with DC as needed.  04/06/14 Marney Doctor RN,BSN,NCM [phone] Was asked to see pt regarding medication needs.  Pt states that he has trouble with someone else picking up his narcotics at the pharmacy for him.  Pt encouraged to call the pharmacy ahead a let them know someone else would be picking them up and to make sure that person has a photo ID to show.  Pt also states that one of his medications had a $68 dollar co-pay this time and has never had a co-pay before.  Pt encouraged to call the number on the back of his insurance card to inquire about what his medication co-pay should be.  Pt also states that he does not have Medicaid even though  it is listed on his facesheet.  CM will continue to follow.

## 2014-04-08 NOTE — Progress Notes (Signed)
Patient d/c home. Stable.Sandie Ano RN

## 2014-04-08 NOTE — Progress Notes (Signed)
Patient's d/c instructions given to patient and he verbalized understanding,teach back utilized,family members at bedside. Stable ,pain is controlled,prescriptions given to family.Sandie Ano RN
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
## 2014-04-08 NOTE — Telephone Encounter (Signed)
S/w pt confirming ML visit per 01/27 POF. Mailed out sch to pt... KJ

## 2014-04-08 NOTE — Progress Notes (Signed)
Progress Note from the Palliative Medicine Team at [facility] and Plan:  -meet with patient and SO [name] at bedside, patient is hopeful to discharge home today with hospice services, will write for choice  -was present in room with Dr Benay Spice for conversation regarding oncology options, patient is aware that at this time there are no systemic therapies being offered, Dr Benay Spice  to make an OP f/u visit for patient in 2 weeks, Dr Benay Spice agrees to be the attending and supports hospice referral (prognosis is clearly less that six months, likely much less that that time frame with extent and progression of disease)  -continued conversation regarding diagnosis, prognosis, GOC --patient is educated on the importance of continued conversation and documentation of  his advanced directives  -patient reports his pain is under control with present medication regime--Discussed with Dr Rockne Menghini      Objective: No Known Allergies Scheduled Meds: . dexamethasone  4 mg Oral TID  . feeding supplement (ENSURE COMPLETE)  237 mL Oral TID BM  . fondaparinux (ARIXTRA) injection  5 mg Subcutaneous Q24H  . levETIRAcetam  500 mg Oral BID  . morphine  45 mg Oral 3 times per day  . pantoprazole  40 mg Oral Daily  . potassium chloride SA  20 mEq Oral Daily   Continuous Infusions: . sodium chloride 75 mL/hr at 04/08/14 0354   PRN Meds:.acetaminophen **OR** acetaminophen, albuterol, bisacodyl, morphine CONCENTRATE, ondansetron **OR** ondansetron (ZOFRAN) IV, senna-docusate  BP 120/89 mmHg  Pulse 84  Temp(Src) 98.2 F (36.8 C) (Oral)  Resp 18  Ht 5\' 11"  (1.803 m)  Wt 48.1 kg (106 lb 0.7 oz)  BMI 14.80 kg/m2  SpO2 99%   PPS:40 % at best    Intake/Output Summary (Last 24 hours) at 04/08/14 0824 Last data filed at 04/08/14 0547  Gross per 24 hour  Intake   2690 ml  Output   1200 ml  Net   1490 ml        Physical Exam:  General: ill appearing, weak and "tired" HEENT:  moist buccal  membranes Chest:   decreased in bases CVS: RRR Extrem: generalized weakness, Left leg weakness > than right  Labs: CBC    Component Value Date/Time   WBC 9.2 04/06/2014 0445   WBC 7.4 08/29/2013 1136   RBC 3.98* 04/06/2014 0445   RBC 4.61 08/29/2013 1136   HGB 11.1* 04/06/2014 0445   HGB 13.6 08/29/2013 1136   HCT 34.8* 04/06/2014 0445   HCT 41.4 08/29/2013 1136   PLT 175 04/06/2014 0445   PLT 116* 08/29/2013 1136   MCV 87.4 04/06/2014 0445   MCV 89.8 08/29/2013 1136   MCH 27.9 04/06/2014 0445   MCH 29.6 08/29/2013 1136   MCHC 31.9 04/06/2014 0445   MCHC 33.0 08/29/2013 1136   RDW 16.8* 04/06/2014 0445   RDW 15.9* 08/29/2013 1136   LYMPHSABS 0.8 04/05/2014 1441   LYMPHSABS 0.8* 08/29/2013 1136   MONOABS 0.9 04/05/2014 1441   MONOABS 0.4 08/29/2013 1136   EOSABS 0.0 04/05/2014 1441   EOSABS 0.0 08/29/2013 1136   BASOSABS 0.0 04/05/2014 1441   BASOSABS 0.0 08/29/2013 1136    BMET    Component Value Date/Time   NA 133* 04/06/2014 0445   NA 139 08/29/2013 1136   K 4.3 04/06/2014 0445   K 3.4* 08/29/2013 1136   CL 98 04/06/2014 0445   CL 103 09/02/2012 0955   CO2 24 04/06/2014 0445   CO2 22 08/29/2013 1136  GLUCOSE 95 04/06/2014 0445   GLUCOSE 96 08/29/2013 1136   GLUCOSE 85 09/02/2012 0955   BUN 12 04/06/2014 0445   BUN 22.1 08/29/2013 1136   CREATININE 0.43* 04/06/2014 0445   CREATININE 0.9 08/29/2013 1136   CALCIUM 8.3* 04/06/2014 0445   CALCIUM 9.3 08/29/2013 1136   GFRNONAA >90 04/06/2014 0445   GFRAA >90 04/06/2014 0445    CMP     Component Value Date/Time   NA 133* 04/06/2014 0445   NA 139 08/29/2013 1136   K 4.3 04/06/2014 0445   K 3.4* 08/29/2013 1136   CL 98 04/06/2014 0445   CL 103 09/02/2012 0955   CO2 24 04/06/2014 0445   CO2 22 08/29/2013 1136   GLUCOSE 95 04/06/2014 0445   GLUCOSE 96 08/29/2013 1136   GLUCOSE 85 09/02/2012 0955   BUN 12 04/06/2014 0445   BUN 22.1 08/29/2013 1136   CREATININE 0.43* 04/06/2014 0445   CREATININE  0.9 08/29/2013 1136   CALCIUM 8.3* 04/06/2014 0445   CALCIUM 9.3 08/29/2013 1136   PROT 5.7* 04/06/2014 0445   PROT 6.9 08/29/2013 1136   ALBUMIN 2.6* 04/06/2014 0445   ALBUMIN 3.9 08/29/2013 1136   AST 27 04/06/2014 0445   AST 16 08/29/2013 1136   ALT 22 04/06/2014 0445   ALT 38 08/29/2013 1136   ALKPHOS 236* 04/06/2014 0445   ALKPHOS 89 08/29/2013 1136   BILITOT 0.3 04/06/2014 0445   BILITOT 0.21 08/29/2013 1136   GFRNONAA >90 04/06/2014 0445   GFRAA >90 04/06/2014 0445      Assessment and Plan: 1. Code Status:  Full code       -encouraged to consider DNR status knowing its futility in similar patients   2. Symptom Control:       Continue       - Morphine ER 50 mg po every 8 hrs scheduled       -Roxanol 15 mg every PO 3 hrs prn  3. Psycho/Social:  Continued emotional support offered to patient and his SO [name] , 4. Spiritual: Chaplain consulted 5. Disposition: Hopeful for home with hospice service will write for choice   Time In Time Out Total Time Spent with Patient Total Overall Time  0840 0930 50 min 50 min    Greater than 50%  of this time was spent counseling and coordinating care related to the above assessment and plan.  Wadie Lessen NP  Palliative Medicine Team Team Phone # [phone] Pager [phone]   1

## 2014-04-09 MED ORDER — FONDAPARINUX SODIUM 5 MG/0.4ML ~~LOC~~ SOLN: 5.0000 mg | SUBCUTANEOUS | Status: AC

## 2014-04-09 MED ORDER — MORPHINE SULFATE ER 60 MG PO TBCR: 60.0000 mg | EXTENDED_RELEASE_TABLET | Freq: Two times a day (BID) | ORAL | Status: AC

## 2014-04-09 NOTE — Telephone Encounter (Signed)
Refilled MS Contin and Arixtra for 2 weeks supply. Arixtra dose adjusted according to weight. MS Contin Rx faxed.

## 2014-04-20 ENCOUNTER — Telehealth: Payer: Self-pay | Admitting: *Deleted

## 2014-04-20 NOTE — Telephone Encounter (Signed)
Message from [place], [place] with hospice: pt is requesting medication to stimulate appetite. He continues Decadron 4 mg BID. Lattie Haw denies pt has any thrush. She is also requesting refill on pt's Roxanol. Will review with Dr. Benay Spice.  Per Lattie Haw, hospice has refilled pt's Arixtra and started him on Senna for constipation. BP was 88/58 today. She reports he seems to be declining.

## 2014-04-21 ENCOUNTER — Other Ambulatory Visit: Payer: Self-pay | Admitting: *Deleted

## 2014-04-21 MED ORDER — MORPHINE SULFATE (CONCENTRATE) 10 MG/0.5ML PO SOLN: 15.0000 mg | ORAL | Status: AC | PRN

## 2014-04-21 NOTE — Telephone Encounter (Signed)
Call from [place], [place] with hospice again reporting pt is requesting IV fluids. Pt does not want fluids today, requesting to have infusion next week. She will remind pt of office visit 2/12. She reports pt spends most of his time in [place]. Lattie Haw asks if pt is going to continue Arixtra? Informed her anticoagulation be addressed during office visit.  Roxanol Rx faxed to [facility].

## 2014-04-24 ENCOUNTER — Ambulatory Visit (HOSPITAL_BASED_OUTPATIENT_CLINIC_OR_DEPARTMENT_OTHER): Payer: 59

## 2014-04-24 ENCOUNTER — Telehealth: Payer: Self-pay | Admitting: *Deleted

## 2014-04-24 ENCOUNTER — Encounter: Payer: Self-pay | Admitting: *Deleted

## 2014-04-24 ENCOUNTER — Ambulatory Visit (HOSPITAL_BASED_OUTPATIENT_CLINIC_OR_DEPARTMENT_OTHER): Payer: 59 | Admitting: Nurse Practitioner

## 2014-04-24 VITALS — BP 98/64 | HR 98 | Temp 98.5°F | Resp 20

## 2014-04-24 VITALS — BP 102/67 | HR 117 | Temp 97.5°F | Resp 20 | Ht 71.0 in

## 2014-04-24 DIAGNOSIS — C770 Secondary and unspecified malignant neoplasm of lymph nodes of head, face and neck: Secondary | ICD-10-CM

## 2014-04-24 DIAGNOSIS — R634 Abnormal weight loss: Secondary | ICD-10-CM

## 2014-04-24 DIAGNOSIS — R569 Unspecified convulsions: Secondary | ICD-10-CM

## 2014-04-24 DIAGNOSIS — R531 Weakness: Secondary | ICD-10-CM

## 2014-04-24 DIAGNOSIS — C7931 Secondary malignant neoplasm of brain: Secondary | ICD-10-CM

## 2014-04-24 DIAGNOSIS — C3490 Malignant neoplasm of unspecified part of unspecified bronchus or lung: Secondary | ICD-10-CM

## 2014-04-24 DIAGNOSIS — R630 Anorexia: Secondary | ICD-10-CM

## 2014-04-24 DIAGNOSIS — C7949 Secondary malignant neoplasm of other parts of nervous system: Secondary | ICD-10-CM

## 2014-04-24 DIAGNOSIS — C799 Secondary malignant neoplasm of unspecified site: Secondary | ICD-10-CM

## 2014-04-24 MED ORDER — PROCHLORPERAZINE EDISYLATE 5 MG/ML IJ SOLN: 10.0000 mg | Freq: Once | INTRAMUSCULAR | Status: DC

## 2014-04-24 MED ORDER — PROCHLORPERAZINE EDISYLATE 5 MG/ML IJ SOLN
5.0000 mg | Freq: Once | INTRAMUSCULAR | Status: AC
  Administered 2014-04-24: 5 mg via INTRAVENOUS

## 2014-04-24 MED ORDER — PROCHLORPERAZINE EDISYLATE 5 MG/ML IJ SOLN
INTRAMUSCULAR | Status: AC
  Filled 2014-04-24: qty 2

## 2014-04-24 MED ORDER — SODIUM CHLORIDE 0.9 % IV SOLN
1000.0000 mL | Freq: Once | INTRAVENOUS | Status: AC
  Administered 2014-04-24: 1000 mL via INTRAVENOUS

## 2014-04-24 MED ORDER — PROCHLORPERAZINE MALEATE 5 MG PO TABS: 5.0000 mg | ORAL_TABLET | Freq: Four times a day (QID) | ORAL | Status: AC | PRN

## 2014-04-24 NOTE — Telephone Encounter (Signed)
Message from [place] with hospice reporting pt is being discharged from [facility] [place]. [facility] is [facility] pt. They will call in the AM to arrange visit.

## 2014-04-24 NOTE — Progress Notes (Signed)
Patient blood pressure checked before administration of compazine. Blood pressure noted to be 92/71. Ned Card, NP notified. Order given and carried out for compazine 5 mg IV. Will check patient blood pressure after compazine infuses.

## 2014-04-24 NOTE — Progress Notes (Signed)
[place] Clinical Social Work  Clinical Social Worker, Polo Riley  met with patient at [facility] to offer support and assess for needs.  CSW working with hospice, CSW to assist with transfer to hospice in [place]. CSW left hospice CSW, Anthoney Harada message re. This plan. CSW also reached out to [place], [place] re. SSI. Mrs Claiborne Billings returned call to this CSW. Per Mrs Claiborne Billings, pt has open SSI case and needs to update address with [employer]SA in order to get benefits. Polo Riley to contact with updated address early next week.   Clinical Social Work interventions: Resource coordination  Loren Racer, [place] Worker [place]  [place] Phone: [phone] Fax: [phone]

## 2014-04-24 NOTE — Progress Notes (Addendum)
  [facility] OFFICE PROGRESS NOTE   Diagnosis:  Non-small cell lung cancer  INTERVAL HISTORY:   Mr. Paredez returns as scheduled. He is followed by hospice. He reports a poor appetite. He has intermittent nausea. He reports his pain is well-controlled with MS Contin and liquid morphine. He has intermittent constipation. He feels weak. He would like to receive IV fluids today.  Objective:  Vital signs in last 24 hours:  Blood pressure 102/67, pulse 117, temperature 97.5 F (36.4 C), temperature source Oral, resp. rate 20, height 5\' 11"  (1.803 m), weight 0 lb (0 kg).    HEENT: No thrush or ulcers. Resp: Breath sounds are distant. No respiratory distress. Cardio: Heart is regular, tachycardic. GI: Abdomen appears mildly distended. Generalized mild tenderness. Vascular: No leg edema. Neuro: Moves all extremities.  Skin: Decreased skin turgor.    Lab Results:  Lab Results  Component Value Date   WBC 9.2 04/06/2014   HGB 11.1* 04/06/2014   HCT 34.8* 04/06/2014   MCV 87.4 04/06/2014   PLT 175 04/06/2014   NEUTROABS 7.0 04/05/2014    Imaging:  No results found.  Medications: I have reviewed the patient's current medications.  Assessment/Plan: 1. Widespread metastatic non-small cell lung cancer. 2. Anorexia/weight loss secondary to #1. 3. Pain secondary to #1. 4. 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 5. Seizures December 2015. Maintained on Keppra.   Disposition: Mr. Mcmath has widespread metastatic non-small cell lung cancer. His performance status continues to decline. He is followed by hospice.  He will be residing in [place] at least intermittently with his brother. Our social worker is making contact with the local [place] hospice program.  Dr. Benay Spice discussed CODE STATUS with Mr. Bissonnette. He is not ready to make a decision.  At his request we did not schedule a formal follow-up visit. We conveyed to Mr. Borello  and his brother that we would be available to see him should any problems arise.  Patient seen with Dr. Benay Spice.     Ned Card ANP/GNP-BC   04/24/2014  11:51 AM This was a shared visit with Ned Card. Mr. Hinojosa will continue Hospice care in [place] or [place].  We are available to see him as needed.  Julieanne Manson, MD

## 2014-04-24 NOTE — Patient Instructions (Signed)
Dehydration, Adult Dehydration is when you lose more fluids from the body than you take in. Vital organs like the kidneys, brain, and heart cannot function without a proper amount of fluids and salt. Any loss of fluids from the body can cause dehydration.  CAUSES   Vomiting.  Diarrhea.  Excessive sweating.  Excessive urine output.  Fever. SYMPTOMS  Mild dehydration  Thirst.  Dry lips.  Slightly dry mouth. Moderate dehydration  Very dry mouth.  Sunken eyes.  Skin does not bounce back quickly when lightly pinched and released.  Dark urine and decreased urine production.  Decreased tear production.  Headache. Severe dehydration  Very dry mouth.  Extreme thirst.  Rapid, weak pulse (more than 100 beats per minute at rest).  Cold hands and feet.  Not able to sweat in spite of heat and temperature.  Rapid breathing.  Blue lips.  Confusion and lethargy.  Difficulty being awakened.  Minimal urine production.  No tears. DIAGNOSIS  Your caregiver will diagnose dehydration based on your symptoms and your exam. Blood and urine tests will help confirm the diagnosis. The diagnostic evaluation should also identify the cause of dehydration. TREATMENT  Treatment of mild or moderate dehydration can often be done at home by increasing the amount of fluids that you drink. It is best to drink small amounts of fluid more often. Drinking too much at one time can make vomiting worse. Refer to the home care instructions below. Severe dehydration needs to be treated at the hospital where you will probably be given intravenous (IV) fluids that contain water and electrolytes. HOME CARE INSTRUCTIONS   Ask your caregiver about specific rehydration instructions.  Drink enough fluids to keep your urine clear or pale yellow.  Drink small amounts frequently if you have nausea and vomiting.  Eat as you normally do.  Avoid:  Foods or drinks high in sugar.  Carbonated  drinks.  Juice.  Extremely hot or cold fluids.  Drinks with caffeine.  Fatty, greasy foods.  Alcohol.  Tobacco.  Overeating.  Gelatin desserts.  Wash your hands well to avoid spreading bacteria and viruses.  Only take over-the-counter or prescription medicines for pain, discomfort, or fever as directed by your caregiver.  Ask your caregiver if you should continue all prescribed and over-the-counter medicines.  Keep all follow-up appointments with your caregiver. SEEK MEDICAL CARE IF:  You have abdominal pain and it increases or stays in one area (localizes).  You have a rash, stiff neck, or severe headache.  You are irritable, sleepy, or difficult to awaken.  You are weak, dizzy, or extremely thirsty. SEEK IMMEDIATE MEDICAL CARE IF:   You are unable to keep fluids down or you get worse despite treatment.  You have frequent episodes of vomiting or diarrhea.  You have blood or green matter (bile) in your vomit.  You have blood in your stool or your stool looks black and tarry.  You have not urinated in 6 to 8 hours, or you have only urinated a small amount of very dark urine.  You have a fever.  You faint. MAKE SURE YOU:   Understand these instructions.  Will watch your condition.  Will get help right away if you are not doing well or get worse. Document Released: 02/27/2005 Document Revised: 05/22/2011 Document Reviewed: 10/17/2010 [employer] Patient Information 2015 [employer]. This information is not intended to replace advice given to you by your health care provider. Make sure you discuss any questions you have with your health care   provider.  

## 2014-05-12 DEATH — deceased

## 2016-01-20 IMAGING — MR MR HEAD WO/W CM
7 of 11 series · 23 of 48 positions shown · IV contrast (Yes)
Comparison: 01/21/2013 and multiple previous

CLINICAL DATA: S RS restaging.

EXAM:
MRI HEAD WITHOUT AND WITH CONTRAST
TECHNIQUE: Multiplanar, multiecho pulse sequences of the brain and surrounding
structures were obtained without and with intravenous contrast.
CONTRAST:  13mL MULTIHANCE GADOBENATE DIMEGLUMINE 529 MG/ML IV SOLN

[Series 4: DWI · axial · 5.0mm · 1.09mm/px · z∈[-63,+107]mm · 5 of 64 slices shown (1 of 2)]
[im 1/64]
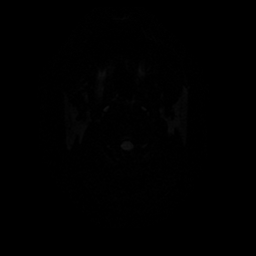
[im 16/64]
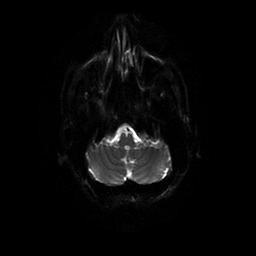
[im 32/64]
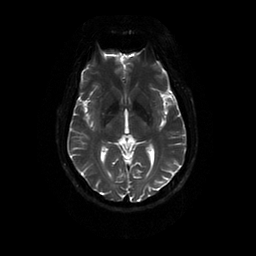
[im 48/64]
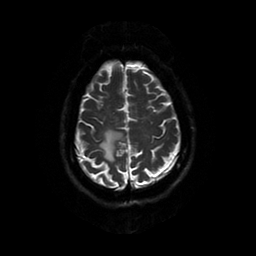
[im 64/64]
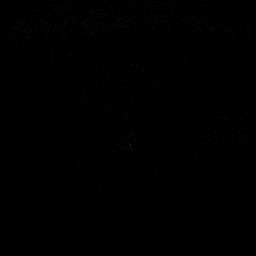

[Series 7: FLAIR · sagittal · 3.0mm · 0.47mm/px · 3 of 40 slices shown (1 of 2)]
[im 1/40]
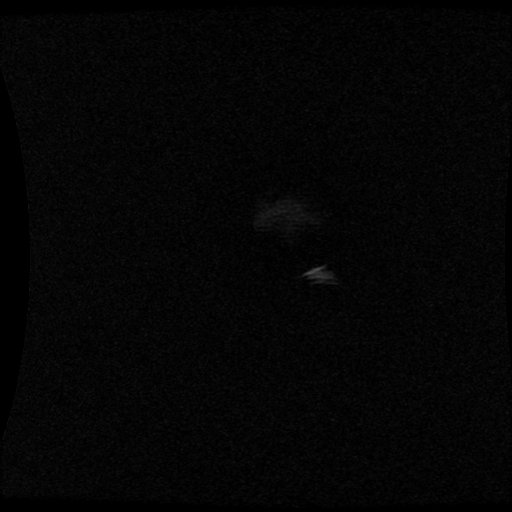
[im 20/40]
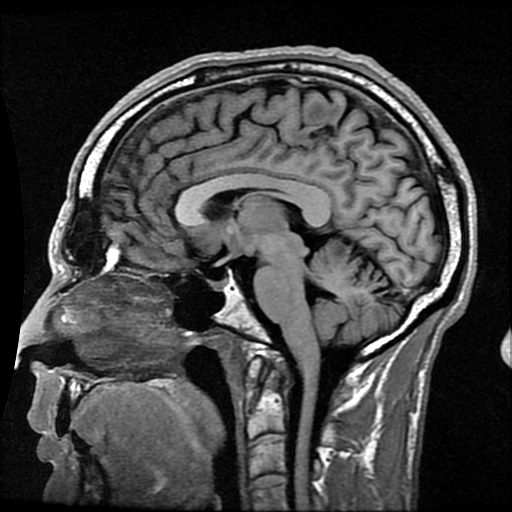
[im 40/40]
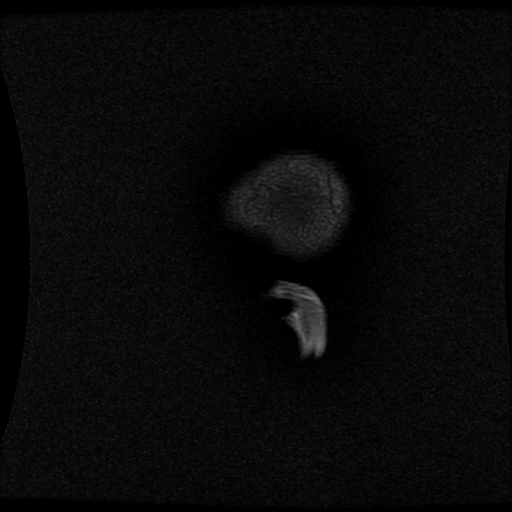

[Series 8: FLAIR · axial · 1.0mm · 0.43mm/px · z∈[-54,+104]mm · 5 of 80 slices shown (2 of 2)]
[im 1/80]
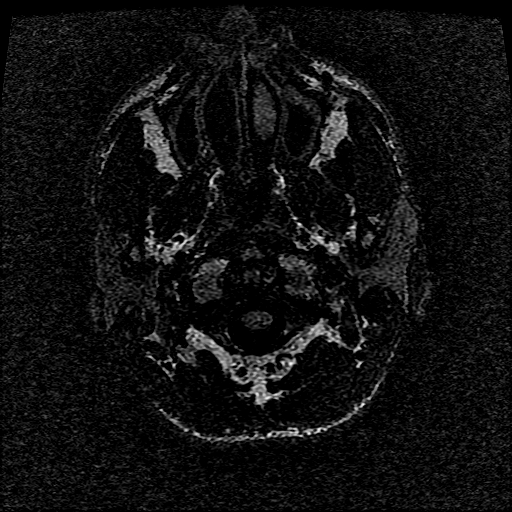
[im 20/80]
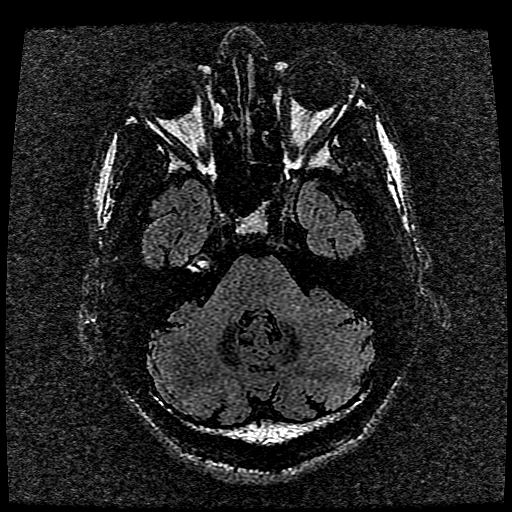
[im 40/80]
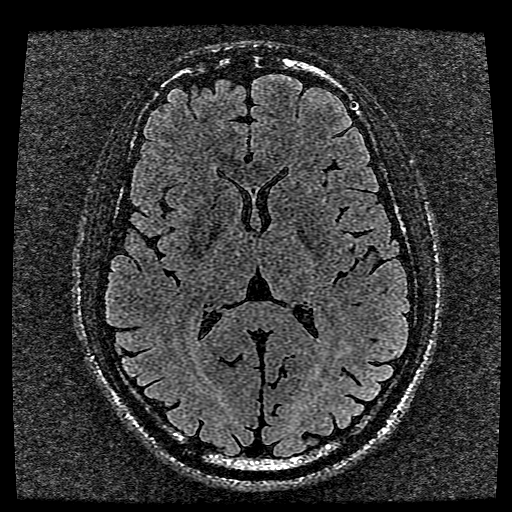
[im 60/80]
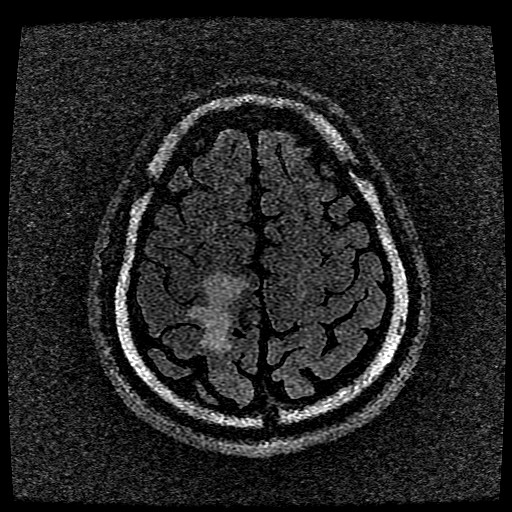
[im 80/80]
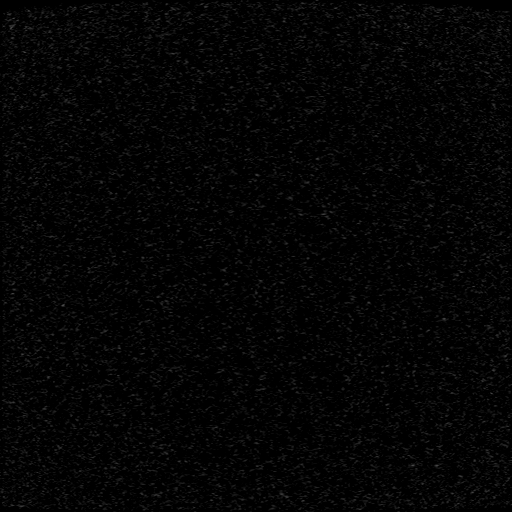

[Series 10: T2 post-contrast · coronal · 3.0mm · 0.43mm/px · 3 of 48 slices shown]
[im 1/48]
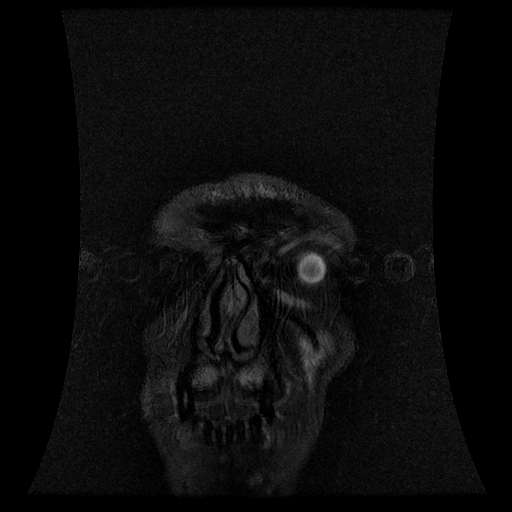
[im 24/48]
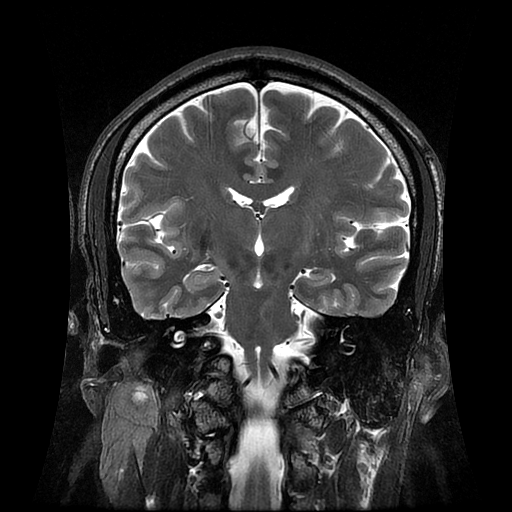
[im 48/48]
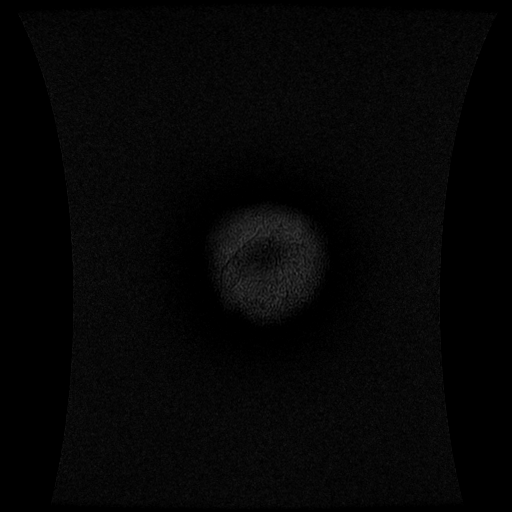

[Series 12: T1 · coronal · 3.0mm · 0.43mm/px · 2 of 48 slices shown]
[im 1/48]
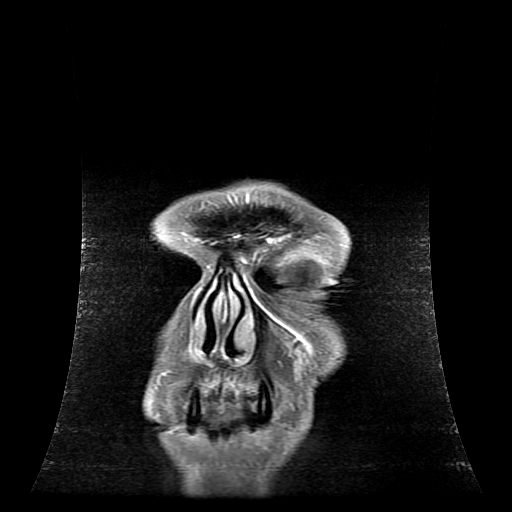
[im 24/48]
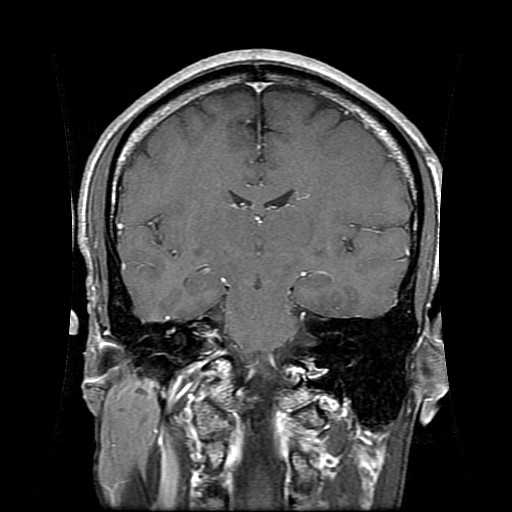

[Series 13: T1 post-contrast · sagittal · 3.0mm · 0.47mm/px · 3 of 40 slices shown]
[im 1/40]
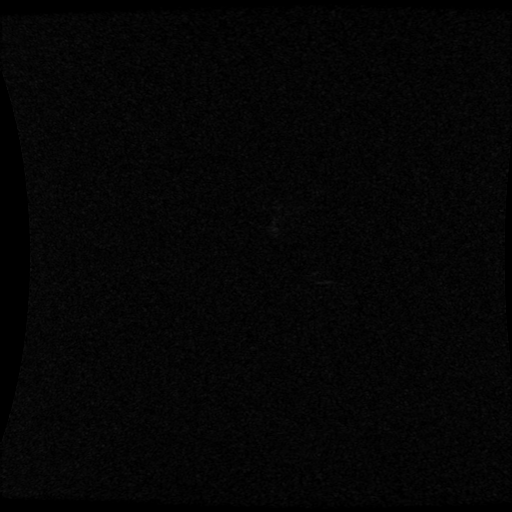
[im 20/40]
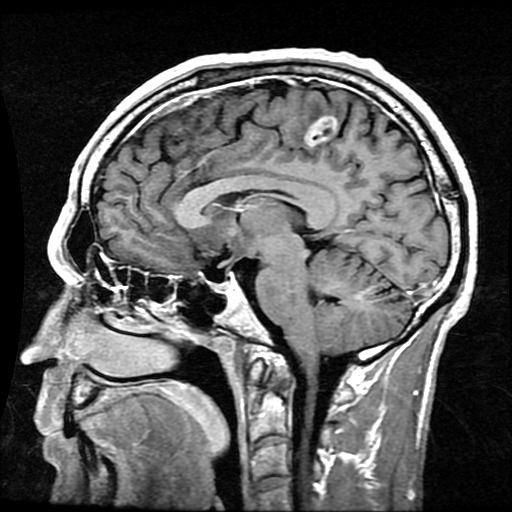
[im 40/40]
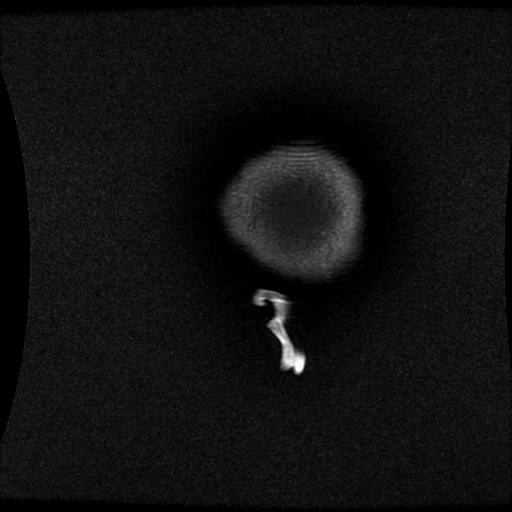

[Series 400: DWI · axial · 5.0mm · 1.09mm/px · z∈[-63,+102]mm · 2 of 31 slices shown (2 of 2)]
[im 1/31]
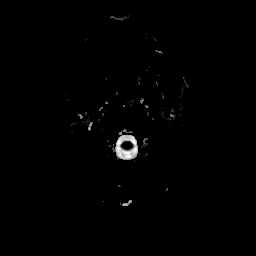
[im 31/31]
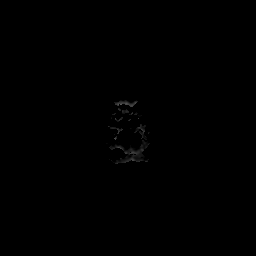

[23 of 48 positions shown; findings below may reference images not displayed]

FINDINGS: There is an enlarging metastasis at the cerebellar vermis measuring
4 mm axial image 32.

There is a new 2 mm metastasis in the left occipital lobe axial
image 49.

There are 2 new 3 mm metastases in the left temporal lobe also both
seen on image 49.

There is a new 1 mm metastasis in the medial left parietal lobe
image 93.

Multiple other punctate metastases scattered throughout both
cerebral hemispheres as previously described are the same or
smaller.

A right frontal metastasis with some internal hemorrhage previously
measured at 11 mm is smaller, measuring only 7 mm.

At the right parietal vertex, there is a enlargement of the
previously seen peripherally enhancing lesion, with more surrounding
vasogenic edema. Previously this measured 16 x 13 mm. Today it
measures 18 x 14 mm. Previously, there has been discussion if this
could represent post treatment effect versus progression of tumor. I
think this remains questionable.

No evidence of ischemic infarction, hydrocephalus or extra-axial
fluid collection. No skull or skullbase metastasis is identified.
IMPRESSION: Five new metastatic lesions identified affecting the cerebellar
vermis, the left occipital lobe, the left temporal lobe in the left
parietal lobe.

Slight enlargement of a peripheral enhancing lesion at the medial
right parietal vertex. More regional vasogenic edema. This lesion
remains indeterminate for a viable tumor versus treatment effect at
this point.

Multiple other previously described brain lesions are all unchanged
or smaller.
